# Patient Record
Sex: Male | Born: 1935 | Race: Black or African American | Hispanic: No | Smoking: Never smoker
Health system: Southern US, Community
[De-identification: ages and names within clinical notes are randomized; demographics above are authoritative.]

## PROBLEM LIST (undated history)

## (undated) DIAGNOSIS — N189 Chronic kidney disease, unspecified: Secondary | ICD-10-CM

## (undated) DIAGNOSIS — C7951 Secondary malignant neoplasm of bone: Secondary | ICD-10-CM

## (undated) DIAGNOSIS — E7800 Pure hypercholesterolemia, unspecified: Secondary | ICD-10-CM

## (undated) DIAGNOSIS — E079 Disorder of thyroid, unspecified: Secondary | ICD-10-CM

## (undated) DIAGNOSIS — I639 Cerebral infarction, unspecified: Secondary | ICD-10-CM

## (undated) DIAGNOSIS — G4730 Sleep apnea, unspecified: Secondary | ICD-10-CM

## (undated) DIAGNOSIS — K219 Gastro-esophageal reflux disease without esophagitis: Secondary | ICD-10-CM

## (undated) DIAGNOSIS — M1990 Unspecified osteoarthritis, unspecified site: Secondary | ICD-10-CM

## (undated) DIAGNOSIS — I10 Essential (primary) hypertension: Secondary | ICD-10-CM

## (undated) DIAGNOSIS — I509 Heart failure, unspecified: Secondary | ICD-10-CM

## (undated) DIAGNOSIS — G459 Transient cerebral ischemic attack, unspecified: Secondary | ICD-10-CM

## (undated) DIAGNOSIS — F0390 Unspecified dementia without behavioral disturbance: Secondary | ICD-10-CM

## (undated) DIAGNOSIS — E039 Hypothyroidism, unspecified: Secondary | ICD-10-CM

## (undated) DIAGNOSIS — Z8719 Personal history of other diseases of the digestive system: Secondary | ICD-10-CM

## (undated) DIAGNOSIS — C61 Malignant neoplasm of prostate: Secondary | ICD-10-CM

## (undated) DIAGNOSIS — R569 Unspecified convulsions: Secondary | ICD-10-CM

## (undated) HISTORY — PX: INGUINAL HERNIA REPAIR: SUR1180

## (undated) HISTORY — PX: TRANSURETHRAL RESECTION OF PROSTATE: SHX73

## (undated) HISTORY — DX: Disorder of thyroid, unspecified: E07.9

## (undated) HISTORY — DX: Essential (primary) hypertension: I10

## (undated) HISTORY — PX: COLONOSCOPY: SHX174

## (undated) HISTORY — PX: CERVICAL DISC SURGERY: SHX588

## (undated) HISTORY — PX: BACK SURGERY: SHX140

## (undated) HISTORY — PX: CATARACT EXTRACTION W/ INTRAOCULAR LENS  IMPLANT, BILATERAL: SHX1307

---

## 1999-01-16 ENCOUNTER — Encounter: Admission: RE | Admit: 1999-01-16 | Discharge: 1999-04-16 | Payer: Self-pay | Admitting: Radiation Oncology

## 2005-03-26 ENCOUNTER — Emergency Department (HOSPITAL_COMMUNITY): Admission: EM | Admit: 2005-03-26 | Discharge: 2005-03-27 | Payer: Self-pay | Admitting: Emergency Medicine

## 2006-11-07 ENCOUNTER — Ambulatory Visit: Payer: Self-pay | Admitting: Gastroenterology

## 2006-11-08 ENCOUNTER — Ambulatory Visit (HOSPITAL_COMMUNITY): Admission: RE | Admit: 2006-11-08 | Discharge: 2006-11-08 | Payer: Self-pay | Admitting: Gastroenterology

## 2007-03-06 ENCOUNTER — Encounter (INDEPENDENT_AMBULATORY_CARE_PROVIDER_SITE_OTHER): Payer: Self-pay | Admitting: Internal Medicine

## 2007-05-16 ENCOUNTER — Encounter (INDEPENDENT_AMBULATORY_CARE_PROVIDER_SITE_OTHER): Payer: Self-pay | Admitting: Internal Medicine

## 2007-06-25 ENCOUNTER — Telehealth (INDEPENDENT_AMBULATORY_CARE_PROVIDER_SITE_OTHER): Payer: Self-pay | Admitting: *Deleted

## 2007-06-25 ENCOUNTER — Ambulatory Visit: Payer: Self-pay | Admitting: Internal Medicine

## 2007-06-25 DIAGNOSIS — Z8546 Personal history of malignant neoplasm of prostate: Secondary | ICD-10-CM | POA: Insufficient documentation

## 2007-06-25 DIAGNOSIS — H269 Unspecified cataract: Secondary | ICD-10-CM

## 2007-06-25 DIAGNOSIS — J309 Allergic rhinitis, unspecified: Secondary | ICD-10-CM | POA: Insufficient documentation

## 2007-06-25 DIAGNOSIS — M129 Arthropathy, unspecified: Secondary | ICD-10-CM | POA: Insufficient documentation

## 2007-06-25 DIAGNOSIS — R569 Unspecified convulsions: Secondary | ICD-10-CM

## 2007-06-25 DIAGNOSIS — I10 Essential (primary) hypertension: Secondary | ICD-10-CM

## 2007-06-25 DIAGNOSIS — E039 Hypothyroidism, unspecified: Secondary | ICD-10-CM

## 2007-06-27 ENCOUNTER — Encounter (INDEPENDENT_AMBULATORY_CARE_PROVIDER_SITE_OTHER): Payer: Self-pay | Admitting: Internal Medicine

## 2007-07-08 ENCOUNTER — Encounter (INDEPENDENT_AMBULATORY_CARE_PROVIDER_SITE_OTHER): Payer: Self-pay | Admitting: Internal Medicine

## 2007-07-14 ENCOUNTER — Ambulatory Visit: Payer: Self-pay | Admitting: Internal Medicine

## 2007-08-08 ENCOUNTER — Ambulatory Visit (HOSPITAL_COMMUNITY): Admission: RE | Admit: 2007-08-08 | Discharge: 2007-08-08 | Payer: Self-pay | Admitting: Internal Medicine

## 2007-08-08 ENCOUNTER — Ambulatory Visit: Payer: Self-pay | Admitting: Internal Medicine

## 2007-08-08 DIAGNOSIS — R05 Cough: Secondary | ICD-10-CM

## 2007-08-12 ENCOUNTER — Encounter (INDEPENDENT_AMBULATORY_CARE_PROVIDER_SITE_OTHER): Payer: Self-pay | Admitting: Internal Medicine

## 2007-09-05 ENCOUNTER — Ambulatory Visit: Payer: Self-pay | Admitting: Internal Medicine

## 2007-10-17 ENCOUNTER — Ambulatory Visit: Payer: Self-pay | Admitting: Internal Medicine

## 2007-10-17 LAB — CONVERTED CEMR LAB
Bilirubin Urine: NEGATIVE
Blood in Urine, dipstick: NEGATIVE
Glucose, Urine, Semiquant: NEGATIVE
Protein, U semiquant: NEGATIVE
WBC Urine, dipstick: NEGATIVE
pH: 7

## 2007-10-19 LAB — CONVERTED CEMR LAB
CO2: 23 meq/L (ref 19–32)
Chloride: 106 meq/L (ref 96–112)
HDL: 43 mg/dL (ref >39)
LDL Cholesterol: 81 mg/dL (ref 0–99)
Potassium: 3.6 meq/L (ref 3.5–5.3)
Sodium: 143 meq/L (ref 135–145)
TSH: 0.103 microintl units/mL — ABNORMAL LOW (ref 0.350–5.50)
Total CHOL/HDL Ratio: 3.8
VLDL: 40 mg/dL (ref 0–40)

## 2007-12-10 ENCOUNTER — Encounter (INDEPENDENT_AMBULATORY_CARE_PROVIDER_SITE_OTHER): Payer: Self-pay | Admitting: Internal Medicine

## 2007-12-11 ENCOUNTER — Encounter (INDEPENDENT_AMBULATORY_CARE_PROVIDER_SITE_OTHER): Payer: Self-pay | Admitting: Internal Medicine

## 2007-12-19 ENCOUNTER — Encounter (INDEPENDENT_AMBULATORY_CARE_PROVIDER_SITE_OTHER): Payer: Self-pay | Admitting: Internal Medicine

## 2008-01-06 ENCOUNTER — Ambulatory Visit: Payer: Self-pay | Admitting: Internal Medicine

## 2008-01-06 DIAGNOSIS — L259 Unspecified contact dermatitis, unspecified cause: Secondary | ICD-10-CM

## 2008-02-25 ENCOUNTER — Ambulatory Visit: Payer: Self-pay | Admitting: Internal Medicine

## 2008-04-02 ENCOUNTER — Encounter (INDEPENDENT_AMBULATORY_CARE_PROVIDER_SITE_OTHER): Payer: Self-pay | Admitting: Internal Medicine

## 2008-05-25 ENCOUNTER — Ambulatory Visit: Payer: Self-pay | Admitting: Internal Medicine

## 2008-07-06 ENCOUNTER — Encounter (INDEPENDENT_AMBULATORY_CARE_PROVIDER_SITE_OTHER): Payer: Self-pay | Admitting: Internal Medicine

## 2008-08-24 ENCOUNTER — Ambulatory Visit: Payer: Self-pay | Admitting: Internal Medicine

## 2008-08-26 LAB — CONVERTED CEMR LAB
AST: 18 units/L (ref 0–37)
Albumin: 4.5 g/dL (ref 3.5–5.2)
Alkaline Phosphatase: 101 units/L (ref 39–117)
BUN: 14 mg/dL (ref 6–23)
Basophils Absolute: 0 10*3/uL (ref 0.0–0.1)
Basophils Relative: 1 % (ref 0–1)
HDL: 45 mg/dL (ref >39)
LDL Cholesterol: 86 mg/dL (ref 0–99)
Lymphocytes Relative: 45 % (ref 12–46)
Neutro Abs: 2.2 10*3/uL (ref 1.7–7.7)
Platelets: 183 10*3/uL (ref 150–400)
Potassium: 3.7 meq/L (ref 3.5–5.3)
RDW: 14 % (ref 11.5–15.5)
Sodium: 141 meq/L (ref 135–145)
TSH: 0.184 microintl units/mL — ABNORMAL LOW (ref 0.350–4.500)
Total Bilirubin: 0.8 mg/dL (ref 0.3–1.2)
VLDL: 34 mg/dL (ref 0–40)

## 2009-02-11 ENCOUNTER — Encounter (INDEPENDENT_AMBULATORY_CARE_PROVIDER_SITE_OTHER): Payer: Self-pay | Admitting: Internal Medicine

## 2009-09-04 ENCOUNTER — Emergency Department (HOSPITAL_COMMUNITY): Admission: EM | Admit: 2009-09-04 | Discharge: 2009-09-04 | Payer: Self-pay | Admitting: Emergency Medicine

## 2010-10-17 NOTE — Assessment & Plan Note (Signed)
NAME:  [name], [name]              CHART#:  [id number]   DATE:  11/07/2006                       DOB:  09-Nov-1920   REASON FOR VISIT:  Reported history of pancreatitis.   HISTORY OF PRESENT ILLNESS:  [name] is an 77 year old male who  reports having a history of pancreatitis in March of 2008 and was seen  and evaluated in the Emergency Department at [facility]  He is a self-referral and the records are unavailable to me. He had two  episodes of vomiting and went to the emergency department. It was  associated with abdominal pain. He has not had any additional problems.  He does not drink any alcohol. The lab values are not available to me.   He denies any problems vomiting, swallowing, heartburn, indigestion or  stomach pain on today. He occasionally has diarrhea. He denies any  constipation. He does have a gallbladder. His appetite is good and his  energy level is okay. His wife reports that he has problems with  dementia. He initially stated that he had x-rays done but then his wife  said he did not, but they agreed that he was diagnosed with  pancreatitis.   PAST MEDICAL HISTORY:  1. Hypertension.  2. Polyps.  3. Allergies.   PAST SURGICAL HISTORY:  1. Two hernia surgeries.  2. Back surgery.   ALLERGIES:  HE STATES THAT ASPIRIN CAUSES HIS HEART TO RACE.   MEDICATIONS:  1. Aspirin 81 mg a day.  2. Allegra as needed.  3. Norvasc 10 mg daily, but the pharmacy says he has not had this      filled since March of 2008.  4. Levoxyl 125 mcg daily, but the pharmacist says he has not had this      filled since March of 2008.   FAMILY HISTORY:  He denied any family history of colon cancer or colon  polyps, but during our visit came to understand that his brother is  [name] who was diagnosed with rectal cancer in 2008.   SOCIAL HISTORY:  He is retired and married. He does not smoke or drink  alcohol.   REVIEW OF SYSTEMS:  He reports having had  a colonoscopy by Dr.  Gabriel Cirri  approximately two years ago. His review of systems is per HPI and  otherwise all systems are negative.   PHYSICAL EXAMINATION:  Weight 199 pounds, height 5 feet, 11 inches,  temperature 97.5, blood pressure is 160/104, pulse 60.  In general, he is no apparent distress. He is alert and oriented x4.  HEENT: Atraumatic, normocephalic. Pupils equal and reactive to light.  Mouth with no oral lesions. Posterior pharynx without erythema or  exudates. NECK: Has full range of motion. No lymphadenopathy. LUNGS:  Clear to auscultation bilaterally. CARDIOVASCULAR: Regular rhythm. No  murmur. Normal S1 and S2. ABDOMEN: Bowel sounds are present and soft and  nontender, nondistended. No rebound and no guarding. He has no abdominal  bruits or pulsatile masses. EXTREMITIES: Are without cyanosis, clubbing  or edema.  NEURO: He has no focal neurologic deficits.   ASSESSMENT:  Mr. Parmelee is an 77 year old male with a reported history  of one episode of acute pancreatitis. He does have a gallbladder and  most likely etiology for his episode of pancreatitis is gallstone  pancreatitis.   PLAN:  1.  Will obtain CMP and a lipase when he comes to [facility] for an      abdominal ultrasound to evaluate for gallstones, or distal common      bile duct stone and any pancreatic mass.  2. He has a follow up appointment to see me in six weeks.       Kassie Mends, M.D.  Electronically Signed     SM/MEDQ  D:  11/07/2006  T:  11/07/2006  Job:  [id number]   cc:   [name], M.D.

## 2011-09-25 ENCOUNTER — Ambulatory Visit: Payer: PRIVATE HEALTH INSURANCE

## 2011-09-25 ENCOUNTER — Ambulatory Visit (INDEPENDENT_AMBULATORY_CARE_PROVIDER_SITE_OTHER): Payer: PRIVATE HEALTH INSURANCE

## 2011-09-25 ENCOUNTER — Encounter: Payer: Self-pay | Admitting: Orthopedic Surgery

## 2011-09-25 ENCOUNTER — Ambulatory Visit (INDEPENDENT_AMBULATORY_CARE_PROVIDER_SITE_OTHER): Payer: PRIVATE HEALTH INSURANCE | Admitting: Orthopedic Surgery

## 2011-09-25 VITALS — BP 112/62 | Ht 70.5 in | Wt 200.0 lb

## 2011-09-25 DIAGNOSIS — IMO0002 Reserved for concepts with insufficient information to code with codable children: Secondary | ICD-10-CM

## 2011-09-25 DIAGNOSIS — M79609 Pain in unspecified limb: Secondary | ICD-10-CM

## 2011-09-25 DIAGNOSIS — I739 Peripheral vascular disease, unspecified: Secondary | ICD-10-CM

## 2011-09-25 DIAGNOSIS — M25559 Pain in unspecified hip: Secondary | ICD-10-CM

## 2011-09-25 DIAGNOSIS — M5417 Radiculopathy, lumbosacral region: Secondary | ICD-10-CM

## 2011-09-25 DIAGNOSIS — M25552 Pain in left hip: Secondary | ICD-10-CM

## 2011-09-25 DIAGNOSIS — M169 Osteoarthritis of hip, unspecified: Secondary | ICD-10-CM

## 2011-09-25 DIAGNOSIS — M79606 Pain in leg, unspecified: Secondary | ICD-10-CM

## 2011-09-25 MED ORDER — PREDNISONE 10 MG PO KIT: 10.0000 mg | PACK | ORAL | Status: DC

## 2011-09-25 NOTE — Patient Instructions (Addendum)
Start medication as directed   Go to hospital for tests when scheduled   Diagnosis is unclear, possibilities are  1. Hip arthritis 2. Pinched nerve  3. Poor circulation

## 2011-09-26 ENCOUNTER — Encounter: Payer: Self-pay | Admitting: Orthopedic Surgery

## 2011-09-26 DIAGNOSIS — M79606 Pain in leg, unspecified: Secondary | ICD-10-CM | POA: Insufficient documentation

## 2011-09-26 DIAGNOSIS — M25559 Pain in unspecified hip: Secondary | ICD-10-CM | POA: Insufficient documentation

## 2011-09-26 DIAGNOSIS — M5417 Radiculopathy, lumbosacral region: Secondary | ICD-10-CM | POA: Insufficient documentation

## 2011-09-26 DIAGNOSIS — I739 Peripheral vascular disease, unspecified: Secondary | ICD-10-CM | POA: Insufficient documentation

## 2011-09-26 DIAGNOSIS — M169 Osteoarthritis of hip, unspecified: Secondary | ICD-10-CM | POA: Insufficient documentation

## 2011-09-26 DIAGNOSIS — M25552 Pain in left hip: Secondary | ICD-10-CM | POA: Insufficient documentation

## 2011-09-26 NOTE — Progress Notes (Signed)
  Subjective:    [name] is a 78 y.o. male who presents with ankle pain radiating up to his left groin. The pain has been present for over one year. The pain is moderate to severe was associated with ambulation. He also has some bilateral numbness and tingling in his lower extremities which is unrelated.  The symptoms started about a year ago and came on gradually. There was no associated injury. He basically is experiencing throbbing 10 out of 10 intermittent pain which is slightly improved with Aleve. He is noticed some swelling in his ankles as well. No previous treatment No previous symptoms prior to one year ago The following portions of the patient's history were reviewed and updated as appropriate: allergies, current medications, past family history, past medical history, past social history, past surgical history and problem list.   Review of Systems Pertinent items are noted in HPI.  other review of systems include snoring and cough, frequency, excessive urination and seasonal allergies  Objective:    BP 112/62  Ht 5' 10.5" (1.791 m)  Wt 200 lb (90.719 kg)  BMI 28.29 kg/m2 The patient's overall appearance is normal. He has normal body habitus and no deformity.  He is oriented x3. He is an excellent mood and affect. He is ambulatory without assistive devices and has mild detectable limp. Evaluation of his lower extremities show minor skin changes consistent with venous stasis disease. He has adequate dorsalis pedis and posterior tibial pulses to palpation. There is mild peripheral edema bilaterally. Lymph nodes are normal in both groins. He has normal sensation in both legs. He has no pathologic reflexes in either limb and his overall coordination is normal in both lower extremities at the heel shin maneuver.  He has normal range of motion both hips without pain or tenderness. His hips are stable strength is normal and there is no instability.  He has no back pain or back  tenderness.    Imaging: X-ray the left hip: Minor joint space narrowing is seen    Assessment:    Frontal diagnosis includes peripheral vascular disease, lumbar disc disease with neurogenic pain, arthritis.    Plan:    We will study his arterial brachial indices, we will start him a Dosepak.

## 2011-09-27 ENCOUNTER — Telehealth: Payer: Self-pay | Admitting: Radiology

## 2011-09-27 NOTE — Telephone Encounter (Signed)
I called and gave the patient his appointment for his ultrasound for his legs at [facility] on 10-01-11 at 12:45. Patient has an appointment to go over results back at our office.

## 2011-10-01 ENCOUNTER — Ambulatory Visit (HOSPITAL_COMMUNITY)
Admission: RE | Admit: 2011-10-01 | Discharge: 2011-10-01 | Disposition: A | Discharge status: Home / Self Care | Payer: Medicare Other | Source: Ambulatory Visit | Attending: Orthopedic Surgery | Admitting: Orthopedic Surgery

## 2011-10-01 DIAGNOSIS — M79609 Pain in unspecified limb: Secondary | ICD-10-CM | POA: Insufficient documentation

## 2011-10-01 DIAGNOSIS — I10 Essential (primary) hypertension: Secondary | ICD-10-CM | POA: Insufficient documentation

## 2011-10-01 DIAGNOSIS — M79606 Pain in leg, unspecified: Secondary | ICD-10-CM

## 2011-10-16 ENCOUNTER — Encounter: Payer: Self-pay | Admitting: Orthopedic Surgery

## 2011-10-16 ENCOUNTER — Ambulatory Visit (INDEPENDENT_AMBULATORY_CARE_PROVIDER_SITE_OTHER): Payer: Medicare Other | Admitting: Orthopedic Surgery

## 2011-10-16 VITALS — BP 110/60 | Ht 70.5 in | Wt 200.0 lb

## 2011-10-16 DIAGNOSIS — M51379 Other intervertebral disc degeneration, lumbosacral region without mention of lumbar back pain or lower extremity pain: Secondary | ICD-10-CM | POA: Insufficient documentation

## 2011-10-16 DIAGNOSIS — M5137 Other intervertebral disc degeneration, lumbosacral region: Secondary | ICD-10-CM | POA: Insufficient documentation

## 2011-10-16 MED ORDER — GABAPENTIN 100 MG PO CAPS: 100.0000 mg | ORAL_CAPSULE | Freq: Every day | ORAL | Status: DC

## 2011-10-16 NOTE — Patient Instructions (Signed)
Take gabapentin 100 mg at [facility] when needed

## 2011-10-16 NOTE — Progress Notes (Signed)
Patient ID: [name], male   DOB: 15-Feb-1921, 78 y.o.   MRN: [id number] Chief Complaint  Patient presents with  . Follow-up    3 week recheck on leg pain.   S/P abi test which was normal   His symptoms are intermittent. At this time, if he's not really any better after the prednisone. No worse. He has days where the pain comes on days, when it's not present at all.  Recommend gabapentin 100 mg h.s. As needed.  Follow up as needed.  Most likely diagnosis lumbar degenerative disease with occasional radicular pain.

## 2012-03-02 ENCOUNTER — Encounter (HOSPITAL_COMMUNITY): Payer: Self-pay | Admitting: Emergency Medicine

## 2012-03-02 ENCOUNTER — Emergency Department (HOSPITAL_COMMUNITY): Payer: Medicare Other

## 2012-03-02 ENCOUNTER — Observation Stay (HOSPITAL_COMMUNITY)
Admission: EM | Admit: 2012-03-02 | Discharge: 2012-03-05 | DRG: 069 | Disposition: A | Discharge status: Home / Self Care | Payer: Medicare Other | Attending: Family Medicine | Admitting: Family Medicine

## 2012-03-02 DIAGNOSIS — M5137 Other intervertebral disc degeneration, lumbosacral region: Secondary | ICD-10-CM

## 2012-03-02 DIAGNOSIS — R4182 Altered mental status, unspecified: Principal | ICD-10-CM | POA: Insufficient documentation

## 2012-03-02 DIAGNOSIS — Z23 Encounter for immunization: Secondary | ICD-10-CM

## 2012-03-02 DIAGNOSIS — Z79899 Other long term (current) drug therapy: Secondary | ICD-10-CM

## 2012-03-02 DIAGNOSIS — G459 Transient cerebral ischemic attack, unspecified: Secondary | ICD-10-CM

## 2012-03-02 DIAGNOSIS — I6529 Occlusion and stenosis of unspecified carotid artery: Secondary | ICD-10-CM | POA: Diagnosis present

## 2012-03-02 DIAGNOSIS — Z8546 Personal history of malignant neoplasm of prostate: Secondary | ICD-10-CM

## 2012-03-02 DIAGNOSIS — Z7982 Long term (current) use of aspirin: Secondary | ICD-10-CM

## 2012-03-02 DIAGNOSIS — F0150 Vascular dementia without behavioral disturbance: Secondary | ICD-10-CM | POA: Diagnosis present

## 2012-03-02 DIAGNOSIS — D649 Anemia, unspecified: Secondary | ICD-10-CM | POA: Diagnosis present

## 2012-03-02 DIAGNOSIS — I129 Hypertensive chronic kidney disease with stage 1 through stage 4 chronic kidney disease, or unspecified chronic kidney disease: Secondary | ICD-10-CM | POA: Diagnosis present

## 2012-03-02 DIAGNOSIS — I658 Occlusion and stenosis of other precerebral arteries: Secondary | ICD-10-CM | POA: Diagnosis present

## 2012-03-02 DIAGNOSIS — Z8673 Personal history of transient ischemic attack (TIA), and cerebral infarction without residual deficits: Secondary | ICD-10-CM

## 2012-03-02 DIAGNOSIS — I672 Cerebral atherosclerosis: Secondary | ICD-10-CM | POA: Diagnosis present

## 2012-03-02 DIAGNOSIS — E039 Hypothyroidism, unspecified: Secondary | ICD-10-CM | POA: Diagnosis present

## 2012-03-02 DIAGNOSIS — N189 Chronic kidney disease, unspecified: Secondary | ICD-10-CM | POA: Diagnosis present

## 2012-03-02 DIAGNOSIS — R945 Abnormal results of liver function studies: Secondary | ICD-10-CM | POA: Diagnosis present

## 2012-03-02 LAB — CBC WITH DIFFERENTIAL/PLATELET
Basophils Absolute: 0.1 10*3/uL (ref 0.0–0.1)
Basophils Relative: 1 % (ref 0–1)
Eosinophils Absolute: 0.3 10*3/uL (ref 0.0–0.7)
Eosinophils Relative: 4 % (ref 0–5)
HCT: 34.8 % — ABNORMAL LOW (ref 39.0–52.0)
Lymphocytes Relative: 32 % (ref 12–46)
MCH: 28.3 pg (ref 26.0–34.0)
MCHC: 33 g/dL (ref 30.0–36.0)
MCV: 85.7 fL (ref 78.0–100.0)
Monocytes Absolute: 0.6 10*3/uL (ref 0.1–1.0)
Platelets: 156 10*3/uL (ref 150–400)
RDW: 14 % (ref 11.5–15.5)
WBC: 7.8 10*3/uL (ref 4.0–10.5)

## 2012-03-02 LAB — RAPID URINE DRUG SCREEN, HOSP PERFORMED
Barbiturates: NOT DETECTED
Benzodiazepines: NOT DETECTED
Cocaine: NOT DETECTED
Opiates: NOT DETECTED
Tetrahydrocannabinol: NOT DETECTED

## 2012-03-02 LAB — COMPREHENSIVE METABOLIC PANEL
ALT: 60 U/L — ABNORMAL HIGH (ref 0–53)
AST: 167 U/L — ABNORMAL HIGH (ref 0–37)
Albumin: 4.4 g/dL (ref 3.5–5.2)
Calcium: 9.9 mg/dL (ref 8.4–10.5)
Creatinine, Ser: 1.99 mg/dL — ABNORMAL HIGH (ref 0.50–1.35)
GFR calc non Af Amer: 28 mL/min — ABNORMAL LOW (ref >90)
Sodium: 137 mEq/L (ref 135–145)
Total Protein: 7.7 g/dL (ref 6.0–8.3)

## 2012-03-02 LAB — URINALYSIS, ROUTINE W REFLEX MICROSCOPIC
Bilirubin Urine: NEGATIVE
Ketones, ur: NEGATIVE mg/dL
Nitrite: NEGATIVE
Specific Gravity, Urine: 1.02 (ref 1.005–1.030)
Urobilinogen, UA: 0.2 mg/dL (ref 0.0–1.0)

## 2012-03-02 LAB — LIPID PANEL
Cholesterol: 132 mg/dL (ref 0–200)
LDL Cholesterol: 72 mg/dL (ref 0–99)
Total CHOL/HDL Ratio: 3.1 RATIO
VLDL: 18 mg/dL (ref 0–40)

## 2012-03-02 LAB — URINE MICROSCOPIC-ADD ON

## 2012-03-02 MED ORDER — SODIUM CHLORIDE 0.9 % IV SOLN: 250.0000 mL | INTRAVENOUS | Status: DC | PRN

## 2012-03-02 MED ORDER — ONDANSETRON HCL 4 MG PO TABS: 4.0000 mg | ORAL_TABLET | Freq: Four times a day (QID) | ORAL | Status: DC | PRN

## 2012-03-02 MED ORDER — ASPIRIN 81 MG PO CHEW: 81.0000 mg | CHEWABLE_TABLET | Freq: Every day | ORAL | Status: DC

## 2012-03-02 MED ORDER — MEMANTINE HCL 10 MG PO TABS
10.0000 mg | ORAL_TABLET | Freq: Two times a day (BID) | ORAL | Status: DC
  Administered 2012-03-03 – 2012-03-05 (×6): 10 mg via ORAL
  Filled 2012-03-02 (×10): qty 1

## 2012-03-02 MED ORDER — ONDANSETRON HCL 4 MG/2ML IJ SOLN: 4.0000 mg | Freq: Four times a day (QID) | INTRAMUSCULAR | Status: DC | PRN

## 2012-03-02 MED ORDER — LEVOTHYROXINE SODIUM 112 MCG PO TABS
112.0000 ug | ORAL_TABLET | Freq: Every day | ORAL | Status: DC
  Administered 2012-03-04 – 2012-03-05 (×2): 112 ug via ORAL
  Filled 2012-03-02 (×5): qty 1

## 2012-03-02 MED ORDER — ACETAMINOPHEN 325 MG PO TABS: 650.0000 mg | ORAL_TABLET | Freq: Four times a day (QID) | ORAL | Status: DC | PRN

## 2012-03-02 MED ORDER — LORAZEPAM 1 MG PO TABS
2.0000 mg | ORAL_TABLET | Freq: Two times a day (BID) | ORAL | Status: DC
  Administered 2012-03-03 – 2012-03-05 (×6): 2 mg via ORAL
  Filled 2012-03-02 (×6): qty 2

## 2012-03-02 MED ORDER — LORATADINE 10 MG PO TABS
10.0000 mg | ORAL_TABLET | Freq: Every day | ORAL | Status: DC
  Administered 2012-03-03 – 2012-03-05 (×3): 10 mg via ORAL
  Filled 2012-03-02 (×3): qty 1

## 2012-03-02 MED ORDER — SODIUM CHLORIDE 0.9 % IJ SOLN: 3.0000 mL | INTRAMUSCULAR | Status: DC | PRN

## 2012-03-02 MED ORDER — AMLODIPINE BESYLATE 5 MG PO TABS
10.0000 mg | ORAL_TABLET | Freq: Every day | ORAL | Status: DC
  Administered 2012-03-03 – 2012-03-05 (×4): 10 mg via ORAL
  Filled 2012-03-02 (×4): qty 2

## 2012-03-02 MED ORDER — CLOPIDOGREL BISULFATE 75 MG PO TABS
75.0000 mg | ORAL_TABLET | Freq: Every day | ORAL | Status: DC
  Administered 2012-03-03 – 2012-03-05 (×3): 75 mg via ORAL
  Filled 2012-03-02 (×3): qty 1

## 2012-03-02 MED ORDER — SODIUM CHLORIDE 0.9 % IV BOLUS (SEPSIS)
500.0000 mL | Freq: Once | INTRAVENOUS | Status: AC
  Administered 2012-03-02: 500 mL via INTRAVENOUS

## 2012-03-02 MED ORDER — ENOXAPARIN SODIUM 30 MG/0.3ML ~~LOC~~ SOLN
30.0000 mg | SUBCUTANEOUS | Status: DC
  Administered 2012-03-03 – 2012-03-04 (×3): 30 mg via SUBCUTANEOUS
  Filled 2012-03-02 (×3): qty 0.3

## 2012-03-02 MED ORDER — SODIUM CHLORIDE 0.9 % IJ SOLN
3.0000 mL | Freq: Two times a day (BID) | INTRAMUSCULAR | Status: DC
  Administered 2012-03-03 – 2012-03-04 (×5): 3 mL via INTRAVENOUS
  Filled 2012-03-02 (×5): qty 3

## 2012-03-02 MED ORDER — ALUM & MAG HYDROXIDE-SIMETH 200-200-20 MG/5ML PO SUSP
30.0000 mL | Freq: Four times a day (QID) | ORAL | Status: DC | PRN
Start: 2012-03-02 — End: 2012-03-05

## 2012-03-02 MED ORDER — GABAPENTIN 100 MG PO CAPS
100.0000 mg | ORAL_CAPSULE | Freq: Every day | ORAL | Status: DC
  Administered 2012-03-03 – 2012-03-04 (×3): 100 mg via ORAL
  Filled 2012-03-02 (×5): qty 1

## 2012-03-02 MED ORDER — ACETAMINOPHEN 650 MG RE SUPP: 650.0000 mg | Freq: Four times a day (QID) | RECTAL | Status: DC | PRN

## 2012-03-02 NOTE — ED Notes (Signed)
Pt wife reports pt has been increasingly confused and mumbling since yesterday. Pt a and o x 3. Face symmetrical/equal grips/speech clear. Pt wife states pt is "acting like he does when he has seizures".

## 2012-03-02 NOTE — ED Provider Notes (Signed)
History     CSN: [id number]  Arrival date & time 03/02/12  4098   First MD Initiated Contact with Patient 03/02/12 1849      Chief Complaint  Patient presents with  . Altered Mental Status    (Consider location/radiation/quality/duration/timing/severity/associated sxs/prior treatment) HPI Patient with history a "mini stroke" in past but normally functional with driving.  Stung by bees on Friday and worse with increasing confusion since yesterday.  Patient ambulatory without problem, no focal deficits, mumbling and incoherent but answers wife's questions.  Patient in early stages of Alzheimer's per pmd Dr. Sudie Bailey. Past Medical History  Diagnosis Date  . HTN (hypertension)   . Thyroid disease   . Cancer     Past Surgical History  Procedure Date  . Hernia repair     x 2  . Prostate surgery   . Back surgery     cervical spine    Family History  Problem Relation Age of Onset  . Arthritis    . Cancer    . Asthma    . Diabetes      History  Substance Use Topics  . Smoking status: Never Smoker   . Smokeless tobacco: Not on file  . Alcohol Use: No      Review of Systems  Constitutional: Positive for chills and activity change. Negative for fever.  HENT: Negative for neck stiffness.   Eyes: Negative for visual disturbance.  Respiratory: Negative for shortness of breath.   Cardiovascular: Negative for chest pain.  Gastrointestinal: Negative for vomiting, diarrhea and blood in stool.  Genitourinary: Negative for dysuria, frequency and decreased urine volume.  Musculoskeletal: Negative for myalgias and joint swelling.  Skin: Negative for rash.  Neurological: Negative for weakness.  Hematological: Negative for adenopathy.  Psychiatric/Behavioral: Negative for agitation.    Allergies  Aspirin  Home Medications   Current Outpatient Rx  Name Route Sig Dispense Refill  . AMLODIPINE BESYLATE 10 MG PO TABS Oral Take 10 mg by mouth daily.    . ASPIRIN 81 MG PO  CHEW Oral Chew 81 mg by mouth daily.    Marland Kitchen GABAPENTIN 100 MG PO CAPS Oral Take 1 capsule (100 mg total) by mouth at bedtime. 90 capsule 2  . LEVOTHYROXINE SODIUM 112 MCG PO TABS Oral Take 112 mcg by mouth daily.    Marland Kitchen LORATADINE 10 MG PO TABS Oral Take 10 mg by mouth daily.    Marland Kitchen LORAZEPAM 2 MG PO TABS Oral Take 2 mg by mouth 2 (two) times daily.     Marland Kitchen MEMANTINE HCL 10 MG PO TABS Oral Take 10 mg by mouth 2 (two) times daily.    Marland Kitchen PREDNISONE 10 MG PO KIT Oral Take 1 kit (10 mg total) by mouth as directed. 48 each 1    There were no vitals taken for this visit.  Physical Exam  Nursing note and vitals reviewed. Constitutional: He appears well-developed and well-nourished.  HENT:  Head: Normocephalic and atraumatic.  Eyes: Conjunctivae normal and EOM are normal. Pupils are equal, round, and reactive to light.  Neck: Normal range of motion. Neck supple.  Cardiovascular: Normal rate and regular rhythm.   Pulmonary/Chest: Effort normal and breath sounds normal.  Abdominal: Soft. Bowel sounds are normal.  Musculoskeletal: Normal range of motion.  Neurological: He is alert.       He should not oriented to place or age. He does not have any palmar drift. Strength is 5 out of 5 in bilateral lower extremities. Gait is  shuffling in nature.  Skin: Skin is warm.    ED Course  Procedures (including critical care time)  Labs Reviewed  GLUCOSE, CAPILLARY - Abnormal; Notable for the following:    Glucose-Capillary 140 (*)     All other components within normal limits   No results found.   No diagnosis found.    MDM  Patient with acute change in mental status. No obvious infarct seen on head CT. Urinalysis is still pending. I discussed patient's care with Dr. Ouida Sills.     Hilario Quarry, MD 03/02/12 2137

## 2012-03-02 NOTE — ED Notes (Signed)
Patient lying on stretcher.  Admitting MD at bedside.  Family members at bedside.

## 2012-03-02 NOTE — ED Notes (Signed)
Report given to Misty Stanley, RN on Unit 300.  Patient transported to room 324 on telemetry.

## 2012-03-03 ENCOUNTER — Inpatient Hospital Stay (HOSPITAL_COMMUNITY): Payer: Medicare Other

## 2012-03-03 MED ORDER — INFLUENZA VIRUS VACC SPLIT PF IM SUSP
0.5000 mL | INTRAMUSCULAR | Status: AC
  Administered 2012-03-04: 0.5 mL via INTRAMUSCULAR
  Filled 2012-03-03: qty 0.5

## 2012-03-03 NOTE — Care Management Note (Signed)
    Page 1 of 1   03/05/2012     10:38:58 AM   CARE MANAGEMENT NOTE 03/05/2012  Patient:  [name], [name]   Account Number:  [id number]  Date Initiated:  03/03/2012  Documentation initiated by:  Sharrie Rothman  Subjective/Objective Assessment:   Pt admitted from home with slurred speech and possible CVA. Pt lives with wife and will return home with wife at discharge. Pt is independent with ADL's.     Action/Plan:   No CM or HH needs noted. Will continue to follow.   Anticipated DC Date:  03/05/2012   Anticipated DC Plan:  HOME/SELF CARE      DC Planning Services  CM consult      Choice offered to / List presented to:             Status of service:  Completed, signed off Medicare Important Message given?  YES (If response is "NO", the following Medicare IM given date fields will be blank) Date Medicare IM given:  03/05/2012 Date Additional Medicare IM given:    Discharge Disposition:  HOME/SELF CARE  Per UR Regulation:    If discussed at Long Length of Stay Meetings, dates discussed:    Comments:  03/05/12 1038 Arlyss Queen, RN BSN CM Pt discharged home today. Pt has no CM or Hh needs.  03/03/12 1532 Arlyss Queen, RN BSN CM

## 2012-03-03 NOTE — H&P (Signed)
[name], [name]             ACCOUNT NO.:  [id number]  MEDICAL RECORD NO.:  [id number]  LOCATION:  A324                          FACILITY:  [facility]  PHYSICIAN:  Kingsley Callander. Ouida Sills, MD       DATE OF BIRTH:  [birth year]  DATE OF ADMISSION:  03/02/2012 DATE OF DISCHARGE:  LH                             HISTORY & PHYSICAL   CHIEF COMPLAINT:  Difficulty walking and difficulty speaking.  HISTORY OF PRESENT ILLNESS:  This patient is an 78 year old [name] with a history of mini strokes and dementia, who presented to the emergency room 1 day after developing altered speech with lot of mumbling.  He also is not walking nearly as well.  There has been no known fall or head trauma.  The patient was initially evaluated in the emergency room where CT scan of the brain revealed no evidence of acute abnormality.  He has no history of atrial fibrillation.  He has had hypertension.  He has not required treatment for diabetes or hyperlipidemia.  He does not smoke.  PAST MEDICAL HISTORY:  Hypertension, hypothyroidism, prostate cancer, dementia, "mini strokes."  MEDICATIONS: 1. Amlodipine 10 mg daily. 2. Aspirin 81 mg daily. 3. Gabapentin 100 mg at bedtime. 4. Levothyroxine 112 mcg daily. 5. Claritin 10 mg daily. 6. Ativan 2 mg b.i.d. 7. Namenda 10 mg b.i.d.  ALLERGIES:  None.  Chart lists ASPIRIN but he has been taking this long- term and is not allergic to it.  SOCIAL HISTORY:  He does not smoke or drink or use drugs.  FAMILY HISTORY:  Noncontributory.  REVIEW OF SYSTEMS:  Reveals no fever, chills, loss of consciousness, vomiting, diarrhea, chest pain, abdominal pain, or change in voiding pattern.  PHYSICAL EXAMINATION:  VITAL SIGNS:  Temperature 98.1, pulse 72, respirations 18, blood pressure 122/70. GENERAL:  Alert, no distress with garbled speech. HEENT:  Pupils are equal and reactive.  He has post cataract surgery changes.  Extraocular movements are intact.  Nose and  oropharynx are unremarkable.  There is no facial droop. NECK:  Supple with no carotid bruits.  No thyromegaly. LUNGS:  Clear. HEART:  Regular with no murmurs. ABDOMEN:  Soft, nondistended, and nontender with no hepatosplenomegaly. EXTREMITIES:  Reveal no clubbing or edema. NEURO:  No focal weakness in the upper or lower extremities.  His speech is significantly altered.  There is no facial droop.  His tongue is midline. LYMPH NODES:  No cervical or supraclavicular enlargement.  LABORATORY DATA:  Sodium 137, potassium 3.3, bicarb 23, BUN 31, creatinine 1.99.  AST 167, ALT 60.  Troponin I less than 0.30.  White count 7.8, hemoglobin 11.5, platelets 156,000.  Urinalysis reveals 3-6 white cells, 3-6 red cells.  CT scan of the brain reveals no acute abnormality.  He has atrophy and chronic microvascular ischemic changes. His EKG reveals normal sinus rhythm with first degree AV block and a right bundle branch block at 65 beats per minute.  IMPRESSION/PLAN: 1. Stroke.  Even though his CT scan shows no acute infarction, he     clearly has speech changes and reportedly has gait changes.  He     will be evaluated further with an MRI of  the brain.  He will be     observed in the telemetry setting.  Plavix will be added to     aspirin.  A lipid profile will be obtained.  Continue     antihypertensive therapy. 2. Prostate cancer. 3. Chronic kidney disease. 4. Abnormal liver function studies. 5. Normocytic anemia. 6. Hypothyroidism.  Continue levothyroxine.     Kingsley Callander. Ouida Sills, MD     ROF/MEDQ  D:  03/03/2012  T:  03/03/2012  Job:  [id number]

## 2012-03-04 ENCOUNTER — Inpatient Hospital Stay (HOSPITAL_COMMUNITY): Payer: Medicare Other

## 2012-03-04 NOTE — Progress Notes (Signed)
UR Chart Review Completed  

## 2012-03-04 NOTE — Progress Notes (Signed)
NAMEMONT, JAGODA             ACCOUNT NO.:  [id number]  MEDICAL RECORD NO.:  [id number]  LOCATION:  A324                          FACILITY:  [facility]  PHYSICIAN:  Mila Homer. Sudie Bailey, M.D.DATE OF BIRTH:  [birth year]  DATE OF PROCEDURE: DATE OF DISCHARGE:                                PROGRESS NOTE   SUBJECTIVE:  This 78 year old was at church yesterday singing when all of the sudden family noticed he was unable to sing any words although he was moving his mouth.  He was able to walk out of the church and family brought him to the emergency room, where he was unable to talk.  He tells me now that yesterday was a fog.  He knew he was there but could not really communicate but that has changed since then.  Admission history and physical, done by Dr. Ouida Sills, note that he was having a problem, his walking was different than normal.  OBJECTIVE:  VITAL SIGNS:  Temperature is 98.3, pulse 75 respiratory 20 blood pressure 120/70. GENERAL:  Sitting up in a chair.  Speech was normal.  Sensorium appears to be intact and his memory for recent events is good. HEART:  Regular rhythm and rate of about 70. LUNGS:  Appeared clear throughout moving air well.  He is moving his extremities well.  ASSESSMENT:  Transient ischemic attack.  PLAN:  He is due for an MRI of the brain today.  Discussed this with the patient and his wife and other family members.     Mila Homer. Sudie Bailey, M.D.     SDK/MEDQ  D:  03/03/2012  T:  03/04/2012  Job:  [id number]

## 2012-03-05 LAB — VITAMIN B12: Vitamin B-12: 505 pg/mL (ref 211–911)

## 2012-03-05 LAB — TSH: TSH: 3.339 u[IU]/mL (ref 0.350–4.500)

## 2012-03-05 MED ORDER — CLOPIDOGREL BISULFATE 75 MG PO TABS: 75.0000 mg | ORAL_TABLET | Freq: Every day | ORAL | Status: DC

## 2012-03-05 NOTE — Consult Note (Signed)
[facility] [name] A. Todd Pilgrim, MD     [email or web address]        Reason for Consult: Referring Physician:   [name] is an 78 y.o. male.  HPI:  Past Medical History  Diagnosis Date  . HTN (hypertension)   . Thyroid disease   . Cancer     Past Surgical History  Procedure Date  . Hernia repair     x 2  . Prostate surgery   . Back surgery     cervical spine    Family History  Problem Relation Age of Onset  . Arthritis    . Cancer    . Asthma    . Diabetes      Social History:  reports that he has never smoked. He does not have any smokeless tobacco history on file. He reports that he does not drink alcohol or use illicit drugs.  Allergies:  Allergies  Allergen Reactions  . Aspirin     REACTION: "elevated pulse"    Medications:  Prior to Admission medications   Medication Sig Start Date End Date Taking? Authorizing Provider  amLODipine (NORVASC) 10 MG tablet Take 10 mg by mouth daily.   Yes Historical Provider, MD  gabapentin (NEURONTIN) 100 MG capsule Take 1 capsule (100 mg total) by mouth at bedtime. 10/16/11 10/15/12 Yes Todd Hearing, MD  levothyroxine (SYNTHROID, LEVOTHROID) 112 MCG tablet Take 112 mcg by mouth daily.   Yes Historical Provider, MD  loratadine (CLARITIN) 10 MG tablet Take 10 mg by mouth daily.   Yes Historical Provider, MD  LORazepam (ATIVAN) 2 MG tablet Take 2 mg by mouth 2 (two) times daily.    Yes Historical Provider, MD  memantine (NAMENDA) 10 MG tablet Take 10 mg by mouth 2 (two) times daily.   Yes Historical Provider, MD  aspirin 81 MG chewable tablet Chew 81 mg by mouth daily.    Historical Provider, MD    Scheduled Meds:   . amLODipine  10 mg Oral Daily  . clopidogrel  75 mg Oral Q breakfast  . enoxaparin (LOVENOX) injection  30 mg Subcutaneous Q24H  . gabapentin  100 mg Oral QHS  . influenza  inactive virus vaccine  0.5 mL Intramuscular Tomorrow-1000  . levothyroxine  112 mcg Oral Daily  . loratadine  10 mg  Oral Daily  . LORazepam  2 mg Oral BID  . memantine  10 mg Oral BID  . sodium chloride  3 mL Intravenous Q12H   Continuous Infusions:  PRN Meds:.sodium chloride, acetaminophen, acetaminophen, alum & mag hydroxide-simeth, ondansetron (ZOFRAN) IV, ondansetron, sodium chloride   No results found for this or any previous visit (from the past 48 hour(s)).  Mr Brain Wo Contrast  03/03/2012  *RADIOLOGY REPORT*  Clinical Data: 78 year old male confusion, weakness, seizure yesterday.  MRI HEAD WITHOUT CONTRAST  Technique:  Multiplanar, multiecho pulse sequences of the brain and surrounding structures were obtained according to standard protocol without intravenous contrast.  Comparison: Head CT 03/02/2012.  [facility] brain MRI 07/25/2006.  Findings: No restricted diffusion to suggest acute infarction.  Chronic-appearing micro hemorrhage in the right brainstem, seems to be new since 2008.  Scattered chronic micro hemorrhages elsewhere. Other underlying chronic small vessel disease with unchanged chronic left thalamic lacunar infarct.  Chronic right thalamic lacuna is new since 2008.  Patchy cerebral white matter and brain stem T2 and FLAIR hyperintensity.  No areas of cortical encephalomalacia identified.  No ventriculomegaly. No midline shift, mass effect, or evidence  of mass lesion.  Stable pituitary and cervicomedullary junction. Chronic degenerative changes partially visible in the upper cervical spine. No marrow edema or evidence of acute osseous abnormality.  Postoperative changes to the globes.  Similar mild paranasal sinus mucosal thickening.  Mastoids are clear.  Negative scalp soft tissues.  IMPRESSION: 1. No acute intracranial abnormality. 2.  Progression of chronic small vessel disease since 2008.   Original Report Authenticated By: [name], M.D.    US Carotid Duplex Bilateral  03/04/2012  *RADIOLOGY REPORT*  Clinical Data: TIA.  Hypertension, tobacco use.  BILATERAL CAROTID  DUPLEX ULTRASOUND  Technique: Wallace Gregory scale imaging, color Doppler and duplex ultrasound was performed of bilateral carotid and vertebral arteries in the neck.  Comparison: None available  Criteria:  Quantification of carotid stenosis is based on velocity parameters that correlate the residual internal carotid diameter with NASCET-based stenosis levels, using the diameter of the distal internal carotid lumen as the denominator for stenosis measurement.  The following velocity measurements were obtained:                   PEAK SYSTOLIC/END DIASTOLIC RIGHT ICA:                        38/5cm/sec CCA:                        67/5cm/sec SYSTOLIC ICA/CCA RATIO:     0.57 DIASTOLIC ICA/CCA RATIO:    1.0 ECA:                        58cm/sec  LEFT ICA:                        46/10cm/sec CCA:                        70/11cm/sec SYSTOLIC ICA/CCA RATIO:     0.66 DIASTOLIC ICA/CCA RATIO:    0.90 ECA:                        32cm/sec  Findings:  RIGHT CAROTID ARTERY: Mild diffuse intimal thickening.  There is some mild plaque in the carotid bulb and proximal ICA without significant stenosis.  Normal wave forms and color Doppler signal. There is a high bifurcation.  RIGHT VERTEBRAL ARTERY:  Normal flow direction and waveform.  LEFT CAROTID ARTERY: There is more extensive intimal thickening through the common carotid artery with patchy areas of calcification but no significant stenosis.  There is eccentric plaque in the carotid bulb and proximal ICA without significant stenosis.  There is a high bifurcation.  Normal wave forms and color Doppler signal.  LEFT VERTEBRAL ARTERY:  Normal flow direction and waveform.  IMPRESSION:  1.  Mild bilateral carotid bifurcation and proximal ICA plaque, left greater than right, resulting in less than 50% diameter stenosis. The exam does not exclude plaque ulceration or embolization.  Continued surveillance recommended.   Original Report Authenticated By: Osa Craver, M.D.     Review of  Systems  Constitutional: Negative.   Eyes: Negative.   Respiratory: Negative.   Cardiovascular: Negative.   Gastrointestinal: Negative.   Musculoskeletal: Negative.   Skin: Negative.   Neurological: Positive for speech change and headaches. Negative for focal weakness.   Blood pressure 119/71, pulse 65, temperature 98.3 F (36.8 C), temperature source Oral,  resp. rate 18, height 5\' 11"  (1.803 m), weight 89.4 kg (197 lb 1.5 oz), SpO2 98.00%. Physical Exam  Assessment/Plan: Episodic dysarthria and confusion. Suspect seizures.  Fu 1 week in office. EEG. Plavix or aspirin fine.   ASSESSMENT/PLAN:  1. Episodes of speech arrest/dysarthria and altered mental status. The etiology is unclear but could represent nonconvulsive seizures. He apparently has had spells of shaking in the past. Other possibility includes TIA. He has been switched to Plavix which I think is appropriate. The patient will be arranged for a EEG as soon as possible. This can be done in outpatient setting however. He will follow-up in the office next week. 2. Likely mild vascular dementia.  Dementia labs will be obtained however. 3. Multiple bilateral lacunar infarcts that are old. SUBJECTIVE: The patient is an 78 year old white male who was sitting in a church when he probably was noted not to have any verbal output. The patient himself was somewhat not aware of this. He thought he was singing. Appears that he was motionless has been nothing was coming out. It is unclear how long this event lasted. He went back to church afterwards and started singing again. The symptoms happened. This time the event was witnessed by his wife. She reports that he had what appears to be dysarthria and also speech arrest. This lasted for several hours. He was taken to the emergency room for further evaluation. He had labs and CT scan which showed nothing acute. He has been maintained on aspirin but was socially switched to Plavix. Wife reports that  he has had some mild memory impairment and apparently has been diagnosed with possible Alzheimer's dementia. He probably saw Dr. Ninetta Lights the previous [facility] neurologist for some of his symptoms. He was told that he had mini strokes and possible seizures. The patient was having spells where he bent backwards and started shaking. He had multiple of these events. She reports that the neurologist placed him on a medication which apparently halted these events. It is unclear what medication this is but he is on Neurontin and Namenda. She intimated that these medications are being used for these events.  OBJECTIVE: GENERAL: The patient is in no acute distress.  HEENT: Retro-palatal space fine.    ABDOMEN: soft  EXTREMITIES: No edema   BACK: Unremarkable.  SKIN: Normal by inspection.    MENTAL STATUS: Alert and oriented. Speech, language and cognition are generally intact. Judgment and insight are normal.   CRANIAL NERVES: Pupils are equal, round and reactive to light and accomodation; extra ocular movements are full, there is no significant nystagmus; visual fields are full; upper and lower facial muscles are normal in strength and symmetric, there is no flattening of the nasolabial folds; tongue is midline; uvula is midline; shoulder elevation is normal.  MOTOR: There is mild atrophy of the first dorsal interosseous muscle. There is also contraction of the right hand. He reports that these deformities and findings occurred after cervical spine surgery and disc disease. Other muscle groups of the right upper extremity are normal. The other extremities show normal tone, bulk and strength.  COORDINATION: Left finger to nose is normal, right finger to nose is normal, No rest tremor; no intention tremor; no postural tremor; no bradykinesia.  REFLEXES: Deep tendon reflexes are symmetrical and normal. Babinski reflexes are flexor bilaterally.   SENSATION: Normal to light touch, temperature, and  pinprick.  The brain MRI is reviewed in person and shows nothing acute on FLAIR imaging. There  are several  lacunar infarcts seen in the medial aspect of the thalami bilaterally, right basilar ganglia and bilateral pontine areas. Nothing acute is seen. There are no cortical infarcts. There is moderate confluent deep white matter leukoencephalopathy. Review of the chordal cox does not show significant mesial temporal atrophy typical of Alzheimer's dementia.   Todd Beams, MD Diplomate, American Board of Psychiatry and Neurology (Neurology).   [name] 03/05/2012, 8:21 AM

## 2012-03-05 NOTE — Progress Notes (Signed)
[name], [name]             ACCOUNT NO.:  [id number]  MEDICAL RECORD NO.:  [id number]  LOCATION:  A324                          FACILITY:  [facility]  PHYSICIAN:  Mila Homer. Sudie Bailey, M.D.DATE OF BIRTH:  1920/12/30  DATE OF PROCEDURE: DATE OF DISCHARGE:                                PROGRESS NOTE   SUBJECTIVE:  He feels better today.  OBJECTIVE:  Temperature is 97.8, pulse 80, respiratory rate 20, blood pressure 151/76.  He is walking around the room.  He is in no acute distress.  MRI of the brain done yesterday showed chronic-appearing micro hemorrhages in the right brain stem, new since 2008 and chronic right thalamic lacunar new since 2008 along with an unchanged chronic left thalamic lacunar infarct and underlying chronic small vessel disease, scattered chronic micro hemorrhages elsewhere.  ASSESSMENT: 1. Transient ischemic attack. 2. Multiple infarcts probably related to hypertension. 3. Hypertension.  PLANS:  We will get carotid Dopplers today and have Neurology see him. He is currently off aspirin and on Plavix, and will see whether that is a recommendation from Neurology.     Mila Homer. Sudie Bailey, M.D.     SDK/MEDQ  D:  03/04/2012  T:  03/05/2012  Job:  [id number]

## 2012-03-05 NOTE — Progress Notes (Signed)
IV removed, site WNL.  Pt given d/c instructions and new prescriptions.  Discussed home care with patient and discussed home medications, patient verbalizes understanding, teachback completed. F/U appointment in place with Dr Gerilyn Pilgrim and an EEG has been arranged, pt has been given all pre EEG instructions per [place]'s office, pt states they will keep appointment. Pt is stable at this time. Pt taken to main entrance in wheelchair by staff member.

## 2012-03-06 LAB — HOMOCYSTEINE: Homocysteine: 19.4 umol/L — ABNORMAL HIGH (ref 4.0–15.4)

## 2012-03-06 NOTE — Progress Notes (Signed)
[name], [name]             ACCOUNT NO.:  [id number]  MEDICAL RECORD NO.:  [id number]  LOCATION:  A324                          FACILITY:  [facility]  PHYSICIAN:  Mila Homer. Sudie Bailey, M.D.DATE OF BIRTH:  Dec 06, 1920  DATE OF PROCEDURE: DATE OF DISCHARGE:  03/05/2012                                PROGRESS NOTE   SUBJECTIVE:  He is generally feeling well and back to normal.  OBJECTIVE:  GENERAL:  He is sitting up in bed.  His wife is in the room. He is in no acute distress.  He is well-developed, well-nourished.  His speech appears to be normal. HEART:  His heart has a regular rhythm, rate of 80. LUNGS:  Show decreased breath sounds throughout, but he is moving air well. VITAL SIGNS:  Temperature today is 98.3, pulse 65, respiratory rate 18, blood pressure 119/71.  He had his carotid Doppler yesterday, which showed "mild bilateral carotid bifurcation proximal ICA plaque, left greater than right, resulting in less than 50% diameter stenosis."  ASSESSMENT: 1. Transient ischemic attack. 2. Multiple infarcts, probably related to hypertension. 3. Mild carotid artery stenosis. 4. Benign essential hypertension.  PLAN:  I talked to Dr. Gerilyn Pilgrim, neurologist, yesterday on the phone. He is due to see him before the patient's discharge.     Mila Homer. Sudie Bailey, M.D.     SDK/MEDQ  D:  03/05/2012  T:  03/06/2012  Job:  [id number]

## 2012-03-07 NOTE — Discharge Summary (Signed)
[name], [name]             ACCOUNT NO.:  [id number]  MEDICAL RECORD NO.:  [id number]  LOCATION:  A324                          FACILITY:  [facility]  PHYSICIAN:  Mila Homer. Sudie Bailey, M.D.DATE OF BIRTH:  03/27/1921  DATE OF ADMISSION:  03/02/2012 DATE OF DISCHARGE:  10/02/2013LH                              DISCHARGE SUMMARY   SUBJECTIVE:  This 78 year old male was admitted to the hospital with what appeared to be a TIA.  He had a benign 4 day hospitalization extending from September 29 to March 05, 2012.  Vital signs were stable.  LABORATORY DATA:  On admission, he had a BUN of 31, creatinine 1.99, potassium 3.3, glucose 140.  His AST was slightly elevated at 167 and ALT 60.  His lipid profile was essentially normal.  Homocystine 19.4 which is elevated.  Hemoglobin is 11.5 with a normal white cell count. TSH was 3.339.  UA was negative.  DIAGNOSTICS: 1. CT scan of his head showed no acute findings, but atrophy and     chronic microvascular ischemic change. 2. MRI of the brain without contrast showed no acute intracranial     abnormality, but it did show chronic-appearing microhemorrhage in     the right brain stem which seemed to be new compared to 2008 and     scattered chronic microhemorrhages elsewhere.  There was other     underlying chronic small vessel disease unchanged and chronic left     thalamic lacunar infarct.  A chronic right thalamic lacunar infarct     is new since 2008. 3. Carotid Dopplers showed bilateral carotid bifurcation and proximal     ICA plaque, left greater than right resulting in less than 50%     diameter stenosis. 4. His 12-lead EKG shows a sinus rhythm and first-degree AV block. 5. His chest x-ray showed no acute disease.  HOSPITAL COURSE:  He is admitted to the hospital.  He does not really remember the entire first day, but the next day he woke up.  He remembered they had been in church the day before this happened.  He is greatly improved by  his second day.  Further diagnostic workup was done his second and third day, and he was also seen by the neurologist.  Dr. Gerilyn Pilgrim, neurologist, saw him in consult.  He was not convinced that he had a TIA, but thought he might as well have had a small seizure.  Arrangements were made for him to have an outpatient EEG in the office of Dr. Gerilyn Pilgrim when he has followup with him.  DISCHARGE MEDICATIONS: 1. Amlodipine 10 mg daily. 2. Gabapentin 100 mg at bedtime. 3. Levothyroxine 112 mcg daily. 4. Loratadine 10 mg daily for allergy. 5. Lorazepam 2 mg b.i.d. 6. Memantine 10 mg b.i.d. 7. Plavix 75 mg daily.  FOLLOW UP: 1. Follow up with me within the next 7 to 10 days. 2. Dr. Gerilyn Pilgrim in the near future.  FINAL DISCHARGE DIAGNOSES: 1. Presumptive transient ischemic attack. 2. Benign essential hypertension. 3. History of lacunar infarcts. 4. Hypothyroidism. 5. Question seizure disorder. 6. History of prostate cancer. 7. Peripheral vascular disease. 8. Allergic rhinitis.     Mila Homer. Sudie Bailey, M.D.  SDK/MEDQ  D:  03/06/2012  T:  03/07/2012  Job:  [id number]

## 2012-06-04 DIAGNOSIS — I639 Cerebral infarction, unspecified: Secondary | ICD-10-CM

## 2012-06-04 DIAGNOSIS — C61 Malignant neoplasm of prostate: Secondary | ICD-10-CM

## 2012-06-04 HISTORY — DX: Cerebral infarction, unspecified: I63.9

## 2012-06-04 HISTORY — DX: Malignant neoplasm of prostate: C61

## 2012-09-17 ENCOUNTER — Encounter (HOSPITAL_COMMUNITY): Payer: Self-pay

## 2012-09-17 ENCOUNTER — Emergency Department (HOSPITAL_COMMUNITY)
Admission: EM | Admit: 2012-09-17 | Discharge: 2012-09-17 | Disposition: A | Discharge status: Home / Self Care | Payer: Medicare Other | Attending: Emergency Medicine | Admitting: Emergency Medicine

## 2012-09-17 DIAGNOSIS — S39012A Strain of muscle, fascia and tendon of lower back, initial encounter: Secondary | ICD-10-CM

## 2012-09-17 DIAGNOSIS — I10 Essential (primary) hypertension: Secondary | ICD-10-CM | POA: Insufficient documentation

## 2012-09-17 DIAGNOSIS — Y9389 Activity, other specified: Secondary | ICD-10-CM | POA: Insufficient documentation

## 2012-09-17 DIAGNOSIS — Z9889 Other specified postprocedural states: Secondary | ICD-10-CM | POA: Insufficient documentation

## 2012-09-17 DIAGNOSIS — Z79899 Other long term (current) drug therapy: Secondary | ICD-10-CM | POA: Insufficient documentation

## 2012-09-17 DIAGNOSIS — Y92009 Unspecified place in unspecified non-institutional (private) residence as the place of occurrence of the external cause: Secondary | ICD-10-CM | POA: Insufficient documentation

## 2012-09-17 DIAGNOSIS — Z859 Personal history of malignant neoplasm, unspecified: Secondary | ICD-10-CM | POA: Insufficient documentation

## 2012-09-17 DIAGNOSIS — Z8673 Personal history of transient ischemic attack (TIA), and cerebral infarction without residual deficits: Secondary | ICD-10-CM | POA: Insufficient documentation

## 2012-09-17 DIAGNOSIS — Z7902 Long term (current) use of antithrombotics/antiplatelets: Secondary | ICD-10-CM | POA: Insufficient documentation

## 2012-09-17 DIAGNOSIS — S335XXA Sprain of ligaments of lumbar spine, initial encounter: Secondary | ICD-10-CM | POA: Insufficient documentation

## 2012-09-17 DIAGNOSIS — E079 Disorder of thyroid, unspecified: Secondary | ICD-10-CM | POA: Insufficient documentation

## 2012-09-17 DIAGNOSIS — X503XXA Overexertion from repetitive movements, initial encounter: Secondary | ICD-10-CM | POA: Insufficient documentation

## 2012-09-17 HISTORY — DX: Cerebral infarction, unspecified: I63.9

## 2012-09-17 MED ORDER — NAPROXEN 500 MG PO TABS: ORAL_TABLET | ORAL | Status: DC

## 2012-09-17 NOTE — ED Notes (Signed)
Pt reports worked out in the yard Monday and c/o lower back pain since then.

## 2012-09-17 NOTE — ED Notes (Signed)
Pt states after doing yard work and washing cars on Monday pt says his back began to hurt him.

## 2012-09-17 NOTE — ED Provider Notes (Signed)
History    This chart was scribed for [name], MD by Marlyne Beards, ED Scribe. The patient was seen in room APA19/APA19. Patient's care was started at 3:33 PM.    CSN: [id number]  Arrival date & time 09/17/12  1519   First MD Initiated Contact with Patient 09/17/12 1533      Chief Complaint  Patient presents with  . Back Pain    (Consider location/radiation/quality/duration/timing/severity/associated sxs/prior treatment) Patient is a 79 y.o. male presenting with back pain. The history is provided by the patient and a relative. No language interpreter was used.  Back Pain Location:  Lumbar spine Pain severity:  Moderate Pain is:  Same all the time Duration:  2 days Timing:  Constant Progression:  Unchanged Worsened by:  Movement Associated symptoms: no abdominal pain, no chest pain and no headaches   Risk factors: hx of cancer    [name] is a 79 y.o. male who presents to the Emergency Department complaining of moderate constant lower back pain onset Monday (April 14). Pt states he was working out in the yard Monday and reports he over did it. Pt states that Aleve really helps with his pain but was taken off and told to take Tylenol due to complications with current medication he is taking. Pt states he was going to come in yesterday but his wife had surgery. Relative states pt has been suffering from intermittent mini strokes recently which have made him act strange. Relative states that last year he was stung by bees which triggered the onset of his intermittent mini strokes. Pt denies bladder/bowel incontinence, fever, chills, cough, nausea, vomiting, diarrhea, SOB, weakness, and any other associated symptoms. Pt is currently taking BP medication (Norvasc) and blood thinner medication (Plavix). Pt's current PCP is Dr. Sudie Bailey.    Past Medical History  Diagnosis Date  . HTN (hypertension)   . Thyroid disease   . Cancer   . Stroke     TIA    Past Surgical  History  Procedure Laterality Date  . Hernia repair      x 2  . Prostate surgery    . Back surgery      cervical spine    Family History  Problem Relation Age of Onset  . Arthritis    . Cancer    . Asthma    . Diabetes      History  Substance Use Topics  . Smoking status: Never Smoker   . Smokeless tobacco: Not on file  . Alcohol Use: No      Review of Systems  Constitutional: Negative for appetite change and fatigue.  HENT: Negative for congestion, sinus pressure and ear discharge.   Eyes: Negative for discharge.  Respiratory: Negative for cough.   Cardiovascular: Negative for chest pain.  Gastrointestinal: Negative for abdominal pain and diarrhea.  Genitourinary: Negative for frequency and hematuria.  Musculoskeletal: Positive for back pain.  Skin: Negative for rash.  Neurological: Negative for seizures and headaches.  Psychiatric/Behavioral: Negative for hallucinations.    Allergies  Aspirin  Home Medications   Current Outpatient Rx  Name  Route  Sig  Dispense  Refill  . amLODipine (NORVASC) 10 MG tablet   Oral   Take 10 mg by mouth daily.         . clopidogrel (PLAVIX) 75 MG tablet   Oral   Take 1 tablet (75 mg total) by mouth daily with breakfast.   30 tablet   11   .  gabapentin (NEURONTIN) 100 MG capsule   Oral   Take 1 capsule (100 mg total) by mouth at bedtime.   90 capsule   2   . levothyroxine (SYNTHROID, LEVOTHROID) 112 MCG tablet   Oral   Take 112 mcg by mouth daily.         Marland Kitchen loratadine (CLARITIN) 10 MG tablet   Oral   Take 10 mg by mouth daily.         Marland Kitchen LORazepam (ATIVAN) 2 MG tablet   Oral   Take 2 mg by mouth 2 (two) times daily.          . memantine (NAMENDA) 10 MG tablet   Oral   Take 10 mg by mouth 2 (two) times daily.           BP 141/69  Pulse 91  Temp(Src) 97.1 F (36.2 C) (Oral)  Resp 20  Ht 5\' 10"  (1.778 m)  Wt 195 lb (88.451 kg)  BMI 27.98 kg/m2  SpO2 98%  Physical Exam  Nursing note and  vitals reviewed. Constitutional: He is oriented to person, place, and time. He appears well-developed.  HENT:  Head: Normocephalic.  Eyes: Conjunctivae and EOM are normal. No scleral icterus.  Neck: Neck supple. No thyromegaly present.  Cardiovascular: Normal rate and regular rhythm.  Exam reveals no gallop and no friction rub.   No murmur heard. Pulmonary/Chest: No stridor. He has no wheezes. He has no rales. He exhibits no tenderness.  Abdominal: He exhibits no distension. There is no tenderness. There is no rebound.  Musculoskeletal: Normal range of motion. He exhibits tenderness. He exhibits no edema.  Lower left lumbar pain upon palpation.  Lymphadenopathy:    He has no cervical adenopathy.  Neurological: He is oriented to person, place, and time. Coordination normal.  Skin: No rash noted. No erythema.  Psychiatric: He has a normal mood and affect. His behavior is normal.    ED Course  Procedures (including critical care time) DIAGNOSTIC STUDIES: Oxygen Saturation is 98% on room air, normal by my interpretation.    COORDINATION OF CARE: 3:34 PM Discussed ED treatment with pt and pt agrees.     Labs Reviewed - No data to display No results found.   No diagnosis found.    MDM   The chart was scribed for me under my direct supervision.  I personally performed the history, physical, and medical decision making and all procedures in the evaluation of this patient.[name], MD 09/17/12 [phone]

## 2012-09-29 ENCOUNTER — Inpatient Hospital Stay (HOSPITAL_COMMUNITY)
Admission: EM | Admit: 2012-09-29 | Discharge: 2012-10-01 | DRG: 690 | Disposition: A | Discharge status: Home / Self Care | Payer: Medicare Other | Attending: Family Medicine | Admitting: Family Medicine

## 2012-09-29 ENCOUNTER — Encounter (HOSPITAL_COMMUNITY): Payer: Self-pay | Admitting: *Deleted

## 2012-09-29 ENCOUNTER — Emergency Department (HOSPITAL_COMMUNITY): Payer: Medicare Other

## 2012-09-29 DIAGNOSIS — E876 Hypokalemia: Secondary | ICD-10-CM | POA: Diagnosis present

## 2012-09-29 DIAGNOSIS — F0390 Unspecified dementia without behavioral disturbance: Secondary | ICD-10-CM

## 2012-09-29 DIAGNOSIS — Z809 Family history of malignant neoplasm, unspecified: Secondary | ICD-10-CM

## 2012-09-29 DIAGNOSIS — I129 Hypertensive chronic kidney disease with stage 1 through stage 4 chronic kidney disease, or unspecified chronic kidney disease: Secondary | ICD-10-CM | POA: Diagnosis present

## 2012-09-29 DIAGNOSIS — E860 Dehydration: Secondary | ICD-10-CM | POA: Diagnosis present

## 2012-09-29 DIAGNOSIS — E039 Hypothyroidism, unspecified: Secondary | ICD-10-CM | POA: Diagnosis present

## 2012-09-29 DIAGNOSIS — Z825 Family history of asthma and other chronic lower respiratory diseases: Secondary | ICD-10-CM

## 2012-09-29 DIAGNOSIS — Z7902 Long term (current) use of antithrombotics/antiplatelets: Secondary | ICD-10-CM

## 2012-09-29 DIAGNOSIS — Z8261 Family history of arthritis: Secondary | ICD-10-CM

## 2012-09-29 DIAGNOSIS — R4182 Altered mental status, unspecified: Secondary | ICD-10-CM

## 2012-09-29 DIAGNOSIS — Z79899 Other long term (current) drug therapy: Secondary | ICD-10-CM

## 2012-09-29 DIAGNOSIS — Z8546 Personal history of malignant neoplasm of prostate: Secondary | ICD-10-CM

## 2012-09-29 DIAGNOSIS — I739 Peripheral vascular disease, unspecified: Secondary | ICD-10-CM | POA: Diagnosis present

## 2012-09-29 DIAGNOSIS — Z8673 Personal history of transient ischemic attack (TIA), and cerebral infarction without residual deficits: Secondary | ICD-10-CM

## 2012-09-29 DIAGNOSIS — Z833 Family history of diabetes mellitus: Secondary | ICD-10-CM

## 2012-09-29 DIAGNOSIS — N390 Urinary tract infection, site not specified: Secondary | ICD-10-CM

## 2012-09-29 DIAGNOSIS — N189 Chronic kidney disease, unspecified: Secondary | ICD-10-CM | POA: Diagnosis present

## 2012-09-29 LAB — CBC WITH DIFFERENTIAL/PLATELET
Basophils Absolute: 0 10*3/uL (ref 0.0–0.1)
Eosinophils Relative: 1 % (ref 0–5)
HCT: 31.8 % — ABNORMAL LOW (ref 39.0–52.0)
Lymphocytes Relative: 24 % (ref 12–46)
MCV: 87.6 fL (ref 78.0–100.0)
Monocytes Absolute: 0.8 10*3/uL (ref 0.1–1.0)
Monocytes Relative: 7 % (ref 3–12)
RDW: 15.2 % (ref 11.5–15.5)
WBC: 11.6 10*3/uL — ABNORMAL HIGH (ref 4.0–10.5)

## 2012-09-29 LAB — BASIC METABOLIC PANEL
BUN: 26 mg/dL — ABNORMAL HIGH (ref 6–23)
CO2: 31 mEq/L (ref 19–32)
Calcium: 9.5 mg/dL (ref 8.4–10.5)
Creatinine, Ser: 1.96 mg/dL — ABNORMAL HIGH (ref 0.50–1.35)
Glucose, Bld: 108 mg/dL — ABNORMAL HIGH (ref 70–99)

## 2012-09-29 LAB — URINALYSIS, ROUTINE W REFLEX MICROSCOPIC
Glucose, UA: NEGATIVE mg/dL
Ketones, ur: NEGATIVE mg/dL
Protein, ur: 30 mg/dL — AB

## 2012-09-29 LAB — URINE MICROSCOPIC-ADD ON

## 2012-09-29 MED ORDER — POTASSIUM CHLORIDE CRYS ER 20 MEQ PO TBCR
40.0000 meq | EXTENDED_RELEASE_TABLET | Freq: Once | ORAL | Status: AC
  Administered 2012-09-29: 40 meq via ORAL
  Filled 2012-09-29: qty 2

## 2012-09-29 MED ORDER — ENOXAPARIN SODIUM 40 MG/0.4ML ~~LOC~~ SOLN
40.0000 mg | SUBCUTANEOUS | Status: DC
  Administered 2012-09-29 – 2012-09-30 (×2): 40 mg via SUBCUTANEOUS
  Filled 2012-09-29 (×2): qty 0.4

## 2012-09-29 MED ORDER — LORAZEPAM 2 MG/ML IJ SOLN
0.5000 mg | INTRAMUSCULAR | Status: DC | PRN
  Administered 2012-09-29: 0.5 mg via INTRAVENOUS
  Filled 2012-09-29: qty 1

## 2012-09-29 MED ORDER — DEXTROSE 5 % IV SOLN
1.0000 g | Freq: Once | INTRAVENOUS | Status: AC
  Administered 2012-09-29: 1 g via INTRAVENOUS
  Filled 2012-09-29: qty 10

## 2012-09-29 MED ORDER — POTASSIUM CHLORIDE 10 MEQ/100ML IV SOLN
10.0000 meq | Freq: Once | INTRAVENOUS | Status: AC
  Administered 2012-09-29: 10 meq via INTRAVENOUS
  Filled 2012-09-29: qty 100

## 2012-09-29 MED ORDER — LORAZEPAM 2 MG/ML IJ SOLN
0.5000 mg | Freq: Once | INTRAMUSCULAR | Status: AC
  Administered 2012-09-29: 0.5 mg via INTRAVENOUS
  Filled 2012-09-29: qty 1

## 2012-09-29 MED ORDER — CLOPIDOGREL BISULFATE 75 MG PO TABS
75.0000 mg | ORAL_TABLET | Freq: Every day | ORAL | Status: DC
  Administered 2012-09-30 – 2012-10-01 (×2): 75 mg via ORAL
  Filled 2012-09-29 (×2): qty 1

## 2012-09-29 MED ORDER — MEMANTINE HCL 10 MG PO TABS
10.0000 mg | ORAL_TABLET | Freq: Two times a day (BID) | ORAL | Status: DC
  Administered 2012-09-29 – 2012-10-01 (×4): 10 mg via ORAL
  Filled 2012-09-29 (×4): qty 1

## 2012-09-29 MED ORDER — GABAPENTIN 100 MG PO CAPS
100.0000 mg | ORAL_CAPSULE | Freq: Every day | ORAL | Status: DC
  Administered 2012-09-29 – 2012-09-30 (×2): 100 mg via ORAL
  Filled 2012-09-29 (×2): qty 1

## 2012-09-29 MED ORDER — LEVOTHYROXINE SODIUM 112 MCG PO TABS
112.0000 ug | ORAL_TABLET | Freq: Every day | ORAL | Status: DC
  Administered 2012-09-30 – 2012-10-01 (×2): 112 ug via ORAL
  Filled 2012-09-29 (×4): qty 1

## 2012-09-29 MED ORDER — SODIUM CHLORIDE 0.9 % IV BOLUS (SEPSIS)
1000.0000 mL | Freq: Once | INTRAVENOUS | Status: AC
  Administered 2012-09-29: 1000 mL via INTRAVENOUS

## 2012-09-29 MED ORDER — POTASSIUM CHLORIDE 10 MEQ/100ML IV SOLN
10.0000 meq | INTRAVENOUS | Status: AC
  Administered 2012-09-29 (×4): 10 meq via INTRAVENOUS
  Filled 2012-09-29: qty 400

## 2012-09-29 MED ORDER — DEXTROSE 5 % IV SOLN
1.0000 g | INTRAVENOUS | Status: DC
  Administered 2012-09-30: 1 g via INTRAVENOUS
  Filled 2012-09-29 (×2): qty 10

## 2012-09-29 MED ORDER — ASPIRIN EC 81 MG PO TBEC
81.0000 mg | DELAYED_RELEASE_TABLET | Freq: Every day | ORAL | Status: DC
  Administered 2012-09-29 – 2012-10-01 (×3): 81 mg via ORAL
  Filled 2012-09-29 (×3): qty 1

## 2012-09-29 MED ORDER — AMLODIPINE BESYLATE 5 MG PO TABS
10.0000 mg | ORAL_TABLET | Freq: Every day | ORAL | Status: DC
  Administered 2012-09-29 – 2012-10-01 (×3): 10 mg via ORAL
  Filled 2012-09-29 (×3): qty 2

## 2012-09-29 NOTE — ED Notes (Signed)
Patient given water at this time per RN approval. 

## 2012-09-29 NOTE — ED Provider Notes (Signed)
History     CSN: [id number]  Arrival date & time 09/29/12  1314   First MD Initiated Contact with Patient 09/29/12 1527      Chief Complaint  Patient presents with  . Altered Mental Status    (Consider location/radiation/quality/duration/timing/severity/associated sxs/prior treatment) HPI Sent to ED by Neuro NP who saw Pt for first time today. Patient has dementia and several months as of gradually worsening memory, patient is a baseline mental status today but apparently hasn't slept in the last several days and has been more confused at times over the last several days from baseline, patient is post using CPAP and has a CPAP machine at home but has not used it in several months, patient's family was advised to use the patient CPAP machine at night from now on and was also told to come to the emergency department today should the patient did have a urine infection. There's been no fever no vomiting no abdominal pain no diarrhea no bloody stools no change in his recent mental status no rash no trauma no new focal neurologic symptoms he has had slight cough for a few days if at all. He is sent to the emergency Department to make sure he does not have a urinary tract infection. Past Medical History  Diagnosis Date  . HTN (hypertension)   . Thyroid disease   . Stroke     TIA  . Cancer     Past Surgical History  Procedure Laterality Date  . Hernia repair      x 2  . Prostate surgery    . Back surgery      cervical spine    Family History  Problem Relation Age of Onset  . Arthritis    . Cancer    . Asthma    . Diabetes      History  Substance Use Topics  . Smoking status: Never Smoker   . Smokeless tobacco: Not on file  . Alcohol Use: No      Review of Systems  Unable to perform ROS: Dementia    Allergies  Aspirin  Home Medications   No current outpatient prescriptions on file.  BP 175/82  Pulse 79  Temp(Src) 98 F (36.7 C) (Oral)  Resp 20  Ht 5\' 8"   (1.727 m)  Wt 191 lb 6.4 oz (86.818 kg)  BMI 29.11 kg/m2  SpO2 98%  Physical Exam  Nursing note and vitals reviewed. Constitutional:  Awake, alert, nontoxic appearance.  HENT:  Head: Atraumatic.  Eyes: Right eye exhibits no discharge. Left eye exhibits no discharge.  Neck: Neck supple.  Cardiovascular: Normal rate and regular rhythm.   No murmur heard. Pulmonary/Chest: Effort normal and breath sounds normal. No respiratory distress. He has no wheezes. He has no rales. He exhibits no tenderness.  Abdominal: Soft. Bowel sounds are normal. He exhibits no distension. There is no tenderness. There is no rebound.  Musculoskeletal: He exhibits no tenderness.  Baseline ROM, no obvious new focal weakness.  Neurological: He is alert.  Mental status and motor strength appears baseline for patient and situation. Pleasantly confused, follow simple commands.  Skin: No rash noted.  Psychiatric: He has a normal mood and affect.    ED Course  Procedures (including critical care time) Patient gets anxious and agitated at home on occasion receives Ativan, so Ativan ordered in the ED per family request, due to the combination of worse confusion than usual at times in the last several days insomnia with urinary tract  infection and hypokalemia patient will receive potassium repletion, antibiotics, and IV fluids and will check lactate level in the ED and call PCP for observation. 1730  ECG: Sinus rhythm, ventricular rate 81, sinus arrhythmia, first degree AV block, PR interval 214 ms, right bundle branch block, no definite acute ischemic changes noted, no significant change noted compared with September 2013  Pt stable in ED with no significant deterioration in condition.Patient / Family / Caregiver informed of clinical course, understand medical decision-making process, and agree with plan.d/w Felecia Shelling for Obs on-call for [facility]. Labs Reviewed  URINALYSIS, ROUTINE W REFLEX MICROSCOPIC - Abnormal; Notable  for the following:    Hgb urine dipstick SMALL (*)    Protein, ur 30 (*)    Leukocytes, UA MODERATE (*)    All other components within normal limits  CBC WITH DIFFERENTIAL - Abnormal; Notable for the following:    WBC 11.6 (*)    RBC 3.63 (*)    Hemoglobin 10.6 (*)    HCT 31.8 (*)    Neutro Abs 7.9 (*)    All other components within normal limits  BASIC METABOLIC PANEL - Abnormal; Notable for the following:    Potassium 2.4 (*)    Chloride 90 (*)    Glucose, Bld 108 (*)    BUN 26 (*)    Creatinine, Ser 1.96 (*)    GFR calc non Af Amer 29 (*)    GFR calc Af Amer 33 (*)    All other components within normal limits  URINE MICROSCOPIC-ADD ON - Abnormal; Notable for the following:    Bacteria, UA MANY (*)    All other components within normal limits  LACTIC ACID, PLASMA - Abnormal; Notable for the following:    Lactic Acid, Venous 2.4 (*)    All other components within normal limits  BASIC METABOLIC PANEL - Abnormal; Notable for the following:    Potassium 3.2 (*)    Glucose, Bld 105 (*)    BUN 24 (*)    Creatinine, Ser 1.80 (*)    GFR calc non Af Amer 32 (*)    GFR calc Af Amer 37 (*)    All other components within normal limits  CBC - Abnormal; Notable for the following:    RBC 3.85 (*)    Hemoglobin 11.3 (*)    HCT 34.2 (*)    All other components within normal limits  BASIC METABOLIC PANEL - Abnormal; Notable for the following:    Sodium 134 (*)    Potassium 3.4 (*)    Glucose, Bld 101 (*)    Creatinine, Ser 1.55 (*)    GFR calc non Af Amer 38 (*)    GFR calc Af Amer 44 (*)    All other components within normal limits  URINE CULTURE      1. Hypokalemia   2. Urinary tract infection   3. Altered mental status   4. Dementia       MDM  The patient appears reasonably stabilized for admission considering the current resources, flow, and capabilities available in the ED at this time, and I doubt any other [facility] requiring further screening and/or treatment in the ED  prior to admission.        Hurman Horn, MD 10/02/12 [phone]

## 2012-09-29 NOTE — Progress Notes (Signed)
ANTICOAGULATION CONSULT NOTE - Initial Consult  Pharmacy Consult for Lovenox Indication: VTE prophylaxis  Allergies  Allergen Reactions  . Aspirin     REACTION: "elevated pulse". Cannot take any strength higher than 81mg     Patient Measurements: Weight: 191 lb 6.4 oz (86.818 kg)  Vital Signs: Temp: 98.2 F (36.8 C) (04/28 2007) Temp src: Oral (04/28 2007) BP: 150/69 mmHg (04/28 2007) Pulse Rate: 97 (04/28 2007)  Labs:  Recent Labs  09/29/12 1539  HGB 10.6*  HCT 31.8*  PLT 221  CREATININE 1.96*    The CrCl is unknown because both a height and weight (above a minimum accepted value) are required for this calculation.   Medical History: Past Medical History  Diagnosis Date  . HTN (hypertension)   . Thyroid disease   . Stroke     TIA  . Cancer     Medications:  Scheduled:  . amLODipine  10 mg Oral Daily  . aspirin EC  81 mg Oral Daily  . [COMPLETED] cefTRIAXone (ROCEPHIN) IVPB 1 gram/50 mL D5W  1 g Intravenous Once  . [START ON 09/30/2012] cefTRIAXone (ROCEPHIN) IVPB 1 gram/50 mL D5W  1 g Intravenous Q24H  . [START ON 09/30/2012] clopidogrel  75 mg Oral Q breakfast  . enoxaparin (LOVENOX) injection  40 mg Subcutaneous Q24H  . gabapentin  100 mg Oral QHS  . [START ON 09/30/2012] levothyroxine  112 mcg Oral QAC breakfast  . [COMPLETED] LORazepam  0.5 mg Intravenous Once  . memantine  10 mg Oral BID  . [COMPLETED] potassium chloride  10 mEq Intravenous Once  . potassium chloride  10 mEq Intravenous Q1 Hr x 4  . [COMPLETED] potassium chloride SA  40 mEq Oral Once  . [COMPLETED] sodium chloride  1,000 mL Intravenous Once    Assessment: 79 yo M admitted for UTI to start on Lovenox for VTE px while admitted.  No hx of bleeding noted.  His estimated CrCl is 30-35 ml/min which appears to be this patient's baseline.    Goal of Therapy:  Monitor platelets by anticoagulation protocol: Yes   Plan:  Lovenox 40mg  sq daily Monitor renal function & adjust as  needed Monitor for s/sx of bleeding  Harmon Bommarito, [facility] 09/29/2012,10:20 PM

## 2012-09-29 NOTE — ED Notes (Signed)
Sent here from  Dr Ronal Fear office for eval of possible UTI and "not enough 02 going to his brain"     Daughter says pt was talking at doctor's office, but now only humming and at times saying a few words

## 2012-09-30 LAB — BASIC METABOLIC PANEL
BUN: 24 mg/dL — ABNORMAL HIGH (ref 6–23)
CO2: 29 mEq/L (ref 19–32)
Calcium: 9.2 mg/dL (ref 8.4–10.5)
Chloride: 96 mEq/L (ref 96–112)
Creatinine, Ser: 1.8 mg/dL — ABNORMAL HIGH (ref 0.50–1.35)
GFR calc Af Amer: 37 mL/min — ABNORMAL LOW (ref >90)

## 2012-09-30 MED ORDER — POTASSIUM CHLORIDE CRYS ER 20 MEQ PO TBCR
20.0000 meq | EXTENDED_RELEASE_TABLET | Freq: Two times a day (BID) | ORAL | Status: DC
  Administered 2012-09-30 – 2012-10-01 (×3): 20 meq via ORAL
  Filled 2012-09-30 (×3): qty 1

## 2012-09-30 NOTE — H&P (Signed)
[name] MRN: [id number] DOB/AGE: 1920/12/05 79 y.o. Primary Care Physician:KNOWLTON,STEPHEN D, MD Admit date: 09/29/2012 Chief Complaint:  Change in mental status and confusion HPI:  This is an 79 years old male with history of multiple medical illnesses was to Er due to the above complaint. Patient was seen in neurology clinic due to worsening dementia but he was found to be confused. He was evaluated in Er and was found to have UTI. He was started on IV antibiotics and was admitted for further treatment. Patient is unable to give further history including review of system.  Past Medical History  Diagnosis Date  . HTN (hypertension)   . Thyroid disease   . Stroke     TIA  . Cancer    Past Surgical History  Procedure Laterality Date  . Hernia repair      x 2  . Prostate surgery    . Back surgery      cervical spine        Family History  Problem Relation Age of Onset  . Arthritis    . Cancer    . Asthma    . Diabetes      Social History:  reports that he has never smoked. He does not have any smokeless tobacco history on file. He reports that he does not drink alcohol or use illicit drugs.   Allergies:  Allergies  Allergen Reactions  . Aspirin     REACTION: "elevated pulse". Cannot take any strength higher than 81mg     Medications Prior to Admission  Medication Sig Dispense Refill  . acetaminophen (TYLENOL) 500 MG tablet Take 1,000 mg by mouth daily as needed for pain.      Marland Kitchen amLODipine (NORVASC) 10 MG tablet Take 10 mg by mouth daily.      Marland Kitchen aspirin EC 81 MG tablet Take 81 mg by mouth daily.      . clopidogrel (PLAVIX) 75 MG tablet Take 1 tablet (75 mg total) by mouth daily with breakfast.  30 tablet  11  . gabapentin (NEURONTIN) 100 MG capsule Take 1 capsule (100 mg total) by mouth at bedtime.  90 capsule  2  . levothyroxine (SYNTHROID, LEVOTHROID) 112 MCG tablet Take 112 mcg by mouth daily.      . memantine (NAMENDA) 10 MG tablet Take 10 mg by mouth 2  (two) times daily.      . [DISCONTINUED] loratadine (CLARITIN) 10 MG tablet Take 10 mg by mouth at bedtime.       . [DISCONTINUED] LORazepam (ATIVAN) 2 MG tablet Take 2 mg by mouth 2 (two) times daily.       . [DISCONTINUED] naproxen (NAPROSYN) 500 MG tablet Take one pill twice a day for back pain  20 tablet  0       NWG:NFAOZ from the symptoms mentioned above,there are no other symptoms referable to all systems reviewed.  Physical Exam: Blood pressure 167/81, pulse 62, temperature 97.3 F (36.3 C), temperature source Oral, resp. rate 20, weight 86.818 kg (191 lb 6.4 oz), SpO2 100.00%. General Condition- alert, awake but confused and disoriented HE ENT- pupils are equal and reactive, neck supple Respiratory - good air entery, clear lung field CVS- S1 and S2 heard, no murmur or gallop ABD- soft and lax, bowel sound++ EXT- no leg edema    Recent Labs  09/29/12 1539  WBC 11.6*  NEUTROABS 7.9*  HGB 10.6*  HCT 31.8*  MCV 87.6  PLT 221    Recent Labs  09/29/12 1539  NA 135  K 2.4*  CL 90*  CO2 31  GLUCOSE 108*  BUN 26*  CREATININE 1.96*  CALCIUM 9.5  lablast2(ast:2,ALT:2,alkphos:2,bilitot:2,prot:2,albumin:2)@    No results found for this or any previous visit (from the past 240 hour(s)).   Dg Chest 2 View  09/29/2012  *RADIOLOGY REPORT*  Clinical Data: Cough and altered mental status.  CHEST - 2 VIEW  Comparison:  03/02/2012  Findings:  The heart size and mediastinal contours are within normal limits.  Both lungs are clear.  The visualized skeletal structures are unremarkable.  IMPRESSION: No active cardiopulmonary disease.   Original Report Authenticated By: Richarda Overlie, M.D.    Impression:  1. UTI 2.Hypokalemia 3. Change in mental condition secondary to multifactorial      Causes 4. Hypertension 5. Hypothyroidism Active Problems:   * No active hospital problems. *     Plan:  Continue IV antibiotics IV K+ supplement Will monitor BMP  Continue regular  treatment.      Mical Brun Pager [phone]  09/30/2012, 6:06 AM

## 2012-09-30 NOTE — Progress Notes (Signed)
UR Chart Review Completed  

## 2012-10-01 ENCOUNTER — Inpatient Hospital Stay (HOSPITAL_COMMUNITY)
Admission: EM | Admit: 2012-10-01 | Discharge: 2012-10-03 | DRG: 101 | Disposition: A | Discharge status: Home Health | Payer: Medicare Other | Attending: Family Medicine | Admitting: Family Medicine

## 2012-10-01 ENCOUNTER — Encounter (HOSPITAL_COMMUNITY): Payer: Self-pay | Admitting: *Deleted

## 2012-10-01 ENCOUNTER — Emergency Department (HOSPITAL_COMMUNITY): Payer: Medicare Other

## 2012-10-01 DIAGNOSIS — Z7902 Long term (current) use of antithrombotics/antiplatelets: Secondary | ICD-10-CM

## 2012-10-01 DIAGNOSIS — N183 Chronic kidney disease, stage 3 unspecified: Secondary | ICD-10-CM | POA: Diagnosis present

## 2012-10-01 DIAGNOSIS — G40909 Epilepsy, unspecified, not intractable, without status epilepticus: Principal | ICD-10-CM | POA: Diagnosis present

## 2012-10-01 DIAGNOSIS — R569 Unspecified convulsions: Secondary | ICD-10-CM | POA: Diagnosis present

## 2012-10-01 DIAGNOSIS — Z8546 Personal history of malignant neoplasm of prostate: Secondary | ICD-10-CM

## 2012-10-01 DIAGNOSIS — E039 Hypothyroidism, unspecified: Secondary | ICD-10-CM | POA: Diagnosis present

## 2012-10-01 DIAGNOSIS — F0390 Unspecified dementia without behavioral disturbance: Secondary | ICD-10-CM | POA: Diagnosis present

## 2012-10-01 DIAGNOSIS — Z79899 Other long term (current) drug therapy: Secondary | ICD-10-CM

## 2012-10-01 DIAGNOSIS — I10 Essential (primary) hypertension: Secondary | ICD-10-CM

## 2012-10-01 DIAGNOSIS — Z923 Personal history of irradiation: Secondary | ICD-10-CM

## 2012-10-01 DIAGNOSIS — N390 Urinary tract infection, site not specified: Secondary | ICD-10-CM | POA: Diagnosis present

## 2012-10-01 DIAGNOSIS — Z9181 History of falling: Secondary | ICD-10-CM

## 2012-10-01 DIAGNOSIS — E876 Hypokalemia: Secondary | ICD-10-CM | POA: Diagnosis present

## 2012-10-01 DIAGNOSIS — I129 Hypertensive chronic kidney disease with stage 1 through stage 4 chronic kidney disease, or unspecified chronic kidney disease: Secondary | ICD-10-CM | POA: Diagnosis present

## 2012-10-01 DIAGNOSIS — Z8673 Personal history of transient ischemic attack (TIA), and cerebral infarction without residual deficits: Secondary | ICD-10-CM

## 2012-10-01 LAB — PROTIME-INR
INR: 1.05 (ref 0.00–1.49)
Prothrombin Time: 13.6 seconds (ref 11.6–15.2)

## 2012-10-01 LAB — CBC
HCT: 34.2 % — ABNORMAL LOW (ref 39.0–52.0)
Hemoglobin: 10.9 g/dL — ABNORMAL LOW (ref 13.0–17.0)
MCHC: 33 g/dL (ref 30.0–36.0)
MCHC: 33.4 g/dL (ref 30.0–36.0)
MCV: 88.8 fL (ref 78.0–100.0)
Platelets: 202 10*3/uL (ref 150–400)
Platelets: 189 10*3/uL (ref 150–400)
RDW: 15.5 % (ref 11.5–15.5)
WBC: 8.9 10*3/uL (ref 4.0–10.5)

## 2012-10-01 LAB — DIFFERENTIAL
Basophils Absolute: 0.1 10*3/uL (ref 0.0–0.1)
Basophils Relative: 1 % (ref 0–1)
Monocytes Relative: 8 % (ref 3–12)
Neutro Abs: 7.8 10*3/uL — ABNORMAL HIGH (ref 1.7–7.7)
Neutrophils Relative %: 69 % (ref 43–77)

## 2012-10-01 LAB — URINE CULTURE: Colony Count: >=100000

## 2012-10-01 LAB — COMPREHENSIVE METABOLIC PANEL
ALT: 74 U/L — ABNORMAL HIGH (ref 0–53)
AST: 57 U/L — ABNORMAL HIGH (ref 0–37)
Albumin: 3.4 g/dL — ABNORMAL LOW (ref 3.5–5.2)
Alkaline Phosphatase: 68 U/L (ref 39–117)
Chloride: 96 mEq/L (ref 96–112)
Potassium: 3.8 mEq/L (ref 3.5–5.1)
Sodium: 133 mEq/L — ABNORMAL LOW (ref 135–145)
Total Bilirubin: 0.5 mg/dL (ref 0.3–1.2)
Total Protein: 7 g/dL (ref 6.0–8.3)

## 2012-10-01 LAB — POCT I-STAT, CHEM 8
BUN: 18 mg/dL (ref 6–23)
Calcium, Ion: 1.14 mmol/L (ref 1.13–1.30)
Creatinine, Ser: 1.5 mg/dL — ABNORMAL HIGH (ref 0.50–1.35)
Hemoglobin: 12.2 g/dL — ABNORMAL LOW (ref 13.0–17.0)
Sodium: 136 mEq/L (ref 135–145)
TCO2: 21 mmol/L (ref 0–100)

## 2012-10-01 LAB — APTT: aPTT: 28 seconds (ref 24–37)

## 2012-10-01 LAB — BASIC METABOLIC PANEL
BUN: 19 mg/dL (ref 6–23)
Creatinine, Ser: 1.55 mg/dL — ABNORMAL HIGH (ref 0.50–1.35)
GFR calc Af Amer: 44 mL/min — ABNORMAL LOW (ref >90)
GFR calc non Af Amer: 38 mL/min — ABNORMAL LOW (ref >90)

## 2012-10-01 LAB — POCT I-STAT TROPONIN I: Troponin i, poc: 0 ng/mL (ref 0.00–0.08)

## 2012-10-01 MED ORDER — CEFUROXIME AXETIL 250 MG PO TABS: 500.0000 mg | ORAL_TABLET | Freq: Two times a day (BID) | ORAL | Status: DC

## 2012-10-01 MED ORDER — POTASSIUM CHLORIDE ER 10 MEQ PO TBCR: 10.0000 meq | EXTENDED_RELEASE_TABLET | Freq: Two times a day (BID) | ORAL | Status: DC

## 2012-10-01 MED ORDER — LORAZEPAM 2 MG/ML IJ SOLN
1.0000 mg | Freq: Once | INTRAMUSCULAR | Status: AC
  Administered 2012-10-02: 1 mg via INTRAVENOUS
  Filled 2012-10-01: qty 1

## 2012-10-01 MED ORDER — ONDANSETRON HCL 4 MG/2ML IJ SOLN: 4.0000 mg | Freq: Three times a day (TID) | INTRAMUSCULAR | Status: AC | PRN

## 2012-10-01 MED ORDER — SODIUM CHLORIDE 0.9 % IV SOLN
INTRAVENOUS | Status: DC
  Administered 2012-10-02: 03:00:00 via INTRAVENOUS

## 2012-10-01 MED ORDER — LEVETIRACETAM 500 MG/5ML IV SOLN
500.0000 mg | Freq: Two times a day (BID) | INTRAVENOUS | Status: DC
  Administered 2012-10-01 – 2012-10-03 (×3): 500 mg via INTRAVENOUS
  Filled 2012-10-01 (×4): qty 5

## 2012-10-01 NOTE — Progress Notes (Signed)
NAMEESLI, JERNIGAN             ACCOUNT NO.:  [id number]  MEDICAL RECORD NO.:  [id number]  LOCATION:  A217                          FACILITY:  [facility]  PHYSICIAN:  Mila Homer. Sudie Bailey, M.D.DATE OF BIRTH:  1921-02-16  DATE OF PROCEDURE: DATE OF DISCHARGE:  10/01/2012                                PROGRESS NOTE   SUBJECTIVE:  Patient was admitted last night with intense confusion, much worse than usual.  He feels a good deal better today.  OBJECTIVE:  GENERAL:  He is semi-recumbent in bed eating breakfast.  A daughter is with him and she concurs he is much better. VITAL SIGNS:  His temperature is 97.3, pulse 62, respiratory rate 20, blood pressure 167/81. ABDOMEN:  He has no CVA or flank pain.  No abdominal pain.  His abdomen is soft. CHEST:  He is breathing easily. MENTAL STATUS:  His speech is somewhat slowed at times and he appears somewhat confused at times, but recognizes me and really seems very alert.  Please note, his potassium today is 3.2, up from 2.4.  BUN and creatinine 26/1.96 yesterday, now 24/1.80.  His admission white cell count was 11,600 with 68 neutrophils and his hemoglobin was 10.6.  UA showed too numerous to count WBCs and 3-6 RBCs per HPF with many bacteria, and the urine culture is pending.  ASSESSMENT: 1. Increased confusion probably secondary to urinary tract infection. 2. Chronic dementia. 3. Hypokalemia. 4. Chronic renal insufficiency. 5. Benign essential hypertension. 6. History of prostate cancer. 7. Hypothyroidism. 8. Peripheral vascular disease.  PLAN:  I am adding potassium chloride 20 mEq b.i.d.  He will stay on antibiotics and IV fluids today.  Currently, he is on ceftriaxone a gram IV q.24 hours, and he is continued on all his other regular meds including levothyroxine, lorazepam, Namenda, gabapentin, Plavix, aspirin, and lutein.     Mila Homer. Sudie Bailey, M.D.     SDK/MEDQ  D:  09/30/2012  T:  10/01/2012  Job:  [id number]

## 2012-10-01 NOTE — ED Provider Notes (Signed)
History     CSN: [id number]  Arrival date & time 10/01/12  2133   First MD Initiated Contact with Patient 10/01/12 2137      Chief Complaint  Patient presents with  . Code Stroke    (Consider location/radiation/quality/duration/timing/severity/associated sxs/prior treatment) HPI Comments: Patient presents from home with a fall that happened around 7:30 PM. He was discharged from [facility] today after her urinary tract infection. He is found to have slurred speech and stroke was called. No facial droop. Reported to have left-sided weakness initially now resolved. Patient with slurred speech oriented x2. Denies headache, neck pain, chest pain, back pain. Complains of left hip and flank pain. His fall was unwitnessed. Family reports they saw some seizure-like activity that resolved spontaneously. Patient does have a history of seizures and had been on Ativan which was recently discontinued during his hospital stay.  The history is provided by the patient and the EMS personnel. The history is limited by the condition of the patient.    Past Medical History  Diagnosis Date  . HTN (hypertension)   . Thyroid disease   . Stroke     TIA  . Cancer     Past Surgical History  Procedure Laterality Date  . Hernia repair      x 2  . Prostate surgery    . Back surgery      cervical spine    Family History  Problem Relation Age of Onset  . Arthritis    . Cancer    . Asthma    . Diabetes      History  Substance Use Topics  . Smoking status: Never Smoker   . Smokeless tobacco: Not on file  . Alcohol Use: No      Review of Systems  Constitutional: Negative for fever, activity change and appetite change.  HENT: Negative for congestion and rhinorrhea.   Respiratory: Negative for cough, chest tightness and shortness of breath.   Cardiovascular: Negative for chest pain.  Gastrointestinal: Negative for nausea, vomiting and abdominal pain.  Genitourinary: Negative for  dysuria and hematuria.  Musculoskeletal: Negative for back pain.  Skin: Negative for wound.  Neurological: Positive for dizziness, seizures, speech difficulty and numbness. Negative for weakness.  A complete 10 system review of systems was obtained and all systems are negative except as noted in the HPI and PMH.    Allergies  Aspirin  Home Medications   No current outpatient prescriptions on file.  BP 128/64  Pulse 70  Temp(Src) 98.1 F (36.7 C) (Oral)  Resp 16  Ht 5\' 11"  (1.803 m)  Wt 198 lb 10.2 oz (90.1 kg)  BMI 27.72 kg/m2  SpO2 100%  Physical Exam  Constitutional: He appears well-developed and well-nourished. No distress.  HENT:  Head: Normocephalic and atraumatic.  Mouth/Throat: Oropharynx is clear and moist. No oropharyngeal exudate.  Eyes: Conjunctivae and EOM are normal. Pupils are equal, round, and reactive to light.  Neck: Normal range of motion. Neck supple.  Cardiovascular: Normal rate, regular rhythm and normal heart sounds.   No murmur heard. Pulmonary/Chest: Effort normal and breath sounds normal. No respiratory distress.  Abdominal: Soft. There is no tenderness. There is no rebound and no guarding.  Musculoskeletal: Normal range of motion. He exhibits tenderness. He exhibits no edema.  Mild tenderness over left iliac crest. Full range of motion of bilateral hips without pain.  Neurological: He is alert. No cranial nerve deficit. He exhibits normal muscle tone. Coordination normal.  Slurred  speech, smile symmetrical. Cranial nerves intact. Equal grip strength bilaterally, 5 out of 5 strength throughout.  Skin: Skin is warm.    ED Course  Procedures (including critical care time)  Labs Reviewed  CBC - Abnormal; Notable for the following:    WBC 11.3 (*)    RBC 3.79 (*)    Hemoglobin 10.9 (*)    HCT 32.6 (*)    All other components within normal limits  DIFFERENTIAL - Abnormal; Notable for the following:    Neutro Abs 7.8 (*)    All other components  within normal limits  COMPREHENSIVE METABOLIC PANEL - Abnormal; Notable for the following:    Sodium 133 (*)    Glucose, Bld 108 (*)    Creatinine, Ser 1.49 (*)    Albumin 3.4 (*)    AST 57 (*)    ALT 74 (*)    GFR calc non Af Amer 40 (*)    GFR calc Af Amer 46 (*)    All other components within normal limits  URINALYSIS, ROUTINE W REFLEX MICROSCOPIC - Abnormal; Notable for the following:    Leukocytes, UA SMALL (*)    All other components within normal limits  CBC - Abnormal; Notable for the following:    RBC 3.37 (*)    Hemoglobin 9.7 (*)    HCT 28.9 (*)    All other components within normal limits  BASIC METABOLIC PANEL - Abnormal; Notable for the following:    Sodium 134 (*)    Potassium 3.2 (*)    Glucose, Bld 119 (*)    Creatinine, Ser 1.51 (*)    GFR calc non Af Amer 39 (*)    GFR calc Af Amer 45 (*)    All other components within normal limits  GLUCOSE, CAPILLARY - Abnormal; Notable for the following:    Glucose-Capillary 103 (*)    All other components within normal limits  POCT I-STAT, CHEM 8 - Abnormal; Notable for the following:    Creatinine, Ser 1.50 (*)    Glucose, Bld 107 (*)    Hemoglobin 12.2 (*)    HCT 36.0 (*)    All other components within normal limits  URINE CULTURE  ETHANOL  PROTIME-INR  APTT  TROPONIN I  URINE RAPID DRUG SCREEN (HOSP PERFORMED)  URINE MICROSCOPIC-ADD ON  POCT I-STAT TROPONIN I   Ct Head Wo Contrast  10/01/2012  *RADIOLOGY REPORT*  Clinical Data:  Altered mental status.  Fall.  Left-sided weakness all day.  Slurred speech.  History of TIAs.  CT HEAD WITHOUT CONTRAST CT CERVICAL SPINE WITHOUT CONTRAST  Technique:  Multidetector CT imaging of the head and cervical spine was performed following the standard protocol without intravenous contrast.  Multiplanar CT image reconstructions of the cervical spine were also generated.  Comparison:  CT head 03/02/2012.  MRI brain 07/25/2006.  CT HEAD  Findings: Diffuse cerebral atrophy.  Mild  ventricular dilatation suggesting central atrophy.  Low attenuation changes in the deep white matter consistent with small vessel ischemic change.  Old lacunar infarcts in the thalami.  No mass effect or midline shift. No abnormal extra-axial fluid collections.  Gray-white matter junctions are distinct.  Basal cisterns are not effaced.  No evidence of acute intracranial hemorrhage.  No significant change in the appearance intracranial contents since the previous study. No depressed skull fractures.  Visualized paranasal sinuses and mastoid air cells are not opacified.  Vascular calcifications.  IMPRESSION: No acute intracranial abnormalities.  Chronic atrophy and small vessel ischemic  changes.  Old lacunar infarcts.  CT CERVICAL SPINE  Findings: Reversal of the usual cervical lordosis which is likely due to patient positioning but ligamentous injury or muscle spasm can also have this appearance.  No anterior subluxation.  Normal alignment of the facet joints.  Lateral masses of C1 appear symmetrical.  The odontoid process appears intact.  Degenerative changes throughout the cervical spine with narrowed cervical interspaces and endplate hypertrophic changes.  Degenerative disc disease at C4-5.  Degenerative changes in the cervical facet joints and C1-2 level.  No vertebral compression deformities.  No prevertebral soft tissue swelling.  No focal bone lesion or bone destruction.  IMPRESSION: Diffuse degenerative change throughout the cervical spine.  No displaced fractures identified.  Code stroke results were telephoned to Dr. Manus Gunning in the emergency apartment at 2204 hours on 10/01/2012.   Original Report Authenticated By: Burman Nieves, M.D.    Ct Cervical Spine Wo Contrast  10/01/2012  *RADIOLOGY REPORT*  Clinical Data:  Altered mental status.  Fall.  Left-sided weakness all day.  Slurred speech.  History of TIAs.  CT HEAD WITHOUT CONTRAST CT CERVICAL SPINE WITHOUT CONTRAST  Technique:  Multidetector CT  imaging of the head and cervical spine was performed following the standard protocol without intravenous contrast.  Multiplanar CT image reconstructions of the cervical spine were also generated.  Comparison:  CT head 03/02/2012.  MRI brain 07/25/2006.  CT HEAD  Findings: Diffuse cerebral atrophy.  Mild ventricular dilatation suggesting central atrophy.  Low attenuation changes in the deep white matter consistent with small vessel ischemic change.  Old lacunar infarcts in the thalami.  No mass effect or midline shift. No abnormal extra-axial fluid collections.  Gray-white matter junctions are distinct.  Basal cisterns are not effaced.  No evidence of acute intracranial hemorrhage.  No significant change in the appearance intracranial contents since the previous study. No depressed skull fractures.  Visualized paranasal sinuses and mastoid air cells are not opacified.  Vascular calcifications.  IMPRESSION: No acute intracranial abnormalities.  Chronic atrophy and small vessel ischemic changes.  Old lacunar infarcts.  CT CERVICAL SPINE  Findings: Reversal of the usual cervical lordosis which is likely due to patient positioning but ligamentous injury or muscle spasm can also have this appearance.  No anterior subluxation.  Normal alignment of the facet joints.  Lateral masses of C1 appear symmetrical.  The odontoid process appears intact.  Degenerative changes throughout the cervical spine with narrowed cervical interspaces and endplate hypertrophic changes.  Degenerative disc disease at C4-5.  Degenerative changes in the cervical facet joints and C1-2 level.  No vertebral compression deformities.  No prevertebral soft tissue swelling.  No focal bone lesion or bone destruction.  IMPRESSION: Diffuse degenerative change throughout the cervical spine.  No displaced fractures identified.  Code stroke results were telephoned to Dr. Manus Gunning in the emergency apartment at 2204 hours on 10/01/2012.   Original Report  Authenticated By: Burman Nieves, M.D.    Mr Brain Wo Contrast  10/02/2012  *RADIOLOGY REPORT*  Clinical Data: Fall last night.  History of seizures. Hypertension.  Prostate cancer.  MRI HEAD WITHOUT CONTRAST  Technique:  Multiplanar, multiecho pulse sequences of the brain and surrounding structures were obtained according to standard protocol without intravenous contrast.  Comparison: 10/01/2012 CT.  03/03/2012 MR.  Findings: No acute infarct.  Small area of blood breakdown products right pons may be related to result of prior hemorrhagic ischemia, remote trauma or tiny cavernoma.  No other intracranial hemorrhage noted.  Global atrophy  without hydrocephalus.  Remote thalamic infarcts. Moderate small vessel disease type changes.  No intracranial mass lesion (other than possible tiny cavernoma right pons) detected on this unenhanced exam.  Major intracranial vascular structures are patent.  Cervical spondylotic changes with spinal stenosis and mild cord flattening C3-4.  Transverse ligament hypertrophy with mild flattening the ventral aspect of the cervical medullary junction. Cerebellar tonsils minimally low-lying at the upper range of normal limits.  IMPRESSION: No acute infarct.  Small area of blood breakdown products right pons may be related to result of prior hemorrhagic ischemia, remote trauma or tiny cavernoma.  Global atrophy without hydrocephalus.  Remote thalamic infarcts.  Moderate small vessel disease type changes.  Cervical spondylotic changes with spinal stenosis and mild cord flattening C3-4.  Transverse ligament hypertrophy with mild flattening the ventral aspect of the cervical medullary junction.   Original Report Authenticated By: Lacy Duverney, M.D.      1. Seizure   2. Unspecified essential hypertension       MDM  Seen on arrival with neurology team. CT head negative. Slurred speech with recent admission for UTI.  Suspect patient had seizure more likely than stroke. Probable  benzodiazepine withdrawal.  CT head negative. D/w Dr. Amada Jupiter of neurology who agrees with restarting ativan and will also give keppra.  Family states patient is still somnolent and groggy but getting closer to baseline. Will admit for observation and tapering of benzos.   Date: 10/01/2012  Rate: 84  Rhythm: normal sinus rhythm  QRS Axis: normal  Intervals: PR prolonged  ST/T Wave abnormalities: nonspecific ST/T changes  Conduction Disutrbances:right bundle branch block  Narrative Interpretation:   Old EKG Reviewed: unchanged    Glynn Octave, MD 10/02/12 1137

## 2012-10-01 NOTE — ED Notes (Addendum)
Dr. Amada Jupiter at bedside to speak with pt family.  Family states that after pt fell, they saw him having "seizures".  States he was "jerking" and worried because he hit his head.

## 2012-10-01 NOTE — ED Notes (Signed)
Per EMS: pt from home, fell approx 2 hours ago.  After falling, pt has had slurred speech.  Son called 911.  No facial droop.  Initially had weakness on the left side, now strength is equal bilaterally.

## 2012-10-01 NOTE — Progress Notes (Signed)
IV removed, site WNL.  Pt, wife and step daughter given d/c instructions and new prescriptions.  Discussed home care with patient and family, discussed home medications, patient and family verbalizes understanding, teachback completed. F/U appointment in place, pt states they will keep appointment. Pt is stable at this time. Pt taken to main entrance in wheelchair by staff member.

## 2012-10-02 ENCOUNTER — Observation Stay (HOSPITAL_COMMUNITY): Payer: Medicare Other

## 2012-10-02 ENCOUNTER — Inpatient Hospital Stay (HOSPITAL_COMMUNITY): Admit: 2012-10-02 | Discharge: 2012-10-02 | Disposition: A | Discharge status: Home / Self Care | Payer: Medicare Other

## 2012-10-02 DIAGNOSIS — G40909 Epilepsy, unspecified, not intractable, without status epilepticus: Secondary | ICD-10-CM | POA: Diagnosis present

## 2012-10-02 DIAGNOSIS — I10 Essential (primary) hypertension: Secondary | ICD-10-CM

## 2012-10-02 LAB — URINALYSIS, ROUTINE W REFLEX MICROSCOPIC
Ketones, ur: NEGATIVE mg/dL
Protein, ur: NEGATIVE mg/dL
Urobilinogen, UA: 1 mg/dL (ref 0.0–1.0)

## 2012-10-02 LAB — RAPID URINE DRUG SCREEN, HOSP PERFORMED
Amphetamines: NOT DETECTED
Benzodiazepines: NOT DETECTED
Tetrahydrocannabinol: NOT DETECTED

## 2012-10-02 LAB — CBC
HCT: 28.9 % — ABNORMAL LOW (ref 39.0–52.0)
MCHC: 33.6 g/dL (ref 30.0–36.0)
MCV: 85.8 fL (ref 78.0–100.0)
Platelets: 183 10*3/uL (ref 150–400)
RDW: 15.4 % (ref 11.5–15.5)

## 2012-10-02 LAB — BASIC METABOLIC PANEL
BUN: 15 mg/dL (ref 6–23)
Creatinine, Ser: 1.51 mg/dL — ABNORMAL HIGH (ref 0.50–1.35)
GFR calc Af Amer: 45 mL/min — ABNORMAL LOW (ref >90)
GFR calc non Af Amer: 39 mL/min — ABNORMAL LOW (ref >90)

## 2012-10-02 LAB — URINE MICROSCOPIC-ADD ON

## 2012-10-02 MED ORDER — CLOPIDOGREL BISULFATE 75 MG PO TABS
75.0000 mg | ORAL_TABLET | Freq: Every day | ORAL | Status: DC
  Administered 2012-10-02: 75 mg via ORAL
  Filled 2012-10-02: qty 1

## 2012-10-02 MED ORDER — ASPIRIN EC 81 MG PO TBEC
81.0000 mg | DELAYED_RELEASE_TABLET | Freq: Every day | ORAL | Status: DC
  Administered 2012-10-02 – 2012-10-03 (×2): 81 mg via ORAL
  Filled 2012-10-02 (×2): qty 1

## 2012-10-02 MED ORDER — CEFUROXIME AXETIL 500 MG PO TABS
500.0000 mg | ORAL_TABLET | Freq: Two times a day (BID) | ORAL | Status: DC
  Administered 2012-10-02 – 2012-10-03 (×3): 500 mg via ORAL
  Filled 2012-10-02 (×5): qty 1

## 2012-10-02 MED ORDER — LORAZEPAM 1 MG PO TABS
1.0000 mg | ORAL_TABLET | Freq: Every day | ORAL | Status: DC
  Administered 2012-10-02: 1 mg via ORAL
  Filled 2012-10-02: qty 1

## 2012-10-02 MED ORDER — MEMANTINE HCL 10 MG PO TABS
10.0000 mg | ORAL_TABLET | Freq: Two times a day (BID) | ORAL | Status: DC
  Administered 2012-10-02 – 2012-10-03 (×3): 10 mg via ORAL
  Filled 2012-10-02 (×4): qty 1

## 2012-10-02 MED ORDER — POTASSIUM CHLORIDE ER 10 MEQ PO TBCR
10.0000 meq | EXTENDED_RELEASE_TABLET | Freq: Two times a day (BID) | ORAL | Status: DC
  Administered 2012-10-02 – 2012-10-03 (×3): 10 meq via ORAL
  Filled 2012-10-02 (×5): qty 1

## 2012-10-02 MED ORDER — LEVOTHYROXINE SODIUM 112 MCG PO TABS
112.0000 ug | ORAL_TABLET | Freq: Every day | ORAL | Status: DC
  Administered 2012-10-02 – 2012-10-03 (×2): 112 ug via ORAL
  Filled 2012-10-02 (×3): qty 1

## 2012-10-02 MED ORDER — SODIUM CHLORIDE 0.9 % IJ SOLN
3.0000 mL | Freq: Two times a day (BID) | INTRAMUSCULAR | Status: DC
  Administered 2012-10-02 (×2): 3 mL via INTRAVENOUS

## 2012-10-02 MED ORDER — AMLODIPINE BESYLATE 10 MG PO TABS
10.0000 mg | ORAL_TABLET | Freq: Every day | ORAL | Status: DC
  Administered 2012-10-02 – 2012-10-03 (×2): 10 mg via ORAL
  Filled 2012-10-02 (×2): qty 1

## 2012-10-02 MED ORDER — LEVOTHYROXINE SODIUM 112 MCG PO TABS: 112.0000 ug | ORAL_TABLET | Freq: Two times a day (BID) | ORAL | Status: DC

## 2012-10-02 MED ORDER — HEPARIN SODIUM (PORCINE) 5000 UNIT/ML IJ SOLN
5000.0000 [IU] | Freq: Three times a day (TID) | INTRAMUSCULAR | Status: DC
  Filled 2012-10-02: qty 1

## 2012-10-02 MED ORDER — HEPARIN SODIUM (PORCINE) 5000 UNIT/ML IJ SOLN
5000.0000 [IU] | Freq: Three times a day (TID) | INTRAMUSCULAR | Status: DC
  Administered 2012-10-02 – 2012-10-03 (×4): 5000 [IU] via SUBCUTANEOUS
  Filled 2012-10-02 (×7): qty 1

## 2012-10-02 MED ORDER — GABAPENTIN 100 MG PO CAPS
100.0000 mg | ORAL_CAPSULE | Freq: Every day | ORAL | Status: DC
  Administered 2012-10-02: 100 mg via ORAL
  Filled 2012-10-02 (×3): qty 1

## 2012-10-02 NOTE — Progress Notes (Signed)
PT Cancellation Note  Patient Details Name: CEDRIK HEINDL MRN: [id number] DOB: September 27, 1920   Cancelled Treatment:    Reason Eval/Treat Not Completed: Medical issues which prohibited therapy; pt with altered mental status per daughter.  RN requested hold off for today.  Will see tomorrow.   Lane Eland,CYNDI 10/02/2012, 3:04 PM

## 2012-10-02 NOTE — Discharge Summary (Signed)
[name], [name]             ACCOUNT NO.:  [id number]  MEDICAL RECORD NO.:  [id number]  LOCATION:  A217                          FACILITY:  [facility]  PHYSICIAN:  Mila Homer. Sudie Bailey, M.D.DATE OF BIRTH:  03/14/1921  DATE OF ADMISSION:  09/29/2012 DATE OF DISCHARGE:  04/30/2014LH                              DISCHARGE SUMMARY   This 79 year old was admitted to the hospital with worsening dementia. He had a benign 3-day hospitalization extending from April 28 to October 01, 2012.  His admission white cell count was 11,600, of which 68% were neutrophils, and he had a hemoglobin of 10.6.  His potassium was 2.4, chloride 90, BUN 26, creatinine 1.96, lactic acid 2.4.  His UA showed moderate leukocytes and too numerous to count WBCs with 3- 6 RBCs per HPF and many bacteria.  His urine culture grew out greater than 100,000 colonies, but there were multiple bacterial morphotypes.  His chest x-ray showed no active cardiopulmonary disease.  His EKG showed a sinus rhythm, rate of 81, with a right bundle branch block and no change since an EKG of March 02, 2012.  He was admitted to the hospital, put on IV fluids and also ceftriaxone 1 g IV q.24 hours.  Potassium was replaced.  He was continued on his outpatient medicines including Plavix, aspirin, amlodipine, gabapentin, levothyroxine, lorazepam, Namenda.  He was feeling a good deal better his 2nd day and by his 3rd day he was much better.  At that time, his potassium was up to 3.4.  He was sitting up, eating, and much more alert and ready for discharge home.  FINAL DISCHARGE DIAGNOSES: 1. Presumptive urinary tract infection. 2. Dehydration. 3. Hypokalemia. 4. Senile dementia. 5. Benign essential hypertension. 6. Hypothyroidism. 7. Status post prostate carcinoma.  He is discharged home on:  1. Cefuroxime 500 mg b.i.d. for a 10-day course. 2. Potassium chloride 10 mEq b.i.d. for a 10-day course.  He is to continue:  1.  Amlodipine 10 mg daily. 2. Aspirin 81 mg daily. 3. Clopidogrel 75 mg daily. 4. Gabapentin 100 mg q.a.m. 5. Levothyroxine 112 mcg daily. 6. Namenda 10 mg b.i.d. 7. Loratadine 10 mg daily for allergy p.r.n. 8. Naproxen 500 mg b.i.d. p.r.n. back pain.  Followup will be in the office within 1-2 weeks.     Mila Homer. Sudie Bailey, M.D.     SDK/MEDQ  D:  10/01/2012  T:  10/02/2012  Job:  [id number]

## 2012-10-02 NOTE — Progress Notes (Signed)
PROGRESS NOTE  JACQUES FIFE NWG:[id number] DOB: 1921/05/15 DOA: 10/01/2012 PCP: Milana Obey, MD  Brief narrative: 79 yr old male admitted 4.30.14 after recent [facility] stay for apparent UTI.  Was d/c home off of usual ativan and then developed a fall with Sz like activity.  He was admitted for further work-up inclusive of an MRI brain and an EEG. Patient's wife reports a subsequent to TIAs the patient sustained in 2012 and 13, he became more confused and was diagnosed eventually with what sounds like dementia. He was placed on Aricept but this seems to make him have more seizures and this was promptly discontinued.  Past medical history-As per Problem list Chart reviewed as below- Admission 4.28 for UTI-cultures didn;t show any growth Admission 03/02/12 for ?TIA H/o Prostate cacncer-TURP in 1992 + Prostate Radiotherapy H/o Hypothyroid CKD stage 3  Consultants:  Neurology  Procedures:  CT head 4.30  Antibiotics:  None currently   Subjective  Doing fair this morning. Sitting in bed. Wife at bedside. Denies chest pain shortness of breath. Contaminated date contaminate year to come in the president. Speech is a little slow. He does have some  echolalia.    Objective    Interim History: nad  Telemetry: Normal sinus rhythm  Objective: Filed Vitals:   10/02/12 0000 10/02/12 0045 10/02/12 0133 10/02/12 0438  BP: 106/59 135/67 126/68 128/64  Pulse: 73 85 72 70  Temp:   97.6 F (36.4 C) 98.1 F (36.7 C)  TempSrc:   Oral Oral  Resp: 12 20 16    Height:   5\' 11"  (1.803 m)   Weight:   90.1 kg (198 lb 10.2 oz)   SpO2: 99% 100% 100% 100%    Intake/Output Summary (Last 24 hours) at 10/02/12 0752 Last data filed at 10/02/12 0437  Gross per 24 hour  Intake    580 ml  Output    900 ml  Net   -320 ml    Exam:  General: Alert pleasant oriented Cardiovascular: S1-S2 no murmur rub or gallop Respiratory: Clinically clear Abdomen: Soft Skin no noted rash or  swelling Neuro grossly intact  Data Reviewed: Basic Metabolic Panel:  Recent Labs Lab 09/29/12 1539 09/30/12 0444 10/01/12 0441 10/01/12 2142 10/01/12 2156 10/02/12 0500  NA 135 136 134* 133* 136 134*  K 2.4* 3.2* 3.4* 3.8 3.8 3.2*  CL 90* 96 96 96 99 99  CO2 31 29 27 19   --  27  GLUCOSE 108* 105* 101* 108* 107* 119*  BUN 26* 24* 19 17 18 15   CREATININE 1.96* 1.80* 1.55* 1.49* 1.50* 1.51*  CALCIUM 9.5 9.2 9.2 9.5  --  8.9   Liver Function Tests:  Recent Labs Lab 10/01/12 2142  AST 57*  ALT 74*  ALKPHOS 68  BILITOT 0.5  PROT 7.0  ALBUMIN 3.4*   No results found for this basename: LIPASE, AMYLASE,  in the last 168 hours No results found for this basename: AMMONIA,  in the last 168 hours CBC:  Recent Labs Lab 09/29/12 1539 10/01/12 0441 10/01/12 2142 10/01/12 2156 10/02/12 0500  WBC 11.6* 8.9 11.3*  --  8.4  NEUTROABS 7.9*  --  7.8*  --   --   HGB 10.6* 11.3* 10.9* 12.2* 9.7*  HCT 31.8* 34.2* 32.6* 36.0* 28.9*  MCV 87.6 88.8 86.0  --  85.8  PLT 221 202 189  --  183   Cardiac Enzymes:  Recent Labs Lab 10/01/12 2143  TROPONINI <0.30   BNP: No components found  with this basename: POCBNP,  CBG: No results found for this basename: GLUCAP,  in the last 168 hours  Recent Results (from the past 240 hour(s))  URINE CULTURE     Status: None   Collection Time    09/29/12  2:58 PM      Result Value Range Status   Specimen Description URINE, CLEAN CATCH   Final   Special Requests NONE   Final   Culture  Setup Time 09/30/2012 02:31   Final   Colony Count >=100,000 COLONIES/ML   Final   Culture     Final   Value: Multiple bacterial morphotypes present, none predominant. Suggest appropriate recollection if clinically indicated.   Report Status 10/01/2012 FINAL   Final     Studies:              All Imaging reviewed and is as per above notation   Scheduled Meds: . sodium chloride   Intravenous STAT  . amLODipine  10 mg Oral Daily  . aspirin EC  81 mg  Oral Daily  . cefUROXime  500 mg Oral BID WC  . clopidogrel  75 mg Oral Q breakfast  . gabapentin  100 mg Oral QHS  . heparin  5,000 Units Subcutaneous Q8H  . levETIRAcetam  500 mg Intravenous Q12H  . levothyroxine  112 mcg Oral QAC breakfast  . LORazepam  1 mg Oral QHS  . memantine  10 mg Oral BID  . potassium chloride  10 mEq Oral BID  . sodium chloride  3 mL Intravenous Q12H   Continuous Infusions:    Assessment/Plan: 1. Seizures-potentially secondary to TIAs. MRI brain is EEG pending. Appreciate neurology input. Patient to remain on Keppra 500 mg twice a day. Ativan will be weaned to 1 mg for 7 days and then discontinued. 2. Recent UTI-patient on Ceftin 500 twice a day. Continue for 2 more days. Repeat CBC a.m. follow repeat cultures. 3. Hypertension-continue amlodipine 10 mg daily 4. History of TIAs-continue aspirin 81 mg daily, Plavix 75 mg daily 5. Possible dementia-continue memantine 10 mg twice a day 6. Hypokalemia-continue K. Dur 10 mEq 2 times a day 7. Hypothyroidism continue Synthroid 112 mcg daily  Code Status: Full Family Communication: Discussed with wife at bedside Disposition Plan: Obs   Pleas Koch, MD  [facility] Pager [phone] 10/02/2012, 7:52 AM    LOS: 1 day

## 2012-10-02 NOTE — Progress Notes (Signed)
EEG  Completed; results pending. 

## 2012-10-02 NOTE — ED Notes (Signed)
Code stroke Cancelled due to not being a stroke

## 2012-10-02 NOTE — Consult Note (Signed)
Reason for Consult: Concern for stroke Referring Physician: Rancour, S  CC: Fall  History is obtained from: Patient, wife  HPI: [name] is a 79 y.o. male with a history of seizures who was admitted with urinary tract infection to [facility] and discharged earlier today. He has a history of dementia and presented with confusion and delirium. Of note, he was taking Ativan 2 mg twice a day which the family states were for his seizures. This was stopped on admission on 4/29.  Tonight, his wife heard him fall and found him to be having convulsive activity. Following this he was confused, and on EMS arrival appear to be slurring his speech and therefore a code stroke was called.  That he sometimes will have seizures consisting of unilateral facial twitching.  LKW: Unclear tpa given: no, seizure at onset, not stroke   ROS: A 14 point ROS was performed and is negative except as noted in the HPI.  Past Medical History  Diagnosis Date  . HTN (hypertension)   . Thyroid disease   . Stroke     TIA  . Cancer     Family History: DM, cancer  Social History: Tob: Denies  Exam: Current vital signs: BP 135/67  Pulse 85  Temp(Src) 98 F (36.7 C)  Resp 20  SpO2 100% Vital signs in last 24 hours: Temp:  [98 F (36.7 C)] 98 F (36.7 C) (04/30 2325) Pulse Rate:  [73-85] 85 (05/01 0045) Resp:  [12-20] 20 (05/01 0045) BP: (106-175)/(59-82) 135/67 mmHg (05/01 0045) SpO2:  [98 %-100 %] 100 % (05/01 0045)  General: In bed, no apparent distress CV: Regular rate and rhythm Mental Status: Patient is awake, alert, oriented to person, place, month, date, year. He is amnestic to the events that brought him to the hospital. He denies any event prompting this visit. No signs of aphasia or neglect. Cranial Nerves: II: Visual Fields are full. Pupils are equal, round, and reactive to light.  Discs without papilledema. III,IV, VI: EOMI without ptosis or diploplia.  V: Facial  sensation is symmetric to temperature VII: Facial movement is symmetric.  VIII: hearing is intact to voice X: Uvula elevates symmetrically XI: Shoulder shrug is symmetric. XII: tongue is midline without atrophy or fasciculations.  Motor: Tone is normal. Bulk is normal. 5/5 strength was present in all four extremities.  Sensory: Sensation is symmetric to light touch and temperature in the arms and legs. Deep Tendon Reflexes: 2+ and symmetric in the biceps and patellae.  Plantars: Toes are downgoing bilaterally.  Cerebellar: FNF intact bilaterally Gait: Not tested due to patient safety concern  I have reviewed labs in epic and the results pertinent to this consultation are: Mildly elevated creatinine  I have reviewed the images obtained: CT head-previous lacunar infarcts, atrophy  Impression: 79 year old male with seizure in the setting of recent discontinuation of Ativan. He does have a history of partial seizures which would not be consistent with benzo withdrawal alone, though withdrawal could be contributing to the event from tonight.  Recommendations: 1) would wean from Ativan, could give 1 mg each bedtime x7 days. 2) start Keppra 500 twice a day 3) EEG and MRI   Ritta Slot, MD [facility] [phone]  If 7pm- 7am, please page neurology on call at [phone].

## 2012-10-02 NOTE — H&P (Signed)
[facility] History and Physical  [name] ZOX:[id number] DOB: 1920-11-17 DOA: 10/01/2012  Referring physician: ED PCP: Milana Obey, MD  Specialists: None  Chief Complaint: Seizure  HPI: [name] is a 79 y.o. male who presents from home with a fall that happened at 7:30pm this evening.  He had just been discharged from [facility] where he head been treated for a UTI.  Per his wife and daughter he had not been on his ativan while there at the hospital and so hadnt taken any today.  He takes ativan long term for a history of seizure disorder.  Fall was un witnessed but family upon discovering the patient noted seizure like activity that resolved spontaneously.  Patient was taken to the ED here at [facility].  In the ED patient initially was noted to have slurred speech and L sided weakness which is now resolved, neurology saw the patient but feels that this is more likely to be secondary to a seizure (given the witnessed seizure activity, resolution, and known h/o seizure disorder), than a new CVA.  He was started on Keppra and a dose of ativan was given in the ED, Hospitalist has been asked to admit.  Review of Systems: 12 systems reviewed and otherwise negative.  Past Medical History  Diagnosis Date  . HTN (hypertension)   . Thyroid disease   . Stroke     TIA  . Cancer    Past Surgical History  Procedure Laterality Date  . Hernia repair      x 2  . Prostate surgery    . Back surgery      cervical spine   Social History:  reports that he has never smoked. He does not have any smokeless tobacco history on file. He reports that he does not drink alcohol or use illicit drugs.   Allergies  Allergen Reactions  . Aspirin     REACTION: "elevated pulse". Cannot take any strength higher than 81mg     Family History  Problem Relation Age of Onset  . Arthritis    . Cancer    . Asthma    . Diabetes       Prior to Admission medications   Medication Sig Start  Date End Date Taking? Authorizing Provider  amLODipine (NORVASC) 10 MG tablet Take 10 mg by mouth daily.   Yes Historical Provider, MD  aspirin EC 81 MG tablet Take 81 mg by mouth daily.   Yes Historical Provider, MD  cefUROXime (CEFTIN) 250 MG tablet Take 2 tablets (500 mg total) by mouth 2 (two) times daily. 10/01/12  Yes Milana Obey, MD  clopidogrel (PLAVIX) 75 MG tablet Take 1 tablet (75 mg total) by mouth daily with breakfast. 03/05/12  Yes Milana Obey, MD  gabapentin (NEURONTIN) 100 MG capsule Take 1 capsule (100 mg total) by mouth at bedtime. 10/16/11 10/15/12 Yes Vickki Hearing, MD  levothyroxine (SYNTHROID, LEVOTHROID) 112 MCG tablet Take 112 mcg by mouth 2 (two) times daily.    Yes Historical Provider, MD  memantine (NAMENDA) 10 MG tablet Take 10 mg by mouth 2 (two) times daily.   Yes Historical Provider, MD  potassium chloride (K-DUR) 10 MEQ tablet Take 1 tablet (10 mEq total) by mouth 2 (two) times daily. 10/01/12  Yes Milana Obey, MD   Physical Exam: Filed Vitals:   10/01/12 2325  Temp: 98 F (36.7 C)    General:  NAD, resting comfortably in bed Eyes: PEERLA EOMI ENT: mucous membranes  moist Neck: supple w/o JVD Cardiovascular: RRR w/o MRG Respiratory: CTA B Abdomen: soft, nt, nd, bs+ Skin: no rash nor lesion Musculoskeletal: MAE, full ROM all 4 extremities Psychiatric: normal tone and affect Neurologic: sedated from seizure medications but arouses to voice, grossly non-focal, smile symmetrical, 5/5 strength throughout.  Labs on Admission:  Basic Metabolic Panel:  Recent Labs Lab 09/29/12 1539 09/30/12 0444 10/01/12 0441 10/01/12 2142 10/01/12 2156  NA 135 136 134* 133* 136  K 2.4* 3.2* 3.4* 3.8 3.8  CL 90* 96 96 96 99  CO2 31 29 27 19   --   GLUCOSE 108* 105* 101* 108* 107*  BUN 26* 24* 19 17 18   CREATININE 1.96* 1.80* 1.55* 1.49* 1.50*  CALCIUM 9.5 9.2 9.2 9.5  --    Liver Function Tests:  Recent Labs Lab 10/01/12 2142  AST 57*   ALT 74*  ALKPHOS 68  BILITOT 0.5  PROT 7.0  ALBUMIN 3.4*   No results found for this basename: LIPASE, AMYLASE,  in the last 168 hours No results found for this basename: AMMONIA,  in the last 168 hours CBC:  Recent Labs Lab 09/29/12 1539 10/01/12 0441 10/01/12 2142 10/01/12 2156  WBC 11.6* 8.9 11.3*  --   NEUTROABS 7.9*  --  7.8*  --   HGB 10.6* 11.3* 10.9* 12.2*  HCT 31.8* 34.2* 32.6* 36.0*  MCV 87.6 88.8 86.0  --   PLT 221 202 189  --    Cardiac Enzymes:  Recent Labs Lab 10/01/12 2143  TROPONINI <0.30    BNP (last 3 results) No results found for this basename: PROBNP,  in the last 8760 hours CBG: No results found for this basename: GLUCAP,  in the last 168 hours  Radiological Exams on Admission: Ct Head Wo Contrast  10/01/2012  *RADIOLOGY REPORT*  Clinical Data:  Altered mental status.  Fall.  Left-sided weakness all day.  Slurred speech.  History of TIAs.  CT HEAD WITHOUT CONTRAST CT CERVICAL SPINE WITHOUT CONTRAST  Technique:  Multidetector CT imaging of the head and cervical spine was performed following the standard protocol without intravenous contrast.  Multiplanar CT image reconstructions of the cervical spine were also generated.  Comparison:  CT head 03/02/2012.  MRI brain 07/25/2006.  CT HEAD  Findings: Diffuse cerebral atrophy.  Mild ventricular dilatation suggesting central atrophy.  Low attenuation changes in the deep white matter consistent with small vessel ischemic change.  Old lacunar infarcts in the thalami.  No mass effect or midline shift. No abnormal extra-axial fluid collections.  Gray-white matter junctions are distinct.  Basal cisterns are not effaced.  No evidence of acute intracranial hemorrhage.  No significant change in the appearance intracranial contents since the previous study. No depressed skull fractures.  Visualized paranasal sinuses and mastoid air cells are not opacified.  Vascular calcifications.  IMPRESSION: No acute intracranial  abnormalities.  Chronic atrophy and small vessel ischemic changes.  Old lacunar infarcts.  CT CERVICAL SPINE  Findings: Reversal of the usual cervical lordosis which is likely due to patient positioning but ligamentous injury or muscle spasm can also have this appearance.  No anterior subluxation.  Normal alignment of the facet joints.  Lateral masses of C1 appear symmetrical.  The odontoid process appears intact.  Degenerative changes throughout the cervical spine with narrowed cervical interspaces and endplate hypertrophic changes.  Degenerative disc disease at C4-5.  Degenerative changes in the cervical facet joints and C1-2 level.  No vertebral compression deformities.  No prevertebral soft tissue swelling.  No focal bone lesion or bone destruction.  IMPRESSION: Diffuse degenerative change throughout the cervical spine.  No displaced fractures identified.  Code stroke results were telephoned to Dr. Manus Gunning in the emergency apartment at 2204 hours on 10/01/2012.   Original Report Authenticated By: Burman Nieves, M.D.    Ct Cervical Spine Wo Contrast  10/01/2012  *RADIOLOGY REPORT*  Clinical Data:  Altered mental status.  Fall.  Left-sided weakness all day.  Slurred speech.  History of TIAs.  CT HEAD WITHOUT CONTRAST CT CERVICAL SPINE WITHOUT CONTRAST  Technique:  Multidetector CT imaging of the head and cervical spine was performed following the standard protocol without intravenous contrast.  Multiplanar CT image reconstructions of the cervical spine were also generated.  Comparison:  CT head 03/02/2012.  MRI brain 07/25/2006.  CT HEAD  Findings: Diffuse cerebral atrophy.  Mild ventricular dilatation suggesting central atrophy.  Low attenuation changes in the deep white matter consistent with small vessel ischemic change.  Old lacunar infarcts in the thalami.  No mass effect or midline shift. No abnormal extra-axial fluid collections.  Gray-white matter junctions are distinct.  Basal cisterns are not  effaced.  No evidence of acute intracranial hemorrhage.  No significant change in the appearance intracranial contents since the previous study. No depressed skull fractures.  Visualized paranasal sinuses and mastoid air cells are not opacified.  Vascular calcifications.  IMPRESSION: No acute intracranial abnormalities.  Chronic atrophy and small vessel ischemic changes.  Old lacunar infarcts.  CT CERVICAL SPINE  Findings: Reversal of the usual cervical lordosis which is likely due to patient positioning but ligamentous injury or muscle spasm can also have this appearance.  No anterior subluxation.  Normal alignment of the facet joints.  Lateral masses of C1 appear symmetrical.  The odontoid process appears intact.  Degenerative changes throughout the cervical spine with narrowed cervical interspaces and endplate hypertrophic changes.  Degenerative disc disease at C4-5.  Degenerative changes in the cervical facet joints and C1-2 level.  No vertebral compression deformities.  No prevertebral soft tissue swelling.  No focal bone lesion or bone destruction.  IMPRESSION: Diffuse degenerative change throughout the cervical spine.  No displaced fractures identified.  Code stroke results were telephoned to Dr. Manus Gunning in the emergency apartment at 2204 hours on 10/01/2012.   Original Report Authenticated By: Burman Nieves, M.D.     EKG: Independently reviewed.  Assessment/Plan Principal Problem:   Seizure Active Problems:   HYPERTENSION   SEIZURE DISORDER   1. Seizure - due to being taken off of his ativan which he takes chronically for seizure disorder we think, neurology has seen patient and started him on keppra, will leave him on this for now, got one dose of ativan in ED, will defer to neurology wether this should be fully restarted (since patients first hospital visit was for falls / UTI and ativan is known to increase fall risk).  Seizure precautions, EEG ordered, MRI brain ordered, admitted to  tele. 2. HTN - continue home meds.  Seen by Neurology in consult who recommended starting the patient on keppra.  Code Status: Full Code (must indicate code status--if unknown or must be presumed, indicate so) Family Communication: Spoke with wife and daughter at bedside (indicate person spoken with, if applicable, with phone number if by telephone) Disposition Plan: Admit to obs (indicate anticipated LOS)  Time spent: 70 min  Dwain Huhn M. [facility] Pager [phone]  If 7PM-7AM, please contact night-coverage [email or web address] Password [email or web address] 10/02/2012, 12:22 AM

## 2012-10-02 NOTE — Progress Notes (Signed)
History: Discharged from [facility] 10/01/2012 with UTI. Readmitted with anticonvulsant behavior. Family says that he has been on namenda and ativan 2mg  daily for about 6 years. They think that he stopped the ativan a few days ago and that he may be in withdrawals. In addition. Prior to being on namenda, he was on aricept and developed seizures and it was felt at that time that he had a reaction to aricept in the form of seizure and was placed on namenda and ativan and has taken it since that time.   Daughters say that they saw some arm twitching earlier today. EEG has been done. He has been placed on Keppra and is being tapered off of ativan. One family member stated that she has gone through all of his medications and she found medications from 2010 and that all of his pills were intermixed. She says it is not clear what he has been taking.  Subjective: Patient lying in bed. Awake and alert.  Objective: BP 177/77  Pulse 69  Temp(Src) 98.4 F (36.9 C) (Oral)  Resp 18  Ht 5\' 11"  (1.803 m)  Wt 90.1 kg (198 lb 10.2 oz)  BMI 27.72 kg/m2  SpO2 98%  CBGs  Recent Labs  10/01/12 2207  GLUCAP 103*     Medications: Scheduled: . amLODipine  10 mg Oral Daily  . aspirin EC  81 mg Oral Daily  . cefUROXime  500 mg Oral BID WC  . clopidogrel  75 mg Oral Q breakfast  . gabapentin  100 mg Oral QHS  . heparin  5,000 Units Subcutaneous Q8H  . levETIRAcetam  500 mg Intravenous Q12H  . levothyroxine  112 mcg Oral QAC breakfast  . LORazepam  1 mg Oral QHS  . memantine  10 mg Oral BID  . potassium chloride  10 mEq Oral BID  . sodium chloride  3 mL Intravenous Q12H    Neurologic Exam: Mental Status: Alert, oriented, thought content appropriate.  Speech fluent without evidence of aphasia. Able to follow 3 step commands without difficulty. No fasciculations noted. Cranial Nerves: II- Visual fields grossly intact. III/IV/VI-Extraocular movements intact. V/VII-Smile symmetric VIII-hearing grossly  intact IX/X-normal gag XI-shoulder shrug symmetric XII-midline tongue extension Motor: 5/5 bilaterally with normal tone and bulk Sensory: Light touch intact throughout, bilaterally Plantars: Downgoing bilaterally Cerebellar: Normal finger-to-nose  Lab Results: CBC:  Recent Labs Lab 09/29/12 1539  10/01/12 2142 10/01/12 2156 10/02/12 0500  WBC 11.6*  < > 11.3*  --  8.4  NEUTROABS 7.9*  --  7.8*  --   --   HGB 10.6*  < > 10.9* 12.2* 9.7*  HCT 31.8*  < > 32.6* 36.0* 28.9*  MCV 87.6  < > 86.0  --  85.8  PLT 221  < > 189  --  183  < > = values in this interval not displayed. Basic Metabolic Panel:  Recent Labs Lab 10/01/12 2142 10/01/12 2156 10/02/12 0500  NA 133* 136 134*  K 3.8 3.8 3.2*  CL 96 99 99  CO2 19  --  27  GLUCOSE 108* 107* 119*  BUN 17 18 15   CREATININE 1.49* 1.50* 1.51*  CALCIUM 9.5  --  8.9   Liver Function Tests:  Recent Labs Lab 10/01/12 2142  AST 57*  ALT 74*  ALKPHOS 68  BILITOT 0.5  PROT 7.0  ALBUMIN 3.4*   Hemoglobin A1C: No results found for this basename: HGBA1C,  in the last 168 hours Fasting Lipid Panel: No results found for this  basename: CHOL, HDL, LDLCALC, TRIG, CHOLHDL, LDLDIRECT,  in the last 168 hours Thyroid Function Tests: No results found for this basename: TSH, T4TOTAL, FREET4, T3FREE, THYROIDAB,  in the last 168 hours Coagulation:  Recent Labs Lab 10/01/12 2142  LABPROT 13.6  INR 1.05   10/01/2012   CT HEAD No acute intracranial abnormalities.  Chronic atrophy and small vessel ischemic changes.  Old lacunar infarcts.   CT CERVICAL SPINE Diffuse degenerative change throughout the cervical spine.  No displaced fractures identified.    Mr Brain Wo Contrast 10/02/2012 No acute infarct.  Small area of blood breakdown products right pons may be related to result of prior hemorrhagic ischemia, remote trauma or tiny cavernoma.  Global atrophy without hydrocephalus.  Remote thalamic infarcts.  Moderate small vessel disease  type changes.  Cervical spondylotic changes with spinal stenosis and mild cord flattening C3-4.  Transverse ligament hypertrophy with mild flattening the ventral aspect of the cervical medullary junction.   Original Report Authenticated By: Lacy Duverney, M.D.    EEG-pending   Assessment/Plan: 79 yo male with dementia with recent hospitalization for UTI. Family concerns for patient not taking ativan at home over past 2-3 days and question if he is withdrawing. Ativan re-instituted as a taper. Keppra started. EEG pending. Plan discussed with daughters.   LOS: 1 day  Job Founds, MBA, [facility] Pager [phone] 10/02/2012  4:32 PM

## 2012-10-03 LAB — URINE CULTURE

## 2012-10-03 MED ORDER — LEVETIRACETAM 750 MG PO TABS: 750.0000 mg | ORAL_TABLET | Freq: Two times a day (BID) | ORAL | Status: DC

## 2012-10-03 MED ORDER — LORAZEPAM 1 MG PO TABS: 1.0000 mg | ORAL_TABLET | Freq: Every day | ORAL | Status: DC

## 2012-10-03 NOTE — Discharge Summary (Signed)
Physician Discharge Summary  HELDER Gregory JXB:[id number] DOB: Jul 15, 1920 DOA: 10/01/2012  PCP: Todd Obey, MD  Admit date: 10/01/2012 Discharge date: 10/03/2012  Time spent: 35 minutes  Recommendations for Outpatient Follow-up:  1. Patient is to continue Keppra 750 twice a day, 2. Taper off of Ativan next 6 days to 1 mg q. HS when necessary at night 3. Patient will need home health PT OT and rolling walker 4.  Complete course of cefepime as per Dr. Sudie Gregory  Discharge Diagnoses:  Principal Problem:   Seizure Active Problems:   HYPERTENSION   SEIZURE DISORDER   Discharge Condition: Stable  Diet recommendation: Healthy low-salt  Filed Weights   10/02/12 0133 10/03/12 0624  Weight: 90.1 kg (198 lb 10.2 oz) 90.1 kg (198 lb 10.2 oz)    History of present illness:  79 yr old male admitted 4.30.14 after recent [facility] stay for apparent UTI. Was d/c home off of usual ativan and then developed a fall with Sz like activity. He was admitted for further work-up inclusive of an MRI brain and an EEG. Patient's wife reports a subsequent to TIAs the patient sustained in 2012 and 13, he became more confused and was diagnosed eventually with what sounds like dementia. He was placed on Aricept but this seems to make him have more seizures and this was promptly discontinued.  Hospital Course:  1. Seizures-potentially secondary to TIAs. MRI brain is EEG pending. Appreciate neurology input. Patient to remain on Keppra 750 mg twice a day. Ativan will be weaned to 1 mg for the next 6 daysdays and then discontinued. It was noted that her seizure activity per nursing  he has episodes of confusion and patient's been medicated by family because of this for what they deem to be seizures  Family will need education regarding this 2. Recent UTI- with cultures shoing multiple bact pehnotypes patient on Ceftin 500 twice a day. Complete 10/05/12 days. Repeat CBC a.m. follow repeat  cultures. 3. Hypertension-continue amlodipine 10 mg daily 4. History of TIAs-continue aspirin 81 mg daily, Plavix 75 mg daily 5. Possible dementia-continue memantine 10 mg twice a day 6. Hypokalemia-continue K. Dur 10 mEq 2 times a day 7. Hypothyroidism continue Synthroid 112 mcg daily   Past medical history-As per Problem list  Chart reviewed as below-  Admission 4.28 for UTI-cultures didn;t show any growth  Admission 03/02/12 for ?TIA  H/o Prostate cacncer-TURP in 1992 + Prostate Radiotherapy  H/o Hypothyroid  CKD stage 3  Consultants:  Neurology Procedures:  CT head 4.30 Antibiotics:  None currently   Discharge Exam: Filed Vitals:   10/02/12 1300 10/02/12 1500 10/02/12 2039 10/03/12 0624  BP: 123/68 177/77 133/72 122/62  Pulse: 69  80 76  Temp: 98.4 F (36.9 C)  98.3 F (36.8 C) 98 F (36.7 C)  TempSrc: Oral  Oral Oral  Resp: 18  18   Height:      Weight:    90.1 kg (198 lb 10.2 oz)  SpO2: 98%  96% 100%   Well oriented, no apparent distress  General:  Alert, sometimes confused Cardiovascular: s1 s 2no m/r/g Respiratory:  clear  Discharge Instructions  Discharge Orders   Future Orders Complete By Expires     Diet - low sodium heart healthy  As directed     Increase activity slowly  As directed         Medication List    TAKE these medications       amLODipine 10 MG tablet  Commonly  known as:  NORVASC  Take 10 mg by mouth daily.     aspirin EC 81 MG tablet  Take 81 mg by mouth daily.     cefUROXime 250 MG tablet  Commonly known as:  CEFTIN  Take 2 tablets (500 mg total) by mouth 2 (two) times daily.     clopidogrel 75 MG tablet  Commonly known as:  PLAVIX  Take 1 tablet (75 mg total) by mouth daily with breakfast.     gabapentin 100 MG capsule  Commonly known as:  NEURONTIN  Take 1 capsule (100 mg total) by mouth at bedtime.     hydrochlorothiazide 25 MG tablet  Commonly known as:  HYDRODIURIL  Take 25 mg by mouth daily.      levETIRAcetam 750 MG tablet  Commonly known as:  KEPPRA  Take 1 tablet (750 mg total) by mouth 2 (two) times daily.     levothyroxine 112 MCG tablet  Commonly known as:  SYNTHROID, LEVOTHROID  Take 112 mcg by mouth 2 (two) times daily.     LORazepam 1 MG tablet  Commonly known as:  ATIVAN  Take 1 tablet (1 mg total) by mouth at bedtime.     memantine 10 MG tablet  Commonly known as:  NAMENDA  Take 10 mg by mouth 2 (two) times daily.     potassium chloride 10 MEQ tablet  Commonly known as:  K-DUR  Take 1 tablet (10 mEq total) by mouth 2 (two) times daily.       Allergies  Allergen Reactions  . Aspirin     REACTION: "elevated pulse". Cannot take any strength higher than 81mg        Follow-up Information   Follow up with Todd Obey, MD. Schedule an appointment as soon as possible for a visit in 2 weeks.   Contact information:   [address] [place] [address] [phone]        The results of significant diagnostics from this hospitalization (including imaging, microbiology, ancillary and laboratory) are listed below for reference.    Significant Diagnostic Studies: Dg Chest 2 View  09/29/2012  *RADIOLOGY REPORT*  Clinical Data: Cough and altered mental status.  CHEST - 2 VIEW  Comparison:  03/02/2012  Findings:  The heart size and mediastinal contours are within normal limits.  Both lungs are clear.  The visualized skeletal structures are unremarkable.  IMPRESSION: No active cardiopulmonary disease.   Original Report Authenticated By: Richarda Overlie, M.D.    Ct Head Wo Contrast  10/01/2012  *RADIOLOGY REPORT*  Clinical Data:  Altered mental status.  Fall.  Left-sided weakness all day.  Slurred speech.  History of TIAs.  CT HEAD WITHOUT CONTRAST CT CERVICAL SPINE WITHOUT CONTRAST  Technique:  Multidetector CT imaging of the head and cervical spine was performed following the standard protocol without intravenous contrast.  Multiplanar CT image  reconstructions of the cervical spine were also generated.  Comparison:  CT head 03/02/2012.  MRI brain 07/25/2006.  CT HEAD  Findings: Diffuse cerebral atrophy.  Mild ventricular dilatation suggesting central atrophy.  Low attenuation changes in the deep white matter consistent with small vessel ischemic change.  Old lacunar infarcts in the thalami.  No mass effect or midline shift. No abnormal extra-axial fluid collections.  Gray-white matter junctions are distinct.  Basal cisterns are not effaced.  No evidence of acute intracranial hemorrhage.  No significant change in the appearance intracranial contents since the previous study. No depressed skull fractures.  Visualized paranasal sinuses and mastoid air cells are not opacified.  Vascular calcifications.  IMPRESSION: No acute intracranial abnormalities.  Chronic atrophy and small vessel ischemic changes.  Old lacunar infarcts.  CT CERVICAL SPINE  Findings: Reversal of the usual cervical lordosis which is likely due to patient positioning but ligamentous injury or muscle spasm can also have this appearance.  No anterior subluxation.  Normal alignment of the facet joints.  Lateral masses of C1 appear symmetrical.  The odontoid process appears intact.  Degenerative changes throughout the cervical spine with narrowed cervical interspaces and endplate hypertrophic changes.  Degenerative disc disease at C4-5.  Degenerative changes in the cervical facet joints and C1-2 level.  No vertebral compression deformities.  No prevertebral soft tissue swelling.  No focal bone lesion or bone destruction.  IMPRESSION: Diffuse degenerative change throughout the cervical spine.  No displaced fractures identified.  Code stroke results were telephoned to Dr. Manus Gunning in the emergency apartment at 2204 hours on 10/01/2012.   Original Report Authenticated By: Burman Nieves, M.D.    Ct Cervical Spine Wo Contrast  10/01/2012  *RADIOLOGY REPORT*  Clinical Data:  Altered mental status.   Fall.  Left-sided weakness all day.  Slurred speech.  History of TIAs.  CT HEAD WITHOUT CONTRAST CT CERVICAL SPINE WITHOUT CONTRAST  Technique:  Multidetector CT imaging of the head and cervical spine was performed following the standard protocol without intravenous contrast.  Multiplanar CT image reconstructions of the cervical spine were also generated.  Comparison:  CT head 03/02/2012.  MRI brain 07/25/2006.  CT HEAD  Findings: Diffuse cerebral atrophy.  Mild ventricular dilatation suggesting central atrophy.  Low attenuation changes in the deep white matter consistent with small vessel ischemic change.  Old lacunar infarcts in the thalami.  No mass effect or midline shift. No abnormal extra-axial fluid collections.  Gray-white matter junctions are distinct.  Basal cisterns are not effaced.  No evidence of acute intracranial hemorrhage.  No significant change in the appearance intracranial contents since the previous study. No depressed skull fractures.  Visualized paranasal sinuses and mastoid air cells are not opacified.  Vascular calcifications.  IMPRESSION: No acute intracranial abnormalities.  Chronic atrophy and small vessel ischemic changes.  Old lacunar infarcts.  CT CERVICAL SPINE  Findings: Reversal of the usual cervical lordosis which is likely due to patient positioning but ligamentous injury or muscle spasm can also have this appearance.  No anterior subluxation.  Normal alignment of the facet joints.  Lateral masses of C1 appear symmetrical.  The odontoid process appears intact.  Degenerative changes throughout the cervical spine with narrowed cervical interspaces and endplate hypertrophic changes.  Degenerative disc disease at C4-5.  Degenerative changes in the cervical facet joints and C1-2 level.  No vertebral compression deformities.  No prevertebral soft tissue swelling.  No focal bone lesion or bone destruction.  IMPRESSION: Diffuse degenerative change throughout the cervical spine.  No  displaced fractures identified.  Code stroke results were telephoned to Dr. Manus Gunning in the emergency apartment at 2204 hours on 10/01/2012.   Original Report Authenticated By: Burman Nieves, M.D.    Mr Brain Wo Contrast  10/02/2012  *RADIOLOGY REPORT*  Clinical Data: Fall last night.  History of seizures. Hypertension.  Prostate cancer.  MRI HEAD WITHOUT CONTRAST  Technique:  Multiplanar, multiecho pulse sequences of the brain and surrounding structures were obtained according to standard protocol without intravenous contrast.  Comparison: 10/01/2012 CT.  03/03/2012 MR.  Findings: No acute infarct.  Small area of blood breakdown  products right pons may be related to result of prior hemorrhagic ischemia, remote trauma or tiny cavernoma.  No other intracranial hemorrhage noted.  Global atrophy without hydrocephalus.  Remote thalamic infarcts. Moderate small vessel disease type changes.  No intracranial mass lesion (other than possible tiny cavernoma right pons) detected on this unenhanced exam.  Major intracranial vascular structures are patent.  Cervical spondylotic changes with spinal stenosis and mild cord flattening C3-4.  Transverse ligament hypertrophy with mild flattening the ventral aspect of the cervical medullary junction. Cerebellar tonsils minimally low-lying at the upper range of normal limits.  IMPRESSION: No acute infarct.  Small area of blood breakdown products right pons may be related to result of prior hemorrhagic ischemia, remote trauma or tiny cavernoma.  Global atrophy without hydrocephalus.  Remote thalamic infarcts.  Moderate small vessel disease type changes.  Cervical spondylotic changes with spinal stenosis and mild cord flattening C3-4.  Transverse ligament hypertrophy with mild flattening the ventral aspect of the cervical medullary junction.   Original Report Authenticated By: Lacy Duverney, M.D.     Microbiology: Recent Results (from the past 240 hour(s))  URINE CULTURE      Status: None   Collection Time    09/29/12  2:58 PM      Result Value Range Status   Specimen Description URINE, CLEAN CATCH   Final   Special Requests NONE   Final   Culture  Setup Time 09/30/2012 02:31   Final   Colony Count >=100,000 COLONIES/ML   Final   Culture     Final   Value: Multiple bacterial morphotypes present, none predominant. Suggest appropriate recollection if clinically indicated.   Report Status 10/01/2012 FINAL   Final  URINE CULTURE     Status: None   Collection Time    10/02/12  1:25 AM      Result Value Range Status   Specimen Description URINE, RANDOM   Final   Special Requests NONE   Final   Culture  Setup Time 10/02/2012 10:26   Final   Colony Count NO GROWTH   Final   Culture NO GROWTH   Final   Report Status 10/03/2012 FINAL   Final     Labs: Basic Metabolic Panel:  Recent Labs Lab 09/29/12 1539 09/30/12 0444 10/01/12 0441 10/01/12 2142 10/01/12 2156 10/02/12 0500  NA 135 136 134* 133* 136 134*  K 2.4* 3.2* 3.4* 3.8 3.8 3.2*  CL 90* 96 96 96 99 99  CO2 31 29 27 19   --  27  GLUCOSE 108* 105* 101* 108* 107* 119*  BUN 26* 24* 19 17 18 15   CREATININE 1.96* 1.80* 1.55* 1.49* 1.50* 1.51*  CALCIUM 9.5 9.2 9.2 9.5  --  8.9   Liver Function Tests:  Recent Labs Lab 10/01/12 2142  AST 57*  ALT 74*  ALKPHOS 68  BILITOT 0.5  PROT 7.0  ALBUMIN 3.4*   No results found for this basename: LIPASE, AMYLASE,  in the last 168 hours No results found for this basename: AMMONIA,  in the last 168 hours CBC:  Recent Labs Lab 09/29/12 1539 10/01/12 0441 10/01/12 2142 10/01/12 2156 10/02/12 0500  WBC 11.6* 8.9 11.3*  --  8.4  NEUTROABS 7.9*  --  7.8*  --   --   HGB 10.6* 11.3* 10.9* 12.2* 9.7*  HCT 31.8* 34.2* 32.6* 36.0* 28.9*  MCV 87.6 88.8 86.0  --  85.8  PLT 221 202 189  --  183   Cardiac Enzymes:  Recent  Labs Lab 10/01/12 2143  TROPONINI <0.30   BNP: BNP (last 3 results) No results found for this basename: PROBNP,  in the last  8760 hours CBG:  Recent Labs Lab 10/01/12 2207  GLUCAP 103*       Signed:  Rhetta Mura  [facility] 10/03/2012, 10:39 AM

## 2012-10-03 NOTE — Care Management Note (Signed)
    Page 1 of 1   10/03/2012     2:32:50 PM   CARE MANAGEMENT NOTE 10/03/2012  Patient:  [name], [name]   Account Number:  [id number]  Date Initiated:  10/03/2012  Documentation initiated by:  GRAVES-BIGELOW,Nyrah Demos  Subjective/Objective Assessment:   Pt admitted with Seizure. Pt is from home with wife.     Action/Plan:   Pt was d/c today. CM did call wife at home and set up [facility] services. CM did make referral with [facility] for services. DME RW to be delivered to home. No further needs at this time.   Anticipated DC Date:  10/03/2012   Anticipated DC Plan:  HOME W HOME HEALTH SERVICES      DC Planning Services  CM consult      [facility] Choice  HOME HEALTH  DURABLE MEDICAL EQUIPMENT   Choice offered to / List presented to:  C-3 Spouse        HH arranged  HH-1 RN  HH-10 DISEASE MANAGEMENT  HH-2 PT      HH agency  [facility]   Status of service:  Completed, signed off Medicare Important Message given?   (If response is "NO", the following Medicare IM given date fields will be blank) Date Medicare IM given:   Date Additional Medicare IM given:    Discharge Disposition:  HOME W HOME HEALTH SERVICES  Per UR Regulation:  Reviewed for med. necessity/level of care/duration of stay  If discussed at Long Length of Stay Meetings, dates discussed:    Comments:

## 2012-10-03 NOTE — Evaluation (Signed)
Physical Therapy Evaluation Patient Details Name: [name] MRN: [id number] DOB: Dec 11, 1920 Today's Date: 10/03/2012 Time: 6295-2841 PT Time Calculation (min): 28 min  PT Assessment / Plan / Recommendation Clinical Impression  pt presents with r/o seizure activity.  pt generally unsteady and per family and pt this may be baseline.  Noted pt may be having difficulty managing meds at home despite family stating they are with him 24/7.  Would benefit from [facility] for A with med management and family ed, along with HHPT, RW, and family interested in finding out more about a tub transfer bench if it is covered by insurance.      PT Assessment  All further PT needs can be met in the next venue of care    Follow Up Recommendations  Home health PT;Supervision/Assistance - 24 hour    Does the patient have the potential to tolerate intense rehabilitation      Barriers to Discharge        Equipment Recommendations  Rolling walker with 5" wheels (Tub transfer bench)    Recommendations for Other Services     Frequency      Precautions / Restrictions Precautions Precautions: Fall Restrictions Weight Bearing Restrictions: No   Pertinent Vitals/Pain Denies pain.        Mobility  Bed Mobility Bed Mobility: Supine to Sit;Sitting - Scoot to Edge of Bed Supine to Sit: 4: Min assist;With rails Sitting - Scoot to [place] of Bed: 5: Supervision Details for Bed Mobility Assistance: pt needs increased time and use of rail to complete with Minimal A.   Transfers Transfers: Sit to Stand;Stand to Sit Sit to Stand: 4: Min guard;With upper extremity assist;From bed Stand to Sit: 4: Min guard;With upper extremity assist;To chair/3-in-1;With armrests Details for Transfer Assistance: cues for getting closer to chair prior to sitting.  pt mildly unsteady, but no LOB.   Ambulation/Gait Ambulation/Gait Assistance: 4: Min assist Ambulation Distance (Feet): 200 Feet Assistive device:  None Ambulation/Gait Assistance Details: pt unsteady with decreased balance as attention not focused on task.  pt with multiple LOB some pt able to correct, others required MinA from PT to correct.   Gait Pattern: Step-through pattern;Decreased stride length;Shuffle Stairs: Yes Stairs Assistance: 4: Min assist Stairs Assistance Details (indicate cue type and reason): cues to slow down and attend to safety.   Stair Management Technique: One rail Right;Forwards Number of Stairs: 10 Wheelchair Mobility Wheelchair Mobility: No    Exercises     PT Diagnosis:    PT Problem List: Decreased activity tolerance;Decreased balance;Decreased mobility;Decreased knowledge of use of DME;Decreased safety awareness;Decreased cognition PT Treatment Interventions:     PT Goals    Visit Information  Last PT Received On: 10/03/12 Assistance Needed: +1    Subjective Data  Subjective: There's always someone home with him.   Patient Stated Goal: Home   Prior Functioning  Home Living Lives With: Spouse;Family Available Help at Discharge: Family;Available 24 hours/day Type of Home: House Home Access: Stairs to enter [employer] of Steps: 2 Entrance Stairs-Rails: None Home Layout: One level Bathroom Shower/Tub: Engineer, manufacturing systems: Standard Home Adaptive Equipment: Straight cane;Shower chair with back Prior Function Level of Independence: Needs assistance Needs Assistance: Meal Prep;Light Housekeeping Meal Prep: Moderate Light Housekeeping: Maximal Able to Take Stairs?: Yes Driving: No Vocation: Retired Musician: No difficulties    Copywriter, advertising Arousal/Alertness: Awake/alert Behavior During Therapy: WFL for tasks assessed/performed Overall Cognitive Status: History of cognitive impairments - at baseline    Extremity/Trunk  Assessment Right Lower Extremity Assessment RLE ROM/Strength/Tone: WFL for tasks assessed RLE Sensation: WFL - Light  Touch Left Lower Extremity Assessment LLE ROM/Strength/Tone: WFL for tasks assessed LLE Sensation: WFL - Light Touch Trunk Assessment Trunk Assessment: Kyphotic   Balance Balance Balance Assessed: No  End of Session PT - End of Session Equipment Utilized During Treatment: Gait belt Activity Tolerance: Patient tolerated treatment well Patient left: in chair;with call bell/phone within reach;with family/visitor present Nurse Communication: Mobility status  GP     Sunny Schlein, [place] [phone] 10/03/2012, 10:51 AM

## 2012-10-03 NOTE — Procedures (Signed)
REFERRING PHYSICIAN:  Dr. Julian Reil.  INDICATION FOR STUDY:  A 79 year old man with a history of seizure disorder presenting with recurrent, partial, and generalized seizure activity.  DESCRIPTION:  This is a routine EEG recording performed during drowsiness and during sleep.  Predominant background activity during drowsiness consisted of mixed low-to-moderate amplitude, irregular delta and theta activity diffusely and symmetrically.  Occasional brief periods of relative alertness were characterized by 9 Hz alpha rhythm. Photic stimulation was not performed.  Hyperventilation was not performed.  During stage II sleep, symmetrical sleep spindles and K complexes were recorded.  No epileptiform discharges were recorded. There were no areas of disproportionate focal slowing.  INTERPRETATION:  This is a normal EEG recording during drowsiness and during sleep.     Noel Christmas, MD    ZO:XWRU D:  10/02/2012 14:14:04  T:  10/03/2012 02:49:33  Job #:  [id number]

## 2012-10-26 ENCOUNTER — Emergency Department (HOSPITAL_COMMUNITY): Payer: Medicare Other

## 2012-10-26 ENCOUNTER — Encounter (HOSPITAL_COMMUNITY): Payer: Self-pay | Admitting: Emergency Medicine

## 2012-10-26 ENCOUNTER — Emergency Department (HOSPITAL_COMMUNITY)
Admission: EM | Admit: 2012-10-26 | Discharge: 2012-10-27 | Disposition: A | Discharge status: Home / Self Care | Payer: Medicare Other | Attending: Emergency Medicine | Admitting: Emergency Medicine

## 2012-10-26 DIAGNOSIS — Z9889 Other specified postprocedural states: Secondary | ICD-10-CM | POA: Insufficient documentation

## 2012-10-26 DIAGNOSIS — I10 Essential (primary) hypertension: Secondary | ICD-10-CM | POA: Insufficient documentation

## 2012-10-26 DIAGNOSIS — E079 Disorder of thyroid, unspecified: Secondary | ICD-10-CM | POA: Insufficient documentation

## 2012-10-26 DIAGNOSIS — Z79899 Other long term (current) drug therapy: Secondary | ICD-10-CM | POA: Insufficient documentation

## 2012-10-26 DIAGNOSIS — K5900 Constipation, unspecified: Secondary | ICD-10-CM | POA: Insufficient documentation

## 2012-10-26 DIAGNOSIS — Z792 Long term (current) use of antibiotics: Secondary | ICD-10-CM | POA: Insufficient documentation

## 2012-10-26 DIAGNOSIS — R569 Unspecified convulsions: Secondary | ICD-10-CM | POA: Insufficient documentation

## 2012-10-26 DIAGNOSIS — Z7982 Long term (current) use of aspirin: Secondary | ICD-10-CM | POA: Insufficient documentation

## 2012-10-26 DIAGNOSIS — Z859 Personal history of malignant neoplasm, unspecified: Secondary | ICD-10-CM | POA: Insufficient documentation

## 2012-10-26 HISTORY — DX: Unspecified convulsions: R56.9

## 2012-10-26 NOTE — ED Provider Notes (Signed)
History  This chart was scribed for Joya Gaskins, MD by Bennett Scrape, ED Scribe. This patient was seen in room APA09/APA09 and the patient's care was started at 11:08 PM.  CSN: [id number]  Arrival date & time 10/26/12  2115   First MD Initiated Contact with Patient 10/26/12 2306      Chief Complaint  Patient presents with  . Abdominal Pain  . Constipation    Patient is a 79 y.o. male presenting with constipation. The history is provided by the patient. No language interpreter was used.  Constipation Severity:  No pain Time since last bowel movement:  2 days Timing:  Constant Progression:  Unchanged Chronicity:  New Context: narcotics   Stool description:  None produced Relieved by:  Nothing Ineffective treatments:  Enemas   HPI Comments: [name] is a 79 y.o. male who presents to the Emergency Department complaining of constant, unchanged constipation which has been occurring for the past two days. He reports that his last bm was two days ago.  Pt reports that he attempted a 10 glycerin suppository and fleet enema, but to no alleviation. Normally uses prune juice to avoid constipation, but he ran out of prune juice. He denies abdominal pain, emesis, fever, back pain, chest pain, SOB.  No nausea/vomiting reported  Past Medical History  Diagnosis Date  . HTN (hypertension)   . Thyroid disease   . Stroke     TIA  . Cancer   . Seizures     Past Surgical History  Procedure Laterality Date  . Hernia repair      x 2  . Prostate surgery    . Back surgery      cervical spine    Family History  Problem Relation Age of Onset  . Arthritis    . Cancer    . Asthma    . Diabetes      History  Substance Use Topics  . Smoking status: Never Smoker   . Smokeless tobacco: Not on file  . Alcohol Use: No      Review of Systems  A complete 10 system review of systems was obtained and all systems are negative except as noted in the HPI and PMH.    Allergies  Aspirin  Home Medications   Current Outpatient Rx  Name  Route  Sig  Dispense  Refill  . amLODipine (NORVASC) 10 MG tablet   Oral   Take 10 mg by mouth daily.         Marland Kitchen aspirin EC 81 MG tablet   Oral   Take 81 mg by mouth daily.         . clopidogrel (PLAVIX) 75 MG tablet   Oral   Take 1 tablet (75 mg total) by mouth daily with breakfast.   30 tablet   11   . gabapentin (NEURONTIN) 100 MG capsule   Oral   Take 100 mg by mouth at bedtime.         . hydrochlorothiazide (HYDRODIURIL) 25 MG tablet   Oral   Take 25 mg by mouth daily.         Marland Kitchen levETIRAcetam (KEPPRA) 750 MG tablet   Oral   Take 1 tablet (750 mg total) by mouth 2 (two) times daily.   60 tablet   12   . levothyroxine (SYNTHROID, LEVOTHROID) 112 MCG tablet   Oral   Take 112 mcg by mouth 2 (two) times daily.          Marland Kitchen  LORazepam (ATIVAN) 1 MG tablet   Oral   Take 1 tablet (1 mg total) by mouth at bedtime.   5 tablet   0   . memantine (NAMENDA) 10 MG tablet   Oral   Take 10 mg by mouth 2 (two) times daily.         . potassium chloride (K-DUR) 10 MEQ tablet   Oral   Take 1 tablet (10 mEq total) by mouth 2 (two) times daily.   20 tablet   0     Triage Vitals: BP 125/73  Pulse 77  Temp(Src) 98.3 F (36.8 C) (Oral)  Resp 18  SpO2 100%  Physical Exam  Nursing note and vitals reviewed.  CONSTITUTIONAL: Well developed/well nourished HEAD: Normocephalic/atraumatic EYES: EOMI/PERRL ENMT: Mucous membranes moist NECK: supple no meningeal signs SPINE:entire spine nontender CV: S1/S2 noted, no murmurs/rubs/gallops noted LUNGS: Lungs are clear to auscultation bilaterally, no apparent distress ABDOMEN: soft, nontender, no rebound or guarding, +BS.  No distention noted GU:no cva tenderness RECTAL: stool brown and stool impaction noted. Chaperone present.  NEURO: Pt is awake/alert, moves all extremitiesx4 EXTREMITIES: pulses normal, full ROM SKIN: warm, color  normal PSYCH: no abnormalities of mood noted  ED Course  Fecal disimpaction Date/Time: 10/26/2012 3:22 AM Performed by: Joya Gaskins Authorized by: Joya Gaskins Consent: Verbal consent obtained. Consent given by: patient Patient tolerance: Patient tolerated the procedure well with no immediate complications. Comments: Partial fecal disimpaction peformed Pt tolerated procedure well Chaperone present for entire procedure      DIAGNOSTIC STUDIES: Oxygen Saturation is 100% on room air, normal by my interpretation.    COORDINATION OF CARE:  11:11 PM- Informed pt that he will be given a rectal exam. X-ray of abdomen. Pt agreed to the plan.    Pt tolerated procedure well.  Xray negative for acute process and his abdomen is soft without focal tenderness I doubt bowel obstruction I did write Rx for miralax and discussed appropriate use and that if symptoms improve to stop taking miralax Pt stable for d/c   MDM  Nursing notes including past medical history and social history reviewed and considered in documentation xrays reviewed and considered       I personally performed the services described in this documentation, which was scribed in my presence. The recorded information has been reviewed and is accurate.     Joya Gaskins, MD 10/27/12 [phone]

## 2012-10-26 NOTE — ED Notes (Signed)
Patient complaining of lower abdominal pain and states "I can't use the bathroom. I've used 10 glycerin suppositories in about 2 hours and used a fleet enema with no relief."

## 2012-10-27 MED ORDER — POLYETHYLENE GLYCOL 3350 17 G PO PACK: 17.0000 g | PACK | Freq: Every day | ORAL | Status: DC

## 2012-10-27 NOTE — ED Notes (Signed)
Pt given ice water.

## 2012-11-19 ENCOUNTER — Ambulatory Visit (HOSPITAL_COMMUNITY)
Admission: RE | Admit: 2012-11-19 | Discharge: 2012-11-19 | Disposition: A | Discharge status: Home / Self Care | Payer: Medicare Other | Source: Ambulatory Visit | Attending: Family Medicine | Admitting: Family Medicine

## 2012-11-19 ENCOUNTER — Other Ambulatory Visit (HOSPITAL_COMMUNITY): Payer: Self-pay | Admitting: Family Medicine

## 2012-11-19 DIAGNOSIS — M546 Pain in thoracic spine: Secondary | ICD-10-CM | POA: Insufficient documentation

## 2012-11-19 DIAGNOSIS — M545 Low back pain, unspecified: Secondary | ICD-10-CM | POA: Insufficient documentation

## 2012-12-22 ENCOUNTER — Emergency Department (HOSPITAL_COMMUNITY): Payer: Medicare Other

## 2012-12-22 ENCOUNTER — Encounter (HOSPITAL_COMMUNITY): Payer: Self-pay | Admitting: *Deleted

## 2012-12-22 ENCOUNTER — Inpatient Hospital Stay (HOSPITAL_COMMUNITY)
Admission: EM | Admit: 2012-12-22 | Discharge: 2012-12-30 | DRG: 682 | Disposition: A | Discharge status: Home Health | Payer: Medicare Other | Attending: Family Medicine | Admitting: Family Medicine

## 2012-12-22 ENCOUNTER — Other Ambulatory Visit: Payer: Self-pay | Admitting: *Deleted

## 2012-12-22 DIAGNOSIS — I129 Hypertensive chronic kidney disease with stage 1 through stage 4 chronic kidney disease, or unspecified chronic kidney disease: Secondary | ICD-10-CM | POA: Diagnosis present

## 2012-12-22 DIAGNOSIS — E039 Hypothyroidism, unspecified: Secondary | ICD-10-CM | POA: Diagnosis present

## 2012-12-22 DIAGNOSIS — R7402 Elevation of levels of lactic acid dehydrogenase (LDH): Secondary | ICD-10-CM

## 2012-12-22 DIAGNOSIS — E876 Hypokalemia: Secondary | ICD-10-CM | POA: Diagnosis present

## 2012-12-22 DIAGNOSIS — N401 Enlarged prostate with lower urinary tract symptoms: Secondary | ICD-10-CM | POA: Diagnosis present

## 2012-12-22 DIAGNOSIS — Z87440 Personal history of urinary (tract) infections: Secondary | ICD-10-CM

## 2012-12-22 DIAGNOSIS — Z79899 Other long term (current) drug therapy: Secondary | ICD-10-CM

## 2012-12-22 DIAGNOSIS — B3781 Candidal esophagitis: Secondary | ICD-10-CM | POA: Diagnosis present

## 2012-12-22 DIAGNOSIS — F0390 Unspecified dementia without behavioral disturbance: Secondary | ICD-10-CM | POA: Diagnosis present

## 2012-12-22 DIAGNOSIS — K5900 Constipation, unspecified: Secondary | ICD-10-CM | POA: Diagnosis present

## 2012-12-22 DIAGNOSIS — M5137 Other intervertebral disc degeneration, lumbosacral region: Secondary | ICD-10-CM

## 2012-12-22 DIAGNOSIS — R1319 Other dysphagia: Secondary | ICD-10-CM

## 2012-12-22 DIAGNOSIS — R1310 Dysphagia, unspecified: Secondary | ICD-10-CM | POA: Diagnosis present

## 2012-12-22 DIAGNOSIS — IMO0002 Reserved for concepts with insufficient information to code with codable children: Secondary | ICD-10-CM

## 2012-12-22 DIAGNOSIS — G40909 Epilepsy, unspecified, not intractable, without status epilepticus: Secondary | ICD-10-CM | POA: Diagnosis present

## 2012-12-22 DIAGNOSIS — N1330 Unspecified hydronephrosis: Secondary | ICD-10-CM | POA: Diagnosis present

## 2012-12-22 DIAGNOSIS — Z8546 Personal history of malignant neoplasm of prostate: Secondary | ICD-10-CM

## 2012-12-22 DIAGNOSIS — R339 Retention of urine, unspecified: Secondary | ICD-10-CM | POA: Diagnosis present

## 2012-12-22 DIAGNOSIS — R569 Unspecified convulsions: Secondary | ICD-10-CM | POA: Diagnosis present

## 2012-12-22 DIAGNOSIS — K222 Esophageal obstruction: Secondary | ICD-10-CM | POA: Diagnosis present

## 2012-12-22 DIAGNOSIS — N320 Bladder-neck obstruction: Secondary | ICD-10-CM | POA: Diagnosis present

## 2012-12-22 DIAGNOSIS — K449 Diaphragmatic hernia without obstruction or gangrene: Secondary | ICD-10-CM | POA: Diagnosis present

## 2012-12-22 DIAGNOSIS — E860 Dehydration: Secondary | ICD-10-CM | POA: Diagnosis present

## 2012-12-22 DIAGNOSIS — R634 Abnormal weight loss: Secondary | ICD-10-CM

## 2012-12-22 DIAGNOSIS — M7989 Other specified soft tissue disorders: Secondary | ICD-10-CM

## 2012-12-22 DIAGNOSIS — R609 Edema, unspecified: Secondary | ICD-10-CM | POA: Diagnosis present

## 2012-12-22 DIAGNOSIS — D649 Anemia, unspecified: Secondary | ICD-10-CM | POA: Diagnosis present

## 2012-12-22 DIAGNOSIS — K219 Gastro-esophageal reflux disease without esophagitis: Secondary | ICD-10-CM | POA: Diagnosis present

## 2012-12-22 DIAGNOSIS — N189 Chronic kidney disease, unspecified: Secondary | ICD-10-CM | POA: Diagnosis present

## 2012-12-22 DIAGNOSIS — E41 Nutritional marasmus: Secondary | ICD-10-CM | POA: Diagnosis present

## 2012-12-22 DIAGNOSIS — K2980 Duodenitis without bleeding: Secondary | ICD-10-CM | POA: Diagnosis present

## 2012-12-22 DIAGNOSIS — K2960 Other gastritis without bleeding: Secondary | ICD-10-CM | POA: Diagnosis present

## 2012-12-22 DIAGNOSIS — N138 Other obstructive and reflux uropathy: Secondary | ICD-10-CM | POA: Diagnosis present

## 2012-12-22 DIAGNOSIS — R531 Weakness: Secondary | ICD-10-CM | POA: Diagnosis present

## 2012-12-22 DIAGNOSIS — I10 Essential (primary) hypertension: Secondary | ICD-10-CM | POA: Diagnosis present

## 2012-12-22 DIAGNOSIS — R7401 Elevation of levels of liver transaminase levels: Secondary | ICD-10-CM | POA: Diagnosis present

## 2012-12-22 DIAGNOSIS — N289 Disorder of kidney and ureter, unspecified: Secondary | ICD-10-CM

## 2012-12-22 DIAGNOSIS — N179 Acute kidney failure, unspecified: Principal | ICD-10-CM

## 2012-12-22 DIAGNOSIS — Z8673 Personal history of transient ischemic attack (TIA), and cerebral infarction without residual deficits: Secondary | ICD-10-CM

## 2012-12-22 HISTORY — DX: Unspecified dementia, unspecified severity, without behavioral disturbance, psychotic disturbance, mood disturbance, and anxiety: F03.90

## 2012-12-22 LAB — URINALYSIS, ROUTINE W REFLEX MICROSCOPIC
Bilirubin Urine: NEGATIVE
Ketones, ur: NEGATIVE mg/dL
Protein, ur: NEGATIVE mg/dL
Urobilinogen, UA: 0.2 mg/dL (ref 0.0–1.0)

## 2012-12-22 LAB — CBC WITH DIFFERENTIAL/PLATELET
Eosinophils Absolute: 0 10*3/uL (ref 0.0–0.7)
Eosinophils Relative: 0 % (ref 0–5)
Lymphs Abs: 1.7 10*3/uL (ref 0.7–4.0)
MCH: 28.9 pg (ref 26.0–34.0)
MCHC: 33.7 g/dL (ref 30.0–36.0)
MCV: 86 fL (ref 78.0–100.0)
Platelets: 166 10*3/uL (ref 150–400)
RBC: 3.63 MIL/uL — ABNORMAL LOW (ref 4.22–5.81)
RDW: 13.7 % (ref 11.5–15.5)

## 2012-12-22 LAB — COMPREHENSIVE METABOLIC PANEL
ALT: 69 U/L — ABNORMAL HIGH (ref 0–53)
Calcium: 10.5 mg/dL (ref 8.4–10.5)
GFR calc Af Amer: 30 mL/min — ABNORMAL LOW (ref >90)
Glucose, Bld: 112 mg/dL — ABNORMAL HIGH (ref 70–99)
Sodium: 138 mEq/L (ref 135–145)
Total Protein: 7.6 g/dL (ref 6.0–8.3)

## 2012-12-22 LAB — URINE MICROSCOPIC-ADD ON

## 2012-12-22 LAB — TROPONIN I: Troponin I: <0.3 ng/mL (ref <0.30)

## 2012-12-22 LAB — D-DIMER, QUANTITATIVE: D-Dimer, Quant: 3.47 ug/mL-FEU — ABNORMAL HIGH (ref 0.00–0.48)

## 2012-12-22 MED ORDER — SODIUM CHLORIDE 0.9 % IV BOLUS (SEPSIS)
500.0000 mL | Freq: Once | INTRAVENOUS | Status: AC
  Administered 2012-12-22: 500 mL via INTRAVENOUS

## 2012-12-22 MED ORDER — GABAPENTIN 100 MG PO CAPS: 100.0000 mg | ORAL_CAPSULE | Freq: Every day | ORAL | Status: DC

## 2012-12-22 MED ORDER — POTASSIUM CHLORIDE 20 MEQ/15ML (10%) PO LIQD
40.0000 meq | Freq: Once | ORAL | Status: AC
  Administered 2012-12-22: 40 meq via ORAL
  Filled 2012-12-22: qty 30

## 2012-12-22 MED ORDER — POTASSIUM CHLORIDE CRYS ER 20 MEQ PO TBCR: 40.0000 meq | EXTENDED_RELEASE_TABLET | Freq: Once | ORAL | Status: DC

## 2012-12-22 MED ORDER — ENOXAPARIN SODIUM 100 MG/ML ~~LOC~~ SOLN
1.0000 mg/kg | Freq: Once | SUBCUTANEOUS | Status: AC
  Administered 2012-12-22: 75 mg via SUBCUTANEOUS
  Filled 2012-12-22: qty 1

## 2012-12-22 NOTE — ED Notes (Signed)
First attempt at urethral cath for spec:. Returned bloody, tech unable to thread it in entirely, stopped and notified me and physician.

## 2012-12-22 NOTE — ED Notes (Signed)
Cath inserted by Dr. Patria Mane, returned bloody.

## 2012-12-22 NOTE — ED Notes (Addendum)
Pt seen at dr Osborne Casco office & advised to come the er. Family states weak & unable to walk now, bad cough, felt hot today, not eating since Friday. Thinks he may have a UTI also. Lower ext swelling & family states toes are dark.

## 2012-12-22 NOTE — ED Notes (Signed)
Pt sitting upright. Able to take potassium liquid/orange juice by straw w/o difficulty. Pt is not very conversant but does smile appropriately at family joking.

## 2012-12-22 NOTE — ED Provider Notes (Signed)
History  This chart was scribed for Lyanne Co, MD by Ardeen Jourdain, ED Scribe. This patient was seen in room APA02/APA02 and the patient's care was started at 1932.  CSN: [id number] Arrival date & time 12/22/12  1906   First MD Initiated Contact with Patient 12/22/12 1932     Chief Complaint  Patient presents with  . Weakness  . Cough  . Urinary Tract Infection  . Leg Swelling    Patient is a 79 y.o. male presenting with weakness and cough. The history is provided by the patient, the spouse and a relative. No language interpreter was used.  Weakness This is a new problem. The current episode started more than 1 week ago. The problem occurs constantly. The problem has been gradually worsening. Nothing aggravates the symptoms. Nothing relieves the symptoms. He has tried nothing for the symptoms. The treatment provided no relief.  Cough Severity:  Mild Onset quality:  Gradual Timing:  Constant Progression:  Worsening Chronicity:  New Smoker: no   Relieved by:  None tried Worsened by:  Nothing tried Ineffective treatments:  None tried   HPI Comments: [name] is a 79 y.o. male who presents to the Emergency Department complaining of gradual onset, gradually worsening, constant weakness with associated loss of appetite and lower extremity swelling. Pts wife states he has been unable to stand recently and has developed a bad cough. Pts wife states he went to see his PCP today who told him to come to the ED. Pts wife states he was diagnosed with a UTI 2 weeks ago, but was not taking the antibiotics properly. He finished his antibiotics today. She states she thinks he may have another UTI. She states he has not been eating much recently. She states he has trouble swallowing. Pt is currently not complaining of any pain.   Past Medical History  Diagnosis Date  . HTN (hypertension)   . Thyroid disease   . Stroke     TIA  . Cancer   . Seizures    Past Surgical History   Procedure Laterality Date  . Hernia repair      x 2  . Prostate surgery    . Back surgery      cervical spine   Family History  Problem Relation Age of Onset  . Arthritis    . Cancer    . Asthma    . Diabetes     History  Substance Use Topics  . Smoking status: Never Smoker   . Smokeless tobacco: Not on file  . Alcohol Use: No    Review of Systems  Respiratory: Positive for cough.   Neurological: Positive for weakness.  All other systems reviewed and are negative.    Allergies  Aspirin  Home Medications   Current Outpatient Rx  Name  Route  Sig  Dispense  Refill  . amLODipine (NORVASC) 10 MG tablet   Oral   Take 10 mg by mouth daily.         Marland Kitchen aspirin EC 81 MG tablet   Oral   Take 81 mg by mouth daily.         . clopidogrel (PLAVIX) 75 MG tablet   Oral   Take 1 tablet (75 mg total) by mouth daily with breakfast.   30 tablet   11   . gabapentin (NEURONTIN) 100 MG capsule   Oral   Take 1 capsule (100 mg total) by mouth at bedtime.   90 capsule  5   . hydrochlorothiazide (HYDRODIURIL) 25 MG tablet   Oral   Take 25 mg by mouth daily.         Marland Kitchen levETIRAcetam (KEPPRA) 750 MG tablet   Oral   Take 1 tablet (750 mg total) by mouth 2 (two) times daily.   60 tablet   12   . levothyroxine (SYNTHROID, LEVOTHROID) 112 MCG tablet   Oral   Take 112 mcg by mouth 2 (two) times daily.          Marland Kitchen LORazepam (ATIVAN) 1 MG tablet   Oral   Take 1 tablet (1 mg total) by mouth at bedtime.   5 tablet   0   . memantine (NAMENDA) 10 MG tablet   Oral   Take 10 mg by mouth 2 (two) times daily.         . polyethylene glycol (MIRALAX / GLYCOLAX) packet   Oral   Take 17 g by mouth daily.   10 each   0   . potassium chloride (K-DUR) 10 MEQ tablet   Oral   Take 1 tablet (10 mEq total) by mouth 2 (two) times daily.   20 tablet   0    Triage Vitals: BP 122/70  Pulse 89  Temp(Src) 99.4 F (37.4 C) (Oral)  Resp 16  Ht 5\' 10"  (1.778 m)  Wt 166 lb  (75.297 kg)  BMI 23.82 kg/m2  SpO2 99%  Physical Exam  Nursing note and vitals reviewed. Constitutional: He is oriented to person, place, and time. He appears well-developed and well-nourished.  HENT:  Head: Normocephalic and atraumatic.  Eyes: EOM are normal.  Neck: Normal range of motion.  Cardiovascular: Normal rate, regular rhythm, normal heart sounds and intact distal pulses.  Exam reveals no gallop and no friction rub.   No murmur heard. Pulmonary/Chest: Effort normal. No respiratory distress. He has rales.  No rhonchi. Rales at bases bilaterally    Abdominal: Soft. He exhibits no distension. There is no tenderness.  Genitourinary: Rectum normal.  Musculoskeletal: Normal range of motion.  Good bilateral lower extremity pulses. Bilaterally lower extremity 2+ swelling. Left greater than right.   Neurological: He is alert and oriented to person, place, and time.  Skin: Skin is warm and dry.  Psychiatric: He has a normal mood and affect. Judgment normal.    ED Course  BLADDER CATHETERIZATION Date/Time: 12/22/2012 8:19 PM Performed by: Lyanne Co Authorized by: Lyanne Co Consent: Verbal consent obtained. Risks and benefits: risks, benefits and alternatives were discussed Consent given by: patient and spouse Patient understanding: patient states understanding of the procedure being performed Required items: required blood products, implants, devices, and special equipment available Patient identity confirmed: verbally with patient Time out: Immediately prior to procedure a "time out" was called to verify the correct patient, procedure, equipment, support staff and site/side marked as required. Indications: urinary retention and urine specimen collection Local anesthesia used: no Patient sedated: no Preparation: Patient was prepped and draped in the usual sterile fashion. Catheter type: coude Catheter size: 16 Fr Complicated insertion: no Number of attempts:  1 Urine characteristics: bloody and mildly cloudy Patient tolerance: Patient tolerated the procedure well with no immediate complications.     Angiocath insertion Performed by: Lyanne Co Consent: Verbal consent obtained. Risks and benefits: risks, benefits and alternatives were discussed Time out: Immediately prior to procedure a "time out" was called to verify the correct patient, procedure, equipment, support staff and site/side marked as required. Preparation: Patient was  prepped and draped in the usual sterile fashion. Vein Location: left basilic Ultrasound Guided Gauge: 20 Normal blood return and flush without difficulty Patient tolerance: Patient tolerated the procedure well with no immediate complications.    Date: 12/22/2012  Rate: 83  Rhythm: normal sinus rhythm  QRS Axis: normal  Intervals: normal  ST/T Wave abnormalities: normal  Conduction Disutrbances: RBBB  Narrative Interpretation:   Old EKG Reviewed: No significant changes noted     DIAGNOSTIC STUDIES: Oxygen Saturation is 99% on room air, normal by my interpretation.    COORDINATION OF CARE:  7:42 PM-Discussed treatment plan which includes UA, CBC, CMP, Troponin-I, D-dimer, CXR and an EKG with pt at bedside and pt agreed to plan.   Labs Reviewed  URINALYSIS, ROUTINE W REFLEX MICROSCOPIC - Abnormal; Notable for the following:    Color, Urine AMBER (*)    Specific Gravity, Urine >1.030 (*)    Hgb urine dipstick LARGE (*)    All other components within normal limits  CBC WITH DIFFERENTIAL - Abnormal; Notable for the following:    RBC 3.63 (*)    Hemoglobin 10.5 (*)    HCT 31.2 (*)    Monocytes Absolute 1.1 (*)    All other components within normal limits  COMPREHENSIVE METABOLIC PANEL - Abnormal; Notable for the following:    Potassium 3.0 (*)    Glucose, Bld 112 (*)    BUN 32 (*)    Creatinine, Ser 2.12 (*)    AST 185 (*)    ALT 69 (*)    Total Bilirubin 1.5 (*)    GFR calc non Af Amer 26  (*)    GFR calc Af Amer 30 (*)    All other components within normal limits  D-DIMER, QUANTITATIVE - Abnormal; Notable for the following:    D-Dimer, Quant 3.47 (*)    All other components within normal limits  TROPONIN I  URINE MICROSCOPIC-ADD ON   Dg Chest 2 View  12/22/2012   *RADIOLOGY REPORT*  Clinical Data: Weakness and cough.  Urinary tract infection.  CHEST - 2 VIEW  Comparison: Two-view chest 09/29/2012.  Findings: The heart size is exaggerated by low lung volumes.  The lungs are clear.  The visualized soft tissues and bony thorax are unremarkable.  IMPRESSION:  1.  Low lung volumes. 2.  No acute cardiopulmonary disease.   Original Report Authenticated By: Marin Roberts, M.D.   I personally reviewed the imaging tests through PACS system I reviewed available ER/hospitalization records through the EMR   1. Renal insufficiency   2. Swelling of left lower extremity   3. Urinary retention   4. Hypokalemia     MDM  Patient overall looks volume depleted.  Acute urinary retention was relieved with passage of a coud catheter by myself.  First thing 900 cc of dark colored urine returned after catheter placement.  Patient does have bilateral lower extremity swelling left greater than right.  This is concerning for deep vein thrombosis.  No ultrasound available tonight.  D-dimer elevated at 3.47.  1 mg per kilogram dose of Lovenox was given.  The patient will need an ultrasound in the morning.  The patient is not a candidate for CT scanning of his chest at this time to evaluate for pulmonary embolism given his elevated creatinine.  At this time my suspicion for PE is low.  Potassium has been replaced.  The patient be admitted the hospital.   I personally performed the services described in this documentation, which  was scribed in my presence. The recorded information has been reviewed and is accurate.      Lyanne Co, MD 12/22/12 [phone]

## 2012-12-22 NOTE — ED Notes (Signed)
Pt's pastor came in room, pt perked up, smiled and told me that he had been his pastor for 38 years.

## 2012-12-22 NOTE — H&P (Signed)
[facility] History and Physical  [name] JWJ:[id number] DOB: [birth year] DOA: 12/22/2012   PCP: Milana Obey, MD  Specialists: Patient is followed by a urologist at [facility] for prostate cancer. He's also followed by Dr. Gerilyn Pilgrim for history of TIAs and seizures  Chief Complaint: Generalized weakness  HPI: [name] is a 79 y.o. male with a past medical history of hypertension, stroke, seizure disorder, prostate cancer, who according to the family has been deteriorating ever since his hospitalization back in April. He was admitted for UTI at that time and then subsequently, was again admitted for a seizure. According to the wife patient has not been able to walk much for the last few weeks because of generalized weakness. He does have an old history of right-sided weakness, but there has been no change in that recently. He has had a poor appetite. He's had some difficulty swallowing for the last few weeks. He went to see a local doctor on Wednesday, and he was diagnosed with a urinary tract infection and was prescribed ciprofloxacin for 7 days. He has completed the course of that antibiotic. Patient denies any abdominal pain. No blood in the stools. No black colored stools. Denies any chest pain, shortness of breath, nausea, vomiting, or acid reflux. Denies any back pains. Has been constipated and that this has been relieved with the MiraLAX. He is having some loose soft stools now. Wife reports a colonoscopy within the last 2-3 years, which was unremarkable. Denies any headaches. His wife states that he has lost weight, approximately 24 pounds, over the last 3-4 months. Denies any recent falls. Denies any history of blood in the urine. In the ED, he was found to have urinary retention and a Foley catheter was placed and was drained.  Home Medications: Prior to Admission medications   Medication Sig Start Date End Date Taking? Authorizing Provider  aspirin EC 81 MG  tablet Take 81 mg by mouth daily.   Yes Historical Provider, MD  ciprofloxacin (CIPRO) 500 MG tablet Take 500 mg by mouth 2 (two) times daily. 12/15/12 12/25/12 Yes Historical Provider, MD  gabapentin (NEURONTIN) 100 MG capsule Take 1 capsule (100 mg total) by mouth at bedtime. 12/22/12  Yes Vickki Hearing, MD  haloperidol (HALDOL) 1 MG tablet Take 1 mg by mouth 3 (three) times daily.   Yes Historical Provider, MD  hydrochlorothiazide (HYDRODIURIL) 25 MG tablet Take 25 mg by mouth daily.   Yes Historical Provider, MD  HYDROcodone-acetaminophen (NORCO/VICODIN) 5-325 MG per tablet Take 1-2 tablets by mouth 2 (two) times daily.   Yes Historical Provider, MD  levETIRAcetam (KEPPRA) 750 MG tablet Take 1 tablet (750 mg total) by mouth 2 (two) times daily. 10/03/12  Yes Rhetta Mura, MD  levothyroxine (SYNTHROID, LEVOTHROID) 112 MCG tablet Take 112 mcg by mouth 2 (two) times daily.    Yes Historical Provider, MD  LORazepam (ATIVAN) 2 MG tablet Take 2 mg by mouth 2 (two) times daily.   Yes Historical Provider, MD  memantine (NAMENDA) 10 MG tablet Take 10 mg by mouth 2 (two) times daily.   Yes Historical Provider, MD  polyethylene glycol (MIRALAX / GLYCOLAX) packet Take 17 g by mouth daily. 10/27/12  Yes Joya Gaskins, MD  potassium chloride (K-DUR) 10 MEQ tablet Take 1 tablet (10 mEq total) by mouth 2 (two) times daily. 10/01/12  Yes Milana Obey, MD    Allergies:  Allergies  Allergen Reactions  . Aspirin     REACTION: "elevated pulse". Cannot  take any strength higher than 81mg     Past Medical History: Past Medical History  Diagnosis Date  . HTN (hypertension)   . Thyroid disease   . Stroke     TIA  . Cancer   . Seizures     Past Surgical History  Procedure Laterality Date  . Hernia repair      x 2  . Prostate surgery    . Back surgery      cervical spine    Social History:  reports that he has never smoked. He does not have any smokeless tobacco history on file. He  reports that he does not drink alcohol or use illicit drugs.  Living Situation: Lives with his wife Activity Level: Apparently, patient was quite independent and quite active till April of this year. He was driving his car. He has had a slow decline since then.   Family History:  Family History  Problem Relation Age of Onset  . Arthritis    . Cancer    . Asthma    . Diabetes       Review of Systems - patient doesn't answer many questions. So ROS limited except as noted in HPI.  Physical Examination  Filed Vitals:   12/22/12 1921  BP: 122/70  Pulse: 89  Temp: 99.4 F (37.4 C)  TempSrc: Oral  Resp: 16  Height: 5\' 10"  (1.778 m)  Weight: 75.297 kg (166 lb)  SpO2: 99%    General appearance: alert, cooperative, appears stated age and no distress Head: Normocephalic, without obvious abnormality, atraumatic Eyes: conjunctivae/corneas clear. PERRL, EOM's intact. Throat: Dry mucous membranes. No obvious lesions are noted Neck: no adenopathy, no carotid bruit, no JVD, supple, symmetrical, trachea midline and thyroid not enlarged, symmetric, no tenderness/mass/nodules Resp: clear to auscultation bilaterally Cardio: regular rate and rhythm, S1, S2 normal, no murmur, click, rub or gallop GI: soft, non-tender; bowel sounds normal; no masses,  no organomegaly Extremities: edema bilateral pedal edema 2+. Left slightly larger. no erythema. No calf tenderness. Pulses: 2+ and symmetric Skin: Skin color, texture, turgor normal. No rashes or lesions Lymph nodes: Cervical, supraclavicular, and axillary nodes normal. Neurologic: He is alert. No cranial nerve deficits. Motor strength is equal, bilateral upper and lower extremity. He was able to lift his legs off the bed equally bilaterally. Reflexes were normal and equal bilaterally. Gait was not assessed. Cerebellar signs normal.  Laboratory Data: Results for orders placed during the hospital encounter of 12/22/12 (from the past 48 hour(s))   URINALYSIS, ROUTINE W REFLEX MICROSCOPIC     Status: Abnormal   Collection Time    12/22/12  8:20 PM      Result Value Range   Color, Urine AMBER (*) YELLOW   Comment: BIOCHEMICALS MAY BE AFFECTED BY COLOR   APPearance CLEAR  CLEAR   Specific Gravity, Urine >1.030 (*) 1.005 - 1.030   pH 5.5  5.0 - 8.0   Glucose, UA NEGATIVE  NEGATIVE mg/dL   Hgb urine dipstick LARGE (*) NEGATIVE   Bilirubin Urine NEGATIVE  NEGATIVE   Ketones, ur NEGATIVE  NEGATIVE mg/dL   Protein, ur NEGATIVE  NEGATIVE mg/dL   Urobilinogen, UA 0.2  0.0 - 1.0 mg/dL   Nitrite NEGATIVE  NEGATIVE   Leukocytes, UA NEGATIVE  NEGATIVE  URINE MICROSCOPIC-ADD ON     Status: None   Collection Time    12/22/12  8:20 PM      Result Value Range   Squamous Epithelial / LPF RARE  RARE  WBC, UA 0-2  <3 WBC/hpf   RBC / HPF TOO NUMEROUS TO COUNT  <3 RBC/hpf   Bacteria, UA RARE  RARE  CBC WITH DIFFERENTIAL     Status: Abnormal   Collection Time    12/22/12  9:45 PM      Result Value Range   WBC 10.2  4.0 - 10.5 K/uL   RBC 3.63 (*) 4.22 - 5.81 MIL/uL   Hemoglobin 10.5 (*) 13.0 - 17.0 g/dL   HCT 11.9 (*) 14.7 - 82.9 %   MCV 86.0  78.0 - 100.0 fL   MCH 28.9  26.0 - 34.0 pg   MCHC 33.7  30.0 - 36.0 g/dL   RDW 56.2  13.0 - 86.5 %   Platelets 166  150 - 400 K/uL   Neutrophils Relative % 72  43 - 77 %   Neutro Abs 7.3  1.7 - 7.7 K/uL   Lymphocytes Relative 17  12 - 46 %   Lymphs Abs 1.7  0.7 - 4.0 K/uL   Monocytes Relative 11  3 - 12 %   Monocytes Absolute 1.1 (*) 0.1 - 1.0 K/uL   Eosinophils Relative 0  0 - 5 %   Eosinophils Absolute 0.0  0.0 - 0.7 K/uL   Basophils Relative 0  0 - 1 %   Basophils Absolute 0.0  0.0 - 0.1 K/uL  COMPREHENSIVE METABOLIC PANEL     Status: Abnormal   Collection Time    12/22/12  9:45 PM      Result Value Range   Sodium 138  135 - 145 mEq/L   Potassium 3.0 (*) 3.5 - 5.1 mEq/L   Chloride 96  96 - 112 mEq/L   CO2 30  19 - 32 mEq/L   Glucose, Bld 112 (*) 70 - 99 mg/dL   BUN 32 (*) 6 - 23  mg/dL   Creatinine, Ser 7.84 (*) 0.50 - 1.35 mg/dL   Calcium 69.6  8.4 - 29.5 mg/dL   Total Protein 7.6  6.0 - 8.3 g/dL   Albumin 4.1  3.5 - 5.2 g/dL   AST 284 (*) 0 - 37 U/L   ALT 69 (*) 0 - 53 U/L   Alkaline Phosphatase 73  39 - 117 U/L   Total Bilirubin 1.5 (*) 0.3 - 1.2 mg/dL   GFR calc non Af Amer 26 (*) >90 mL/min   GFR calc Af Amer 30 (*) >90 mL/min   Comment:            The eGFR has been calculated     using the CKD EPI equation.     This calculation has not been     validated in all clinical     situations.     eGFR's persistently     <90 mL/min signify     possible Chronic Kidney Disease.  TROPONIN I     Status: None   Collection Time    12/22/12  9:45 PM      Result Value Range   Troponin I <0.30  <0.30 ng/mL   Comment:            Due to the release kinetics of cTnI,     a negative result within the first hours     of the onset of symptoms does not rule out     myocardial infarction with certainty.     If myocardial infarction is still suspected,     repeat the test at appropriate intervals.  D-DIMER,  QUANTITATIVE     Status: Abnormal   Collection Time    12/22/12  9:45 PM      Result Value Range   D-Dimer, Quant 3.47 (*) 0.00 - 0.48 ug/mL-FEU   Comment:            AT THE INHOUSE ESTABLISHED CUTOFF     VALUE OF 0.48 ug/mL FEU,     THIS ASSAY HAS BEEN DOCUMENTED     IN THE LITERATURE TO HAVE     A SENSITIVITY AND NEGATIVE     PREDICTIVE VALUE OF AT LEAST     98 TO 99%.  THE TEST RESULT     SHOULD BE CORRELATED WITH     AN ASSESSMENT OF THE CLINICAL     PROBABILITY OF DVT / VTE.    Radiology Reports: Dg Chest 2 View  12/22/2012   *RADIOLOGY REPORT*  Clinical Data: Weakness and cough.  Urinary tract infection.  CHEST - 2 VIEW  Comparison: Two-view chest 09/29/2012.  Findings: The heart size is exaggerated by low lung volumes.  The lungs are clear.  The visualized soft tissues and bony thorax are unremarkable.  IMPRESSION:  1.  Low lung volumes. 2.  No acute  cardiopulmonary disease.   Original Report Authenticated By: Marin Roberts, M.D.    Electrocardiogram: EKG shows normal sinus rhythm at 83 beats per minute. Left axis deviation. Right bundle branch block. QT interval is prolonged. No definite Q waves. No concerning ST changes. Nonspecific T-wave changes are noted.  Problem List  Principal Problem:   ARF (acute renal failure) Active Problems:   HYPOTHYROIDISM   HYPERTENSION   SEIZURE DISORDER   PROSTATE CANCER, HX OF   Generalized weakness   Weight loss   Urinary retention   Leg swelling   Transaminitis   Hypokalemia   Assessment:  This is 79 year old, [name] who presents with generalized weakness and is found to have acute renal failure and urinary retention. His UTI seems to have cleared up. He has had significant weight loss in the last few months. He has a prolonged QT, probably from Haldol and use of Cipro recently. He also has lower extremity edema, which is concerning for DVT, considering his relative immobilization.  Plan: #1 acute renal failure: Most likely due to poor oral intake. Also contributed by the urinary retention. He'll be given gentle IV hydration and his renal function will be repeated tomorrow.  #2 hypokalemia: This has been repleted by the ED physician. We will repeat electrolytes tomorrow.  #3 pedal edema: D-dimer is elevated. He's been given one dose of Lovenox. He will get a venous ultrasound tomorrow.  #4 prolonged QT interval: We will discontinue his Haldol. Monitor him on telemetry. Repeat his EKG in the morning.  #5 generalized weakness and weight loss: This is very concerning. It is unclear if he has any occult process such as malignancy. We will start off with ultrasound of the abdomen. We'll get a PSA level. If his renal function improves, PCP should consider getting a CT abdomen with contrast. If it doesn't improve then CT abdomen without contrast should be considered. Swallow  evaluation will be obtained by speech therapy. PT and OT will be asked to see the patient. Examination is not remarkable for any focal deficits. He had MRI of his brain back in May, which revealed findings of old ischemia. There is no compelling reason to repeat brain imaging at this time.  #6 history of seizure disorder: Continue with Keppra. Continue  with a low dose of Ativan.  #7 urinary retention: [facility] consult urology. Continue Foley for now.  #8 transaminitis: He has had this in the past. We'll get ultrasound of the abdomen to further evaluate.  #9 hypothyroidism: Continue with Synthroid. Check TSH and free T4.   DVT Prophylaxis: He's been given one dose of therapeutic Lovenox. Further decision be considered tomorrow after venous ultrasound. Code Status: Full code Family Communication: Discussed with the patient and his family  Disposition Plan: Admit to telemetry   Further management decisions will depend on results of further testing and patient's response to treatment.  [facility]  [facility] Pager [phone]  If 7PM-7AM, please contact night-coverage [email or web address] Password [facility]  12/22/2012, 11:24 PM

## 2012-12-23 ENCOUNTER — Encounter (HOSPITAL_COMMUNITY): Payer: Self-pay

## 2012-12-23 ENCOUNTER — Inpatient Hospital Stay (HOSPITAL_COMMUNITY): Payer: Medicare Other

## 2012-12-23 LAB — COMPREHENSIVE METABOLIC PANEL
ALT: 62 U/L — ABNORMAL HIGH (ref 0–53)
AST: 175 U/L — ABNORMAL HIGH (ref 0–37)
Albumin: 3.5 g/dL (ref 3.5–5.2)
Alkaline Phosphatase: 65 U/L (ref 39–117)
CO2: 28 mEq/L (ref 19–32)
Chloride: 103 mEq/L (ref 96–112)
GFR calc non Af Amer: 33 mL/min — ABNORMAL LOW (ref >90)
Potassium: 2.9 mEq/L — ABNORMAL LOW (ref 3.5–5.1)
Sodium: 141 mEq/L (ref 135–145)
Total Bilirubin: 1.3 mg/dL — ABNORMAL HIGH (ref 0.3–1.2)

## 2012-12-23 LAB — PSA: PSA: 2.6 ng/mL (ref <=4.00)

## 2012-12-23 LAB — CBC
MCH: 28.7 pg (ref 26.0–34.0)
MCHC: 33.5 g/dL (ref 30.0–36.0)
MCV: 85.7 fL (ref 78.0–100.0)
Platelets: 152 10*3/uL (ref 150–400)

## 2012-12-23 LAB — TSH: TSH: 4.187 u[IU]/mL (ref 0.350–4.500)

## 2012-12-23 LAB — T4, FREE: Free T4: 1.28 ng/dL (ref 0.80–1.80)

## 2012-12-23 MED ORDER — ACETAMINOPHEN 325 MG PO TABS
650.0000 mg | ORAL_TABLET | Freq: Four times a day (QID) | ORAL | Status: DC | PRN
  Filled 2012-12-23: qty 2

## 2012-12-23 MED ORDER — ENSURE COMPLETE PO LIQD
237.0000 mL | Freq: Two times a day (BID) | ORAL | Status: DC
  Administered 2012-12-24 – 2012-12-26 (×4): 237 mL via ORAL

## 2012-12-23 MED ORDER — ASPIRIN EC 81 MG PO TBEC
81.0000 mg | DELAYED_RELEASE_TABLET | Freq: Every day | ORAL | Status: DC
  Administered 2012-12-23 – 2012-12-26 (×4): 81 mg via ORAL
  Filled 2012-12-23 (×5): qty 1

## 2012-12-23 MED ORDER — ONDANSETRON HCL 4 MG/2ML IJ SOLN: 4.0000 mg | Freq: Four times a day (QID) | INTRAMUSCULAR | Status: DC | PRN

## 2012-12-23 MED ORDER — SODIUM CHLORIDE 0.9 % IV SOLN
INTRAVENOUS | Status: DC
  Administered 2012-12-24 – 2012-12-29 (×6): via INTRAVENOUS

## 2012-12-23 MED ORDER — LORAZEPAM 1 MG PO TABS
0.5000 mg | ORAL_TABLET | Freq: Two times a day (BID) | ORAL | Status: DC
  Administered 2012-12-23 – 2012-12-30 (×16): 0.5 mg via ORAL
  Filled 2012-12-23 (×15): qty 1
  Filled 2012-12-23: qty 2
  Filled 2012-12-23 (×2): qty 1

## 2012-12-23 MED ORDER — ACETAMINOPHEN 650 MG RE SUPP: 650.0000 mg | Freq: Four times a day (QID) | RECTAL | Status: DC | PRN

## 2012-12-23 MED ORDER — SODIUM CHLORIDE 0.9 % IV SOLN
INTRAVENOUS | Status: AC
  Administered 2012-12-23: 05:00:00 via INTRAVENOUS

## 2012-12-23 MED ORDER — ONDANSETRON HCL 4 MG PO TABS: 4.0000 mg | ORAL_TABLET | Freq: Four times a day (QID) | ORAL | Status: DC | PRN

## 2012-12-23 MED ORDER — MEMANTINE HCL 10 MG PO TABS
10.0000 mg | ORAL_TABLET | Freq: Two times a day (BID) | ORAL | Status: DC
  Administered 2012-12-23 – 2012-12-30 (×16): 10 mg via ORAL
  Filled 2012-12-23 (×18): qty 1

## 2012-12-23 MED ORDER — LEVETIRACETAM 500 MG PO TABS
750.0000 mg | ORAL_TABLET | Freq: Two times a day (BID) | ORAL | Status: DC
  Administered 2012-12-23 – 2012-12-30 (×16): 750 mg via ORAL
  Filled 2012-12-23 (×16): qty 2
  Filled 2012-12-23: qty 1
  Filled 2012-12-23: qty 2

## 2012-12-23 MED ORDER — SODIUM CHLORIDE 0.9 % IJ SOLN
3.0000 mL | Freq: Two times a day (BID) | INTRAMUSCULAR | Status: DC
  Administered 2012-12-23 – 2012-12-29 (×7): 3 mL via INTRAVENOUS

## 2012-12-23 MED ORDER — LEVOTHYROXINE SODIUM 112 MCG PO TABS
112.0000 ug | ORAL_TABLET | Freq: Two times a day (BID) | ORAL | Status: DC
  Administered 2012-12-23 – 2012-12-30 (×16): 112 ug via ORAL
  Filled 2012-12-23 (×18): qty 1

## 2012-12-23 MED ORDER — GABAPENTIN 100 MG PO CAPS
100.0000 mg | ORAL_CAPSULE | Freq: Every day | ORAL | Status: DC
  Administered 2012-12-23 – 2012-12-29 (×8): 100 mg via ORAL
  Filled 2012-12-23 (×8): qty 1

## 2012-12-23 NOTE — Evaluation (Signed)
Physical Therapy Evaluation Patient Details Name: [name] MRN: [id number] DOB: 06/25/1920 Today's Date: 12/23/2012 Time: 1310-1340 PT Time Calculation (min): 30 min  PT Assessment / Plan / Recommendation History of Present Illness   79 yo male admitted to [facility] with renal failure.  PT family states pt was ambulatory ten days ago but has been declining in functional ability  Clinical Impression  Decreased strength and functional mobility needing skilled care to progress pt to previous functional level.    PT Assessment  Patient needs continued PT services    Follow Up Recommendations  SNF    Does the patient have the potential to tolerate intense rehabilitation    no  Barriers to Discharge  needs max assist      Equipment Recommendations  None recommended by PT    Recommendations for Other Services   OT  Frequency Min 3X/week    Precautions / Restrictions Precautions Precautions: Fall Restrictions Weight Bearing Restrictions: No   Pertinent Vitals/Pain None verbalized      Mobility  Bed Mobility Bed Mobility: Rolling Left;Supine to Sit;Sit to Supine Rolling Left: 2: Max assist Supine to Sit: 2: Max assist Sit to Supine: 2: Max assist Details for Bed Mobility Assistance: Pt able to sit without support for two miinutes prior to fatigue Transfers Transfers: Not assessed    Exercises General Exercises - Lower Extremity Ankle Circles/Pumps: AAROM;5 reps Long Arc Quad: AAROM;10 reps Heel Slides: AAROM;5 reps Hip ABduction/ADduction: AAROM;5 reps   PT Diagnosis: Generalized weakness;Difficulty walking  PT Problem List: Decreased strength;Decreased range of motion;Decreased activity tolerance PT Treatment Interventions: Therapeutic activities;Therapeutic exercise     PT Goals(Current goals can be found in the care plan section)    Visit Information  Last PT Received On: 12/23/12       Prior Functioning  Home Living Family/patient expects to be  discharged to:: Private residence Living Arrangements: Spouse/significant other Available Help at Discharge: Family;Available 24 hours/day Type of Home: House Home Access: Stairs to enter [employer] of Steps: 2 Entrance Stairs-Rails: None Home Layout: One level Home Equipment: Walker - 2 wheels;Cane - single point;Shower seat Prior Function Level of Independence: Needs assistance Communication Communication: No difficulties    Cognition  Cognition Arousal/Alertness: Lethargic Behavior During Therapy: Flat affect Overall Cognitive Status: Within Functional Limits for tasks assessed    Extremity/Trunk Assessment Lower Extremity Assessment Lower Extremity Assessment: Generalized weakness (genneral strength for RT LE 2/5; Lt 2-/5)   Balance    End of Session PT - End of Session Activity Tolerance: Patient limited by fatigue;Patient limited by lethargy Patient left: in bed;with call bell/phone within reach;with family/visitor present  GP     [name],CINDY 12/23/2012, 1:51 PM

## 2012-12-23 NOTE — Progress Notes (Signed)
Utilization Review Complete  

## 2012-12-23 NOTE — Care Management Note (Addendum)
    Page 1 of 1   12/30/2012     11:25:48 AM   CARE MANAGEMENT NOTE 12/30/2012  Patient:  [name], [name]   Account Number:  [id number]  Date Initiated:  12/23/2012  Documentation initiated by:  Rosemary Holms  Subjective/Objective Assessment:   Pt admitted from home where he lives with his wife. Previously had [facility] and would like to resume with [facility] with aide added to RN and PT. Has hospital bed, wc, walker, elevated toilet. No additional DME identified.     Action/Plan:   Anticipated DC Date:  12/30/2012   Anticipated DC Plan:  HOME W HOME HEALTH SERVICES      DC Planning Services  CM consult      Choice offered to / List presented to:          [facility] arranged  HH-1 RN  HH-10 DISEASE MANAGEMENT  HH-2 PT  HH-4 NURSE'S AIDE      HH agency  [facility]   Status of service:  Completed, signed off Medicare Important Message given?  YES (If response is "NO", the following Medicare IM given date fields will be blank) Date Medicare IM given:  12/30/2012 Date Additional Medicare IM given:    Discharge Disposition:  HOME W HOME HEALTH SERVICES  Per UR Regulation:    If discussed at Long Length of Stay Meetings, dates discussed:    Comments:  12/26/12 Verdell Dykman Leanord Hawking RN BSN CM Wife adamant about pt going back home with [facility] services. No to SNF  12/23/12 Kenadi Miltner Leanord Hawking RN BSN CM

## 2012-12-23 NOTE — Progress Notes (Signed)
INITIAL NUTRITION ASSESSMENT  DOCUMENTATION CODES Per approved criteria  -Severe malnutrition in the context of acute illness   INTERVENTION:  Ensure Complete po BID, each supplement provides 350 kcal and 13 grams of protein.  NUTRITION DIAGNOSIS: Malnutriton related to inadequate oral intake as evidenced by pt diet hx review and 24#13% wt loss x 90 days.   Goal: Pt to meet >/= 90% of their estimated nutrition needs   Monitor:  Po intake, labs and wt trends  Reason for Assessment: Malnutrition Screen Score = 4  79 y.o. male  Admitting Dx: ARF (acute renal failure), urinary retention  ASSESSMENT: Pt from home. Lives with spouse. Home diet is Low Sodium. Poor appetite with decreased food and beverage intake, swallow difficulty, increased weakness, edema (lower extremities). Hx of recent UTI. His wife says that he has been eating poorly for > 1 month and illustrates his usual intake amount by his lunch tray (25-50% consumed).   Pt meets criteria for severe malnutrition in the context of acute illness given his severe unplanned wt loss 24#, 13% x 90 days. Inadequate energy intake </=50% for >/= 5 days.  Height: Ht Readings from Last 1 Encounters:  12/22/12 5\' 10"  (1.778 m)    Weight: Wt Readings from Last 1 Encounters:  12/22/12 166 lb (75.297 kg)    Ideal Body Weight: 166# (75.4 kg)   % Ideal Body Weight: 100%  Wt Readings from Last 10 Encounters:  12/22/12 166 lb (75.297 kg)  10/03/12 198 lb 10.2 oz (90.1 kg)  09/29/12 191 lb 6.4 oz (86.818 kg)  09/17/12 195 lb (88.451 kg)  03/05/12 197 lb 1.5 oz (89.4 kg)  10/16/11 200 lb (90.719 kg)  09/25/11 200 lb (90.719 kg)  08/24/08 204 lb 4 oz (92.647 kg)  06/25/07 209 lb (94.802 kg)    Usual Body Weight: 190#  % Usual Body Weight: 87%  BMI:  Body mass index is 23.82 kg/(m^2).normal range  Estimated Nutritional Needs: Kcal: 2956-2130 Protein: 80-90 gr Fluid: 2200 ml/day  Skin: No issues noted  Diet Order:  Dysphagia 3 thin liquids  EDUCATION NEEDS: -Education needs addressed   Intake/Output Summary (Last 24 hours) at 12/23/12 1334 Last data filed at 12/23/12 0559  Gross per 24 hour  Intake      0 ml  Output   1000 ml  Net  -1000 ml    Last BM:  PTA  Labs:   Recent Labs Lab 12/22/12 2145 12/23/12 0541  NA 138 141  K 3.0* 2.9*  CL 96 103  CO2 30 28  BUN 32* 30*  CREATININE 2.12* 1.75*  CALCIUM 10.5 9.7  GLUCOSE 112* 103*    CBG (last 3)  No results found for this basename: GLUCAP,  in the last 72 hours  Scheduled Meds: . aspirin EC  81 mg Oral Daily  . gabapentin  100 mg Oral QHS  . levETIRAcetam  750 mg Oral BID  . levothyroxine  112 mcg Oral BID  . LORazepam  0.5 mg Oral BID  . memantine  10 mg Oral BID  . sodium chloride  3 mL Intravenous Q12H    Continuous Infusions: . sodium chloride 75 mL/hr at 12/23/12 1101    Past Medical History  Diagnosis Date  . HTN (hypertension)   . Thyroid disease   . Stroke     TIA  . Cancer   . Seizures     Past Surgical History  Procedure Laterality Date  . Hernia repair  x 2  . Prostate surgery    . Back surgery      cervical spine    Royann Shivers MS,RD,LDN,CSG Office: #[phone] Pager: [phone]

## 2012-12-23 NOTE — Evaluation (Signed)
Clinical/Bedside Swallow Evaluation  Patient Details  Name: [name] MRN: [id number] Date of Birth: 11/25/1920  Today's Date: 12/23/2012 Time: 0865-7846 SLP Time Calculation (min): 40 min  Past Medical History:  Past Medical History  Diagnosis Date  . HTN (hypertension)   . Thyroid disease   . Stroke     TIA  . Cancer   . Seizures    Past Surgical History:  Past Surgical History  Procedure Laterality Date  . Hernia repair      x 2  . Prostate surgery    . Back surgery      cervical spine   HPI:  This is 79 year old, [name] who presents with generalized weakness and is found to have acute renal failure and urinary retention. His UTI seems to have cleared up. He has had significant weight loss in the last few months. He has a prolonged QT, probably from Haldol and use of Cipro recently. He also has lower extremity edema, which is concerning for DVT, considering his relative immobilization.  Pt lives at home with his wife and was active and independent prior to April hospital admission and has had a slow decline since then. Pt with weight loss and reports of difficulty swallowing per chart review.  PMHx significant for stroke with right sided weakness, HTN, prostate cancer and seizures. Chest xray shows low lung volumes.   Assessment / Plan / Recommendation Clinical Impression  Seemingly cognitve-based dysphagia in setting of medical decline/weakness characterized by reduced awareness of bolus with cues to move bolus with tongue and "chew", weak lingual movement for bolus manipulation, and suspected premature spillage with teaspoon presentation of thins. Pt with mild/mod oral residue which clears with sips of liquid and reminders to "swallow again". Pt alert and cooperative, responding appropriately to questions after a brief delay, masque-like facies, and decreased initiation often seen in parkinsonian patients. Pt denies history of Parkinson's.     Aspiration  Risk  Moderate    Diet Recommendation Dysphagia 1 (Puree);Thin liquid (pt requesting "mashed food", but tolerates soft with cues)   Liquid Administration via: Cup;Straw Medication Administration: Whole meds with puree Supervision: Full supervision/cueing for compensatory strategies Compensations: Slow rate;Small sips/bites;Check for pocketing;Multiple dry swallows after each bite/sip;Follow solids with liquid Postural Changes and/or Swallow Maneuvers: Seated upright 90 degrees;Upright 30-60 min after meal    Other  Recommendations Oral Care Recommendations: Oral care BID Other Recommendations: Clarify dietary restrictions   Follow Up Recommendations  Skilled Nursing facility    Frequency and Duration min 2x/week  1 week      SLP Swallow Goals Patient will consume recommended diet without observed clinical signs of aspiration with: Minimal assistance Patient will utilize recommended strategies during swallow to increase swallowing safety with: Minimal assistance   Swallow Study Prior Functional Status   Dramatic decline in function over past few months.    General Date of Onset: 12/22/12 HPI: This is 79 year old, [name] who presents with generalized weakness and is found to have acute renal failure and urinary retention. His UTI seems to have cleared up. He has had significant weight loss in the last few months. He has a prolonged QT, probably from Haldol and use of Cipro recently. He also has lower extremity edema, which is concerning for DVT, considering his relative immobilization.  Pt lives at home with his wife and was active and independent prior to April hospital admission and has had a slow decline since then. Pt with weight loss and reports of difficulty  swallowing per chart review.  PMHx significant for stroke with right sided weakness, HTN, prostate cancer and seizures. Chest xray shows low lung volumes. Type of Study: Bedside swallow evaluation Previous  Swallow Assessment: None on record Diet Prior to this Study: Dysphagia 1 (puree);Dysphagia 3 (soft) (puree tray brought to room) Temperature Spikes Noted: No Respiratory Status: Room air History of Recent Intubation: No Behavior/Cognition: Cooperative;Pleasant mood;Requires cueing;Lethargic (flat) Oral Cavity - Dentition: Dentures, top;Dentures, bottom Self-Feeding Abilities: Needs assist Patient Positioning: Upright in bed Baseline Vocal Quality: Clear Volitional Cough: Weak Volitional Swallow: Able to elicit    Oral/Motor/Sensory Function Overall Oral Motor/Sensory Function: Impaired Labial ROM: Reduced right;Reduced left Labial Strength: Reduced Lingual ROM: Reduced right;Reduced left Lingual Strength: Reduced Facial Strength: Reduced Mandible: Impaired (limited opening)   Ice Chips Ice chips: Within functional limits Presentation: Spoon   Thin Liquid Thin Liquid: Impaired Presentation: Spoon;Cup;Straw Pharyngeal  Phase Impairments: Cough - Immediate Other Comments: immediate cough x1 when presented with teaspoon thin ("That surprised me."), otherwise ok.    Nectar Thick Nectar Thick Liquid: Not tested   Honey Thick Honey Thick Liquid: Not tested   Puree Puree: Impaired Presentation: Spoon Oral Phase Impairments: Reduced lingual movement/coordination Other Comments: decreased attention to task, pt reminded to move bolus around in mouth- better when cued to "chew" even the purees   Solid   GO    Solid: Impaired Presentation: Spoon Oral Phase Impairments: Reduced lingual movement/coordination Oral Phase Functional Implications: Oral residue;Oral holding Other Comments:  (improved when cued to chew and alternated liquids)      Thank you,  Havery Moros, CCC-SLP [phone]  Latrese Carolan 12/23/2012,8:08 PM

## 2012-12-23 NOTE — Progress Notes (Signed)
NAMEREX, OESTERLE             ACCOUNT NO.:  [id number]  MEDICAL RECORD NO.:  [id number]  LOCATION:  A328                          FACILITY:  [facility]  PHYSICIAN:  Mila Homer. Sudie Bailey, M.D.DATE OF BIRTH:  11-30-1920  DATE OF PROCEDURE: DATE OF DISCHARGE:                                PROGRESS NOTE   SUBJECTIVE:  He was brought to the hospital last night after being seen in the office and felt to have deteriorated significantly.  In the emergency room he was found to have urinary retention with 800 mL of urine drained by Foley catheter.  Potassium was 3.0.  His D-dimer was elevated at 3.47.  He is now on IV fluids and on Lovenox injections.  OBJECTIVE:  His wife is in the room with him.  His temperature is 99.3, pulse 85, respiratory rate 18, blood pressure 118/71.  He is semi- recumbent in bed. HEART:  A fairly regular rhythm with a rate of about 80. LUNGS:  Clear throughout.  He is moving air well.  He does have edema of both distal legs from the knees down to the feet, probably about 1 to 2+ pitting.  There is no pain on palpation throughout the campus.  Blood tests today showed a potassium 2.9, down from 3.08 yesterday, and his BUN was 30, creatinine 1.75 compared to yesterday's 32 and 2.12.  His AST and ALT were still elevated at 175/62 and bilirubin at 1.3.  His hemoglobin was 9.0.  ASSESSMENT: 1. Urinary retention. 2. Elevated D-dimer, question deep venous thrombosis. 3. Hypokalemia. 4. Generalized weakness. 5. Anemia, which is chronic. 6. Chronic renal insufficiency with acute insufficiency superimposed     upon this.  PLAN:  He is due to get venous Dopplers of his legs today.  We will continue with the Lovenox.  Urology will see him.  Will repeat his BMP and liver test tomorrow.    Mila Homer. Sudie Bailey, M.D.    SDK/MEDQ  D:  12/23/2012  T:  12/23/2012  Job:  [id number]

## 2012-12-24 LAB — BASIC METABOLIC PANEL
Calcium: 8.9 mg/dL (ref 8.4–10.5)
Chloride: 105 mEq/L (ref 96–112)
Creatinine, Ser: 1.43 mg/dL — ABNORMAL HIGH (ref 0.50–1.35)
GFR calc Af Amer: 49 mL/min — ABNORMAL LOW (ref >90)
GFR calc non Af Amer: 42 mL/min — ABNORMAL LOW (ref >90)

## 2012-12-24 LAB — HEPATIC FUNCTION PANEL
Indirect Bilirubin: 1 mg/dL — ABNORMAL HIGH (ref 0.3–0.9)
Total Protein: 5.8 g/dL — ABNORMAL LOW (ref 6.0–8.3)

## 2012-12-24 LAB — CBC
MCHC: 33.3 g/dL (ref 30.0–36.0)
MCV: 85.8 fL (ref 78.0–100.0)
Platelets: 153 10*3/uL (ref 150–400)
RDW: 13.9 % (ref 11.5–15.5)
WBC: 10.6 10*3/uL — ABNORMAL HIGH (ref 4.0–10.5)

## 2012-12-24 MED ORDER — ENOXAPARIN SODIUM 40 MG/0.4ML ~~LOC~~ SOLN
40.0000 mg | SUBCUTANEOUS | Status: DC
  Administered 2012-12-24 – 2012-12-26 (×3): 40 mg via SUBCUTANEOUS
  Filled 2012-12-24 (×3): qty 0.4

## 2012-12-24 MED ORDER — POTASSIUM CHLORIDE 20 MEQ/15ML (10%) PO LIQD
40.0000 meq | Freq: Two times a day (BID) | ORAL | Status: DC
  Administered 2012-12-24 – 2012-12-25 (×3): 40 meq via ORAL
  Filled 2012-12-24 (×3): qty 30

## 2012-12-24 NOTE — Progress Notes (Signed)
Speech Language Pathology Dysphagia Treatment Patient Details Name: [name] MRN: [id number] DOB: Jun 02, 1921 Today'Gregory Date: 12/24/2012 Time: 4098-1191 SLP Time Calculation (min): 24 min  Assessment / Plan / Recommendation Clinical Impression  Pt was seen for dysphagia tx in room with family members present. Pt was upright in bed and answering questions accurately and clearly. However, pt appeared very tired and weak. Pt stated he was hungry and SLP provided puree item with thin liquids. Pt was unable to sip thin liquids through straw but took small cup sips ~52ml with no immediate Gregory/sx of aspiration. However, a delayed cough was noted ~30-45 seconds after swallow. Noted good laryngeal elevation in 75% of swallows. In cases where swallow was weak, pt was receptive to multiple swallow cues and this yielded no Gregory/sx of aspiration following said compens strategy. Given small spoon presentations of pureed items, pt demonstrated very weak mastication pattern and suspected weak tongue base retraction. However, pt able to consume this without overt Gregory/sx of aspiration given verbal cues to swallow hard. SLP spoke with family and nursing regarding slow rates of feeding, upright posture during meals, and making sure pt only ate recommended textures at this time until strength began to improve.      Diet Recommendation  Continue with Current Diet: Dysphagia 1 (puree);Thin liquid           Swallowing Goals  SLP Swallowing Goals Patient will consume recommended diet without observed clinical signs of aspiration with: Minimal assistance Patient will utilize recommended strategies during swallow to increase swallowing safety with: Minimal assistance  General Respiratory Status: Room air Behavior/Cognition: Cooperative;Pleasant mood;Requires cueing;Lethargic Oral Cavity - Dentition: Dentures, top;Dentures, bottom    Dysphagia Treatment Treatment focused on: Skilled observation of diet  tolerance;Patient/family/caregiver education;Facilitation of pharyngeal phase;Utilization of compensatory strategies Family/Caregiver Educated: Spoke to family about following dietary recommendations, alternating solids/liquids, waiting for swallow before giving pt another bite, adequate posture for feeding Treatment Methods/Modalities: Skilled observation Patient observed directly with PO'Gregory: Yes Type of PO'Gregory observed: Dysphagia 1 (puree);Thin liquids Feeding: Total assist Liquids provided via: Cup Oral Phase Signs & Symptoms: Prolonged mastication Pharyngeal Phase Signs & Symptoms: Delayed cough Type of cueing: Verbal;Tactile Amount of cueing: Moderate   GO     [name] 12/24/2012, 9:50 AM

## 2012-12-24 NOTE — Progress Notes (Signed)
[name], [name]             ACCOUNT NO.:  [id number]  MEDICAL RECORD NO.:  [id number]  LOCATION:  A328                          FACILITY:  [facility]  PHYSICIAN:  Mila Homer. Sudie Bailey, M.D.DATE OF BIRTH:  Aug 07, 1920  DATE OF PROCEDURE: DATE OF DISCHARGE:                                PROGRESS NOTE   SUBJECTIVE:  Feeling a good deal better today.  His daughter is in the room and tells me that she helped him eat breakfast yesterday.  He did have a venous Doppler.  He continues with a Foley.  OBJECTIVE:  GENERAL:  He is sitting up in bed.  He appears to be oriented and alert.  He is able to talk to me today but looks still pretty weak. VITAL SIGNS:  Temperature is 98.4, pulse 80, respiratory rate 18, blood pressure 136/51, O2 sats 100%. HEART:  Regular rhythm.  Rate of 80. LUNGS:  Appear to be clear throughout.  He is moving air well.  His abdomen is soft without tenderness.   Today's blood test showed a potassium 3.0, BUN 23, creatinine 1.43.  His albumin is 2.9.  AST is 112 down from 175 and ALT 53, down from 62.  His bilirubin is stable at 1.3.  His white cell count is 10,600, hemoglobin stable at 9.7.  Venous Dopplers of the lower legs did show a popliteal cyst on the right but no sign of DVT.  ASSESSMENT: 1. Urinary retention. 2. Elevated D-dimer with no sign of DVT. 3. Hypokalemia. 4. Generalized weakness secondary to dehydration and hypokalemia. 5. Chronic anemia. 6. Acute on chronic renal insufficiency, improved.  PLAN:  I am starting him on higher dose potassium today.  He is due to see the urologist.  We will have physical therapy try to get him up and moving.     Mila Homer. Sudie Bailey, M.D.     SDK/MEDQ  D:  12/24/2012  T:  12/24/2012  Job:  [id number]

## 2012-12-24 NOTE — Plan of Care (Signed)
Problem: Phase I Progression Outcomes Goal: Voiding-avoid urinary catheter unless indicated Outcome: Not Progressing Urology has been consulted.  Awaiting  Consultation.

## 2012-12-24 NOTE — Clinical Social Work Psychosocial (Signed)
Clinical Social Work Department BRIEF PSYCHOSOCIAL ASSESSMENT 12/24/2012  Patient:  [name], [name]     Account Number:  [id number]     Admit date:  12/22/2012  Clinical Social Worker:  Nancie Neas  Date/Time:  12/24/2012 10:45 AM  Referred by:  CSW  Date Referred:  12/24/2012 Referred for  SNF Placement   Other Referral:   Interview type:  Patient Other interview type:   and niece and step-daughter    PSYCHOSOCIAL DATA Living Status:  WIFE Admitted from facility:   Level of care:   Primary support name:  Larita Fife Primary support relationship to patient:  SPOUSE Degree of support available:   supportive    CURRENT CONCERNS Current Concerns  Post-Acute Placement   Other Concerns:    SOCIAL WORK ASSESSMENT / PLAN CSW met with pt at bedside, including pt's step-daughter and niece with pt's permission. Pt alert and oriented and reports he lives with his wife. Family appears to be involved and supportive. At baseline, pt ambulates with a walker or cane. He is fairly independent with ADLs, but does require some assistance with bathing. Family indicate pt had a UTI several months ago and they requested that he stop driving at that time and has not driven since. Pt evaluated by PT yesterday afternoon and recommendation is for SNF. CSW discussed placement process as pt has never been before. He is agreeable and requests [place] first. SNF list provided. Discussed [employer] authorization process and copays.   Assessment/plan status:  Psychosocial Support/Ongoing Assessment of Needs Other assessment/ plan:   Information/referral to community resources:   SNF list    PATIENT'S/FAMILY'S RESPONSE TO PLAN OF CARE: Pt feels he will rehab the best at SNF prior to returning home. Pt's step-daughter was somewhat surprised that pt was agreeable to go. CSW will fax out FL2, initiate [employer] Medicare auth and follow up with bed offers when available.       Derenda Fennel,  [place] [phone]

## 2012-12-24 NOTE — Progress Notes (Signed)
Urology called by secretary this AM about consultation, spoke directly with MD.  MD called again this afternoon with no response.

## 2012-12-24 NOTE — Clinical Social Work Placement (Signed)
Clinical Social Work Department CLINICAL SOCIAL WORK PLACEMENT NOTE 12/24/2012  Patient:  [name], [name]  Account Number:  [id number] Admit date:  12/22/2012  Clinical Social Worker:  Derenda Fennel, LCSW  Date/time:  12/24/2012 10:40 AM  Clinical Social Work is seeking post-discharge placement for this patient at the following level of care:   SKILLED NURSING   (*CSW will update this form in Epic as items are completed)   12/24/2012  Patient/family provided with [facility] Department of Clinical Social Work's list of facilities offering this level of care within the geographic area requested by the patient (or if unable, by the patient's family).  12/24/2012  Patient/family informed of their freedom to choose among providers that offer the needed level of care, that participate in Medicare, Medicaid or managed care program needed by the patient, have an available bed and are willing to accept the patient.  12/24/2012  Patient/family informed of [facility]' ownership interest in [facility], as well as of the fact that they are under no obligation to receive care at this facility.  PASARR submitted to EDS on 12/23/2012 PASARR number received from EDS on 12/23/2012  FL2 transmitted to all facilities in geographic area requested by pt/family on  12/24/2012 FL2 transmitted to all facilities within larger geographic area on   Patient informed that his/her managed care company has contracts with or will negotiate with  certain facilities, including the following:     Patient/family informed of bed offers received:   Patient chooses bed at  Physician recommends and patient chooses bed at    Patient to be transferred to  on   Patient to be transferred to facility by   The following physician request were entered in Epic:   Additional Comments:  Derenda Fennel, LCSW [phone]

## 2012-12-24 NOTE — Progress Notes (Signed)
ANTICOAGULATION CONSULT NOTE - Initial Consult  Pharmacy Consult for Lovenox Indication: VTE prophylaxis (w/u ongoing for possible DVT)  Allergies  Allergen Reactions  . Aspirin     REACTION: "elevated pulse". Cannot take any strength higher than 81mg    Patient Measurements: Height: 5\' 10"  (177.8 cm) Weight: 166 lb (75.297 kg) IBW/kg (Calculated) : 73  Vital Signs: Temp: 98.4 F (36.9 C) (07/23 0639) BP: 136/51 mmHg (07/23 0639) Pulse Rate: 80 (07/23 0639)  Labs:  Recent Labs  12/22/12 2145 12/23/12 0541 12/24/12 0625  HGB 10.5* 9.0* 9.7*  HCT 31.2* 26.9* 29.1*  PLT 166 152 153  CREATININE 2.12* 1.75* 1.43*  TROPONINI <0.30  --   --    Estimated Creatinine Clearance: 36.2 ml/min (by C-G formula based on Cr of 1.43).  Medical History: Past Medical History  Diagnosis Date  . HTN (hypertension)   . Thyroid disease   . Stroke     TIA  . Cancer   . Seizures    Medications:  Scheduled:  . aspirin EC  81 mg Oral Daily  . enoxaparin (LOVENOX) injection  40 mg Subcutaneous Q24H  . feeding supplement  237 mL Oral BID BM  . gabapentin  100 mg Oral QHS  . levETIRAcetam  750 mg Oral BID  . levothyroxine  112 mcg Oral BID  . LORazepam  0.5 mg Oral BID  . memantine  10 mg Oral BID  . potassium chloride  40 mEq Oral BID  . sodium chloride  3 mL Intravenous Q12H    Assessment: 79yo male admitted due to detioration, chronic anemia, ARI on CRI and generalized weakness.  Dopplers pending for possible DVT.  Initiating Lovenox for VTE prophylaxis for now.  F/U dopplers.    Estimated Creatinine Clearance: 36.2 ml/min (by C-G formula based on Cr of 1.43).  Goal of Therapy:  VTE prophylaxis Monitor platelets by anticoagulation protocol: Yes   Plan:  Lovenox 40mg  SQ q24hrs F/U dopplers to r/o DVT Monitor CBC  Eriyanna Kofoed A 12/24/2012,10:34 AM

## 2012-12-24 NOTE — Clinical Documentation Improvement (Signed)
THIS DOCUMENT IS NOT A PERMANENT PART OF THE MEDICAL RECORD  Please update your documentation with the medical record to reflect your response to this query. If you need help knowing how to do this please call [phone].  12/24/12   Dear Dr. Sudie Bailey / Associates,  In a better effort to capture your patient's severity of illness, reflect appropriate length of stay and utilization of resources, a review of the patient medical record has revealed the following indicators.    Based on your clinical judgment, please clarify and document in a progress note and/or discharge summary the clinical condition associated with the following supporting information:  In responding to this query please exercise your independent judgment.  The fact that a query is asked, does not imply that any particular answer is desired or expected.  Possible Clinical Conditions?   _______Severe Malnutrition    _______Severe Protein Calorie Malnutrition  _______Other Condition  _______Cannot clinically determine     Supporting Information: DOCUMENTATION CODES Per approved criteria -Severe malnutrition in the context of acute illness  Risk Factors:(As per notes) " Acute Renal Failure"   Signs & Symptoms: Ht: 5\' 10"      Wt: 166lbs  BMI:  23.82 kg/(m^2).  Diagnostics: (As per Nutrition Assessment) "Pt meets criteria for severe malnutrition in the context of acute illness given his severe unplanned wt loss 24#, 13% x 90 days. Inadequate energy intake </=50% for >/= 5 days."   Treatment : INTERVENTION:  . Ensure Complete po BID, each supplement provides 350 kcal and 13 grams of protein.  Nutrition Consult BY Francene Boyers, RD at 12/23/2012   You may use possible, probable, or suspect with inpatient documentation. possible, probable, suspected diagnoses MUST be documented at the time of discharge  Reviewed:  no additional documentation provided= no response  Thank You,  Nevin Bloodgood RN, BSN  CCDS Clinical Documentation Specialist: [phone] Cell= [phone] [employer]

## 2012-12-24 NOTE — Progress Notes (Signed)
Physical Therapy Treatment Patient Details Name: Dirck Butch MRN: [id number] DOB: Aug 20, 1920 Today's Date: 12/24/2012 Time: 4098-1191 PT Time Calculation (min): 25 min  PT Assessment / Plan / Recommendation  History of Present Illness     Clinical Impression Pt continues to be very lethargic and significantly deconditioned.  Max verbal cueing for technique and assistance to complete all bed exercise and bed mobility.  Pt left in bed with family members present and call bell within reach.     Follow Up Recommendations        Does the patient have the potential to tolerate intense rehabilitation     Barriers to Discharge        Equipment Recommendations       Recommendations for Other Services    Frequency     Progress towards PT Goals    Plan      Precautions / Restrictions Precautions Precautions: Fall Restrictions Weight Bearing Restrictions: No    Mobility  Bed Mobility Bed Mobility: Rolling Left;Rolling Right Rolling Right: 2: Max assist Rolling Left: 2: Max assist Transfers Transfers: Not assessed    Exercises General Exercises - Upper Extremity Shoulder Flexion: AAROM;5 reps;Supine General Exercises - Lower Extremity Ankle Circles/Pumps: AAROM;10 reps;Both Heel Slides: AAROM;5 reps   PT Diagnosis:    PT Problem List:   PT Treatment Interventions:     PT Goals (current goals can now be found in the care plan section)    Visit Information  Last PT Received On: 12/24/12    Subjective Data      Cognition  Cognition Arousal/Alertness: Lethargic Behavior During Therapy: Flat affect Overall Cognitive Status: Within Functional Limits for tasks assessed    Balance     End of Session PT - End of Session Activity Tolerance: Patient limited by fatigue;Patient limited by lethargy Patient left: in bed;with call bell/phone within reach;with family/visitor present   GP     Juel Burrow 12/24/2012, 6:50 PM

## 2012-12-25 ENCOUNTER — Encounter (HOSPITAL_COMMUNITY)
Admission: EM | Disposition: A | Discharge status: Home Health | Payer: Self-pay | Source: Home / Self Care | Attending: Family Medicine

## 2012-12-25 HISTORY — PX: CYSTOSCOPY: SHX5120

## 2012-12-25 LAB — BASIC METABOLIC PANEL
CO2: 29 mEq/L (ref 19–32)
Chloride: 108 mEq/L (ref 96–112)
Potassium: 3.7 mEq/L (ref 3.5–5.1)
Sodium: 143 mEq/L (ref 135–145)

## 2012-12-25 SURGERY — CYSTOSCOPY, FLEXIBLE
Anesthesia: LOCAL | Site: Bladder | Wound class: Clean Contaminated

## 2012-12-25 MED ORDER — LIDOCAINE HCL 2 % EX GEL
CUTANEOUS | Status: DC | PRN
  Administered 2012-12-25: 1 via URETHRAL

## 2012-12-25 MED ORDER — LIDOCAINE HCL 2 % EX GEL
CUTANEOUS | Status: AC
  Filled 2012-12-25: qty 10

## 2012-12-25 MED ORDER — POTASSIUM CHLORIDE CRYS ER 10 MEQ PO TBCR
10.0000 meq | EXTENDED_RELEASE_TABLET | Freq: Two times a day (BID) | ORAL | Status: DC
  Administered 2012-12-25 – 2012-12-30 (×10): 10 meq via ORAL
  Filled 2012-12-25 (×11): qty 1

## 2012-12-25 MED ORDER — SODIUM CHLORIDE 0.9 % IR SOLN
Status: DC | PRN
  Administered 2012-12-25: 3000 mL via INTRAVESICAL
  Administered 2012-12-25: 1000 mL

## 2012-12-25 SURGICAL SUPPLY — 20 items
CLOTH BEACON ORANGE TIMEOUT ST (SAFETY) ×2 IMPLANT
DRAPE LAPAROTOMY TRNSV 102X78 (DRAPE) ×2 IMPLANT
DRAPE PROXIMA HALF (DRAPES) ×2 IMPLANT
GLOVE BIO SURGEON STRL SZ7 (GLOVE) ×2 IMPLANT
GLOVE BIOGEL PI IND STRL 7.0 (GLOVE) IMPLANT
GLOVE BIOGEL PI INDICATOR 7.0 (GLOVE) ×2
GLOVE EXAM NITRILE LRG STRL (GLOVE) ×1 IMPLANT
GLOVE SS BIOGEL STRL SZ 6.5 (GLOVE) IMPLANT
GLOVE SUPERSENSE BIOGEL SZ 6.5 (GLOVE) ×1
GOWN STRL REIN XL XLG (GOWN DISPOSABLE) ×4 IMPLANT
IV NS IRRIG 3000ML ARTHROMATIC (IV SOLUTION) ×2 IMPLANT
MARKER SKIN DUAL TIP RULER LAB (MISCELLANEOUS) ×2 IMPLANT
NS IRRIG 1000ML POUR BTL (IV SOLUTION) ×1 IMPLANT
SET IRRIG Y TYPE TUR BLADDER L (SET/KITS/TRAYS/PACK) ×2 IMPLANT
SET IRRIGATING DISP (SET/KITS/TRAYS/PACK) ×2 IMPLANT
SPONGE GAUZE 4X4 12PLY (GAUZE/BANDAGES/DRESSINGS) ×2 IMPLANT
TOWEL OR 17X26 4PK STRL BLUE (TOWEL DISPOSABLE) ×2 IMPLANT
TRAY FOLEY CATH 16FR SILVER (SET/KITS/TRAYS/PACK) IMPLANT
VALVE DISPOSABLE (MISCELLANEOUS) ×2 IMPLANT
YANKAUER SUCT BULB TIP 10FT TU (MISCELLANEOUS) ×2 IMPLANT

## 2012-12-25 NOTE — Clinical Social Work Note (Signed)
CSW met with pt and pt's wife at bedside to further discuss d/c planning. Pt's wife reports she really wants to take pt home. She states they can manage pt just fine at home with home health. Pt's step-daughter is a CNA and said she can be available as much as needed. CSW notified [employer] of change in plan and will sign off. CM also aware.  Derenda Fennel, [place] [phone]

## 2012-12-25 NOTE — OR Nursing (Addendum)
Pt. Had large mushy  Brown stool upon arrival to preop. .  Pt. Also unable to take deep breaths and cough.  Sat 88 on room air . Placed on o2 at 3 liters per min.  Sat 94 %  Sat keeps droping to 88% .  Non rebreather mask applied at 15%

## 2012-12-25 NOTE — Brief Op Note (Signed)
12/22/2012 - 12/25/2012  2:34 PM  PATIENT:  [name]  79 y.o. male  PRE-OPERATIVE DIAGNOSIS:  urinary retention  POST-OPERATIVE DIAGNOSIS:  acute urinary retention, prostate regrowth blocking bladder  PROCEDURE:  Procedure(s): CYSTOSCOPY FLEXIBLE (N/A)  SURGEON:  Surgeon(s) and Role:    * Ky Barban, MD - Primary  PHYSICIAN ASSISTANT:   ASSISTANTS: none   ANESTHESIA:   local  EBL:  Total I/O In: 200 [P.O.:200] Out: 200 [Urine:200]  BLOOD ADMINISTERED:none  DRAINS: none   LOCAL MEDICATIONS USED:  NONE  SPECIMEN:  No Specimen  DISPOSITION OF SPECIMEN:  N/A  COUNTS:  YES  TOURNIQUET:  * No tourniquets in log *  DICTATION: .Other Dictation: Dictation Number [id number]  PLAN OF CARE: Admit for overnight observation  PATIENT DISPOSITION:  PACU - hemodynamically stable.   Delay start of Pharmacological VTE agent (>24hrs) due to surgical blood loss or risk of bleeding:

## 2012-12-25 NOTE — OR Nursing (Signed)
Sat maintaining at 100% with non rebreather mask, set @ 15 L  /Min

## 2012-12-25 NOTE — OR Nursing (Signed)
Had difficulty picking up sat via finger , placed probe to ear able to capture sat at 100 % with non rebreather  mask

## 2012-12-25 NOTE — Progress Notes (Signed)
Patient called nurses station for his nurse. Patient is very congested and has a hard time coughing anything up, he also has a very weak gag reflex. Set up suction in the room to assist in removing secretions.

## 2012-12-25 NOTE — Progress Notes (Signed)
[name], [name]             ACCOUNT NO.:  [id number]  MEDICAL RECORD NO.:  [id number]  LOCATION:  A328                          FACILITY:  [facility]  PHYSICIAN:  Mila Homer. Sudie Bailey, M.D.DATE OF BIRTH:  1921/01/28  DATE OF PROCEDURE: DATE OF DISCHARGE:                                PROGRESS NOTE   SUBJECTIVE:  He does feel better.  His daughter is in the room with him and was there last night when he had a choking episode.  A significant amount of thick-whitish material was suctioned off, from his throat at that time.  He continues with a catheter.  He is due to have cystoscopy done by Dr. Jerre Simon, urologist, today.  OBJECTIVE:  He is sitting up in bed.  He has a Foley catheter in place.  His speech is normal, but slow.  His temperature is 99.1, the pulse 81, respiratory rate 20, blood pressure 125/72.  The O2 sats 99%. His heart has a regular rhythm with a rate of 80.  His lungs appear to be clear throughout.  His daughter showed me her cell phone picture of his finished breakfast, indicating his appetite is better and he is eating better.  ASSESSMENT: 1. Urinary retention.  2.  Hypokalemia.  3.  Generalized weakness,     improved.  4.  Acute on chronic renal insufficiency, improved.  PLAN:  He will have the cystoscopy, as noted above.  I inspected the material which was suctioned up last night.  Iit really does not look like infection, but might represent reflux from the stomach while he is in the supine position.  I do not think he needs antibiotics at present.  I also reviewed his potassium level, which has come up to 3.7 today, which is excellent.  I will discontinue the potassium chloride 40 b.i.d. and switch to potassium chloride 10 b.i.d.     Mila Homer. Sudie Bailey, M.D.     SDK/MEDQ  D:  12/25/2012  T:  12/25/2012  Job:  [id number]

## 2012-12-25 NOTE — Progress Notes (Signed)
Physical Therapy Treatment Patient Details Name: [name] MRN: [id number] DOB: 1921/03/19 Today's Date: 12/25/2012 Time: 8295-6213 PT Time Calculation (min): 47 min  PT Assessment / Plan / Recommendation  History of Present Illness     Clinical Impression Pt more alert and willing to participate with therapy today.  Max assistance required with bed mobility, pt most active with UE reaching towards handrails this session to assist with rolling.  Pt able to sit EOB x 20 minutes with min assistance to reduce posterior lean.  Pt very limited by fatigue following sitting.  Pt left in bed with call bell within reach and family members in room.     Follow Up Recommendations        Does the patient have the potential to tolerate intense rehabilitation     Barriers to Discharge        Equipment Recommendations       Recommendations for Other Services    Frequency     Progress towards PT Goals    Plan      Precautions / Restrictions Precautions Precautions: Fall Restrictions Weight Bearing Restrictions: No    Mobility  Bed Mobility Bed Mobility: Left Sidelying to Sit;Sitting - Scoot to [place] of Bed Rolling Right: 1: +2 Total assist Rolling Right: Patient Percentage: 10% Rolling Left: 1: +2 Total assist Rolling Left: Patient Percentage: 10% Left Sidelying to Sit: 2: Max assist Details for Bed Mobility Assistance: Pt able to sit without support and complete therex activities for 20 minutes, limited by fatigue    Exercises General Exercises - Upper Extremity Shoulder Flexion: AAROM;Both;10 reps;Supine Elbow Flexion: AROM;Both;10 reps;Supine Elbow Extension: AROM;Both;10 reps;Supine General Exercises - Lower Extremity Ankle Circles/Pumps: AAROM;10 reps;Both;Supine Long Arc Quad: AROM;Both;10 reps;Seated Hip ABduction/ADduction: AROM;Both;10 reps;Seated Other Exercises Other Exercises: leaning forward 5x Other Exercises: UE pushups 5x    PT Diagnosis:    PT Problem  List:   PT Treatment Interventions:     PT Goals (current goals can now be found in the care plan section)    Visit Information  Last PT Received On: 12/25/12    Subjective Data      Cognition  Cognition Arousal/Alertness: Awake/alert Behavior During Therapy: Flat affect Overall Cognitive Status: Within Functional Limits for tasks assessed    Balance     End of Session PT - End of Session Activity Tolerance: Patient limited by fatigue Patient left: in bed;with call bell/phone within reach;with family/visitor present Nurse Communication: Mobility status   GP     Juel Burrow 12/25/2012, 11:32 AM

## 2012-12-25 NOTE — Evaluation (Signed)
Occupational Therapy Evaluation Patient Details Name: Josealfredo Adkins MRN: [id number] DOB: September 28, 1920 Today's Date: 12/25/2012 Time: 0865-7846 OT Time Calculation (min): 30 min  OT Assessment / Plan / Recommendation     Clinical Impression   Patient is a 79 y/o male s/p ARF presenting to acute OT with deficits below. Patient will benefit from OT services to increase ADL performance, functional transfer, and BUE strength and endurance. Recommend SNF at d/c.    OT Assessment  Patient needs continued OT Services    Follow Up Recommendations  SNF       Equipment Recommendations  None recommended by OT       Frequency  Min 2X/week    Precautions / Restrictions Precautions Precautions: Fall   Pertinent Vitals/Pain No complaints.    ADL  Eating/Feeding: Performed;+1 Total assistance Where Assessed - Eating/Feeding: Bed level Grooming: Performed;Denture care;+1 Total assistance Where Assessed - Grooming: Supine, head of bed up Upper Body Dressing: Performed;+1 Total assistance Where Assessed - Upper Body Dressing: Supine, head of bed flat Lower Body Dressing: Simulated;+2 Total assistance Lower Body Dressing: Patient Percentage: 10% Where Assessed - Lower Body Dressing: Rolling right and/or left Toileting - Clothing Manipulation and Hygiene: Performed;+2 Total assistance Toileting - Clothing Manipulation and Hygiene: Patient Percentage: 0% Where Assessed - Toileting Clothing Manipulation and Hygiene: Rolling right and/or left Transfers/Ambulation Related to ADLs: Patient unable to transfer to chair this date.    OT Diagnosis: Generalized weakness  OT Problem List: Decreased strength;Decreased range of motion;Decreased activity tolerance;Impaired balance (sitting and/or standing);Decreased coordination OT Treatment Interventions: Self-care/ADL training;Therapeutic exercise;Manual therapy;Patient/family education;Therapeutic activities;Balance training;Modalities   OT  Goals(Current goals can be found in the care plan section)    Visit Information  Last OT Received On: 12/25/12       Prior Functioning     Home Living Family/patient expects to be discharged to:: Private residence Living Arrangements: Spouse/significant other Available Help at Discharge: Family;Available 24 hours/day Type of Home: House Home Access: Stairs to enter [employer] of Steps: 2 Entrance Stairs-Rails: None Home Layout: One level Home Equipment: Walker - 2 wheels;Cane - single point;Shower seat Prior Function Level of Independence: Needs assistance Communication Communication: No difficulties Dominant Hand: Right            Cognition  Cognition Arousal/Alertness: Awake/alert Behavior During Therapy: Flat affect Overall Cognitive Status: Within Functional Limits for tasks assessed    Extremity/Trunk Assessment Upper Extremity Assessment Upper Extremity Assessment: RUE deficits/detail;LUE deficits/detail RUE Deficits / Details: AROM shoulder flexion less than 90 degrees. MMT: 2-/5 shoulder ranges. AROM elbow ranges WFL. MMT:3/5 elbow ranges.  RUE Coordination: decreased fine motor;decreased gross motor LUE Deficits / Details: AROM shoulder flexion approx. 70% full range. MMT: 2-/5 shoulder ranges. AROM elbow ranges WFL. MMT: 3/5 elbow ranges.  LUE Coordination: decreased gross motor;decreased fine motor Lower Extremity Assessment Lower Extremity Assessment: Defer to PT evaluation     Mobility Bed Mobility Bed Mobility: Rolling Left;Rolling Right Rolling Right: 1: +2 Total assist Rolling Right: Patient Percentage: 10% Rolling Left: 1: +2 Total assist Rolling Left: Patient Percentage: 10%     Exercise General Exercises - Upper Extremity Shoulder Flexion: AAROM;Both;10 reps;Supine Elbow Flexion: AROM;Both;10 reps;Supine Elbow Extension: AROM;Both;10 reps;Supine      End of Session OT - End of Session Activity Tolerance: Patient limited by  fatigue Patient left: in bed;with call bell/phone within reach;with family/visitor present    Limmie Patricia, OTR/L,CBIS   12/25/2012, 10:56 AM

## 2012-12-26 ENCOUNTER — Encounter (HOSPITAL_COMMUNITY): Payer: Self-pay | Admitting: Gastroenterology

## 2012-12-26 DIAGNOSIS — K219 Gastro-esophageal reflux disease without esophagitis: Secondary | ICD-10-CM

## 2012-12-26 DIAGNOSIS — K2970 Gastritis, unspecified, without bleeding: Secondary | ICD-10-CM

## 2012-12-26 DIAGNOSIS — K222 Esophageal obstruction: Secondary | ICD-10-CM

## 2012-12-26 DIAGNOSIS — R1310 Dysphagia, unspecified: Secondary | ICD-10-CM

## 2012-12-26 DIAGNOSIS — K228 Other specified diseases of esophagus: Secondary | ICD-10-CM

## 2012-12-26 DIAGNOSIS — K2990 Gastroduodenitis, unspecified, without bleeding: Secondary | ICD-10-CM

## 2012-12-26 LAB — CBC
Hemoglobin: 9.1 g/dL — ABNORMAL LOW (ref 13.0–17.0)
MCH: 28.9 pg (ref 26.0–34.0)
MCV: 87.3 fL (ref 78.0–100.0)
RBC: 3.15 MIL/uL — ABNORMAL LOW (ref 4.22–5.81)
WBC: 9.3 10*3/uL (ref 4.0–10.5)

## 2012-12-26 LAB — BASIC METABOLIC PANEL
CO2: 28 mEq/L (ref 19–32)
Glucose, Bld: 100 mg/dL — ABNORMAL HIGH (ref 70–99)
Potassium: 3.7 mEq/L (ref 3.5–5.1)
Sodium: 142 mEq/L (ref 135–145)

## 2012-12-26 LAB — CLOSTRIDIUM DIFFICILE BY PCR: Toxigenic C. Difficile by PCR: NEGATIVE

## 2012-12-26 MED ORDER — PANTOPRAZOLE SODIUM 40 MG IV SOLR
40.0000 mg | Freq: Two times a day (BID) | INTRAVENOUS | Status: DC
  Administered 2012-12-26: 40 mg via INTRAVENOUS
  Filled 2012-12-26: qty 40

## 2012-12-26 NOTE — Progress Notes (Signed)
Pharmacy Consult: Lovenox for VTE prophylaxis.  Patient Measurements: Height: 5\' 10"  (177.8 cm) Weight: 166 lb (75.297 kg) IBW/kg (Calculated) : 73 Body mass index is 23.82 kg/(m^2).   VITALS Filed Vitals:   12/26/12 0543  BP: 123/65  Pulse: 84  Temp: 98.5 F (36.9 C)  Resp: 20    INR Last Three Days: No results found for this basename: INR,  in the last 72 hours  CBC:    Component Value Date/Time   WBC 9.3 12/26/2012 0511   RBC 3.15* 12/26/2012 0511   HGB 9.1* 12/26/2012 0511   HCT 27.5* 12/26/2012 0511   PLT 171 12/26/2012 0511   MCV 87.3 12/26/2012 0511   MCH 28.9 12/26/2012 0511   MCHC 33.1 12/26/2012 0511   RDW 14.2 12/26/2012 0511   LYMPHSABS 1.7 12/22/2012 2145   MONOABS 1.1* 12/22/2012 2145   EOSABS 0.0 12/22/2012 2145   BASOSABS 0.0 12/22/2012 2145    RENAL FUNCTION: Estimated Creatinine Clearance: 45.4 ml/min (by C-G formula based on Cr of 1.14).  Assessment: Dose stable for age, weight, renal function and indication.  Plan: Sign off.  Mady Gemma, [facility] 12/26/2012 9:03 AM

## 2012-12-26 NOTE — Consult Note (Signed)
Referring Provider: Milana Obey, MD Primary Care Physician:  Milana Obey, MD Primary Gastroenterologist:  Normalized stature-for-age data available only for age 77 to 20 years.   Reason for Consultation:  ?night reflux  HPI: [name] is a 79 y.o. male admitted on 12/22/12 with ongoing deterioration since hospitalization in April. According to patient and family he was eating well, driving, walking prior to 09/2012. He was admitted at that time with UTI and subsequently with a seizure. Patient has had progressive decline in ambulation over back several weeks. He has had poor appetite. Reported difficultly swallowing as well. Recently given Cipro for UTI. No reports of abdominal pain, melena, brbpr, vomiting. +constipation relieved with Miralax, recently seen in ER. Last TCS at [facility] few years ago per wife, unremarkable. Reported 25 pound weight loss over past few months. In ER, 800cc urine drained with Foley catheter. He was found to have acute renal failure. He has had recently progressive urinary retention.  +LE edema. +D-dimer. Negative doppler study of bilateral lower extremities. Abdominal u/s showed suboptimal evaluation of the kidneys, pancreas and aorta were possible due to poor scanning parameters and shadowing. Probable right hydronephrosis and questionable left pelvic dilatation. Normal gallbladder and liver.  Speech therapy evaluation on 12/23/12: cognitive-based dysphagia in setting of medical decline/weakness with reduced awareness of bolus with cues to move bolus with tongue and chew, premature spillage, moderate aspiration risk. Dysphagia 1 with thin liquids recommended. Aspiration precautions.   Cystoscopy 12/25/12 for acute urinary retention and noted regrowth of adenoma with bladder neck obstruction, partial.   We were asked to see patient regarding episodes of congestion and regurgitation of clear/mucous like material occuring at night only.  According to family over the  past several weeks, patient has had increased congestion/secretions in oral cavity which occurs in the evenings. Even when supine during the day, he has no problems. Patient denies nausea/vomiting. States the secretions just come up. Notes he feels like he cannot get food/liquids to go from mouth to the esophagus. C/o anorexia. Since last week, he has been unable to ambulate at all. Wife notes that his right face/lip are drooping since this admission. He has decreased use of his right hand over the past several weeks. He has h/o TIAs. Denies melena, brbpr, abd pain. States he was evaluated by Dr. Gerilyn Pilgrim several times inpatient and outpatient. MRI brain 10/2012 showed remote thalamic infarcts, global atrophy, small area of blood breakdown products right pons.  Since admission, he is having frequent loose nonbloody stools. C.Diff pending.   Prior to Admission medications   Medication Sig Start Date End Date Taking? Authorizing Provider  aspirin EC 81 MG tablet Take 81 mg by mouth daily.   Yes Historical Provider, MD  gabapentin (NEURONTIN) 100 MG capsule Take 1 capsule (100 mg total) by mouth at bedtime. 12/22/12  Yes Vickki Hearing, MD  haloperidol (HALDOL) 1 MG tablet Take 1 mg by mouth 3 (three) times daily.   Yes Historical Provider, MD  hydrochlorothiazide (HYDRODIURIL) 25 MG tablet Take 25 mg by mouth daily.   Yes Historical Provider, MD  HYDROcodone-acetaminophen (NORCO/VICODIN) 5-325 MG per tablet Take 1-2 tablets by mouth 2 (two) times daily.   Yes Historical Provider, MD  levETIRAcetam (KEPPRA) 750 MG tablet Take 1 tablet (750 mg total) by mouth 2 (two) times daily. 10/03/12  Yes Rhetta Mura, MD  levothyroxine (SYNTHROID, LEVOTHROID) 112 MCG tablet Take 112 mcg by mouth 2 (two) times daily.    Yes Historical Provider, MD  LORazepam (  ATIVAN) 2 MG tablet Take 2 mg by mouth 2 (two) times daily.   Yes Historical Provider, MD  memantine (NAMENDA) 10 MG tablet Take 10 mg by mouth 2 (two)  times daily.   Yes Historical Provider, MD  polyethylene glycol (MIRALAX / GLYCOLAX) packet Take 17 g by mouth daily. 10/27/12  Yes Joya Gaskins, MD  potassium chloride (K-DUR) 10 MEQ tablet Take 1 tablet (10 mEq total) by mouth 2 (two) times daily. 10/01/12  Yes Milana Obey, MD    Current Facility-Administered Medications  Medication Dose Route Frequency Provider Last Rate Last Dose  . 0.9 %  sodium chloride infusion   Intravenous Continuous Milana Obey, MD 50 mL/hr at 12/25/12 2153    . acetaminophen (TYLENOL) tablet 650 mg  650 mg Oral Q6H PRN Osvaldo Shipper, MD       Or  . acetaminophen (TYLENOL) suppository 650 mg  650 mg Rectal Q6H PRN Osvaldo Shipper, MD      . aspirin EC tablet 81 mg  81 mg Oral Daily Osvaldo Shipper, MD   81 mg at 12/26/12 1055  . enoxaparin (LOVENOX) injection 40 mg  40 mg Subcutaneous Q24H Milana Obey, MD   40 mg at 12/26/12 1056  . feeding supplement (ENSURE COMPLETE) liquid 237 mL  237 mL Oral BID BM Francene Boyers, RD   237 mL at 12/26/12 1100  . gabapentin (NEURONTIN) capsule 100 mg  100 mg Oral QHS Osvaldo Shipper, MD   100 mg at 12/25/12 2154  . levETIRAcetam (KEPPRA) tablet 750 mg  750 mg Oral BID Osvaldo Shipper, MD   750 mg at 12/26/12 1056  . levothyroxine (SYNTHROID, LEVOTHROID) tablet 112 mcg  112 mcg Oral BID Osvaldo Shipper, MD   112 mcg at 12/26/12 1100  . LORazepam (ATIVAN) tablet 0.5 mg  0.5 mg Oral BID Osvaldo Shipper, MD   0.5 mg at 12/26/12 1056  . memantine (NAMENDA) tablet 10 mg  10 mg Oral BID Osvaldo Shipper, MD   10 mg at 12/26/12 1056  . ondansetron (ZOFRAN) tablet 4 mg  4 mg Oral Q6H PRN Osvaldo Shipper, MD       Or  . ondansetron (ZOFRAN) injection 4 mg  4 mg Intravenous Q6H PRN Osvaldo Shipper, MD      . potassium chloride (K-DUR,KLOR-CON) CR tablet 10 mEq  10 mEq Oral BID Milana Obey, MD   10 mEq at 12/26/12 1056  . sodium chloride 0.9 % injection 3 mL  3 mL Intravenous Q12H Osvaldo Shipper, MD   3 mL at  12/25/12 1510    Allergies as of 12/22/2012 - Review Complete 12/22/2012  Allergen Reaction Noted  . Aspirin      Past Medical History  Diagnosis Date  . HTN (hypertension)   . Thyroid disease   . Stroke     TIA  . Cancer     prostate  . Seizures   . Dementia     Past Surgical History  Procedure Laterality Date  . Hernia repair      x 2  . Prostate surgery    . Back surgery      cervical spine  . Colonoscopy      at [facility], polyp per wife    Family History  Problem Relation Age of Onset  . Arthritis    . Cancer Brother     rectal  . Asthma    . Diabetes      History   Social  History  . Marital Status: Married    Spouse Name: N/A    Number of Children: N/A  . Years of Education: college   Occupational History  . Not on file.   Social History Main Topics  . Smoking status: Never Smoker   . Smokeless tobacco: Not on file  . Alcohol Use: No  . Drug Use: No  . Sexually Active: No   Other Topics Concern  . Not on file   Social History Narrative  . No narrative on file     ROS:  General: Negative for  fever, chills. See hpi. Eyes: Negative for vision changes.  ENT: Negative for hoarseness,  nasal congestion. CV: Negative for chest pain, angina, palpitations, dyspnea on exertion, peripheral edema.  Respiratory: Negative for dyspnea at rest, dyspnea on exertion, cough, sputum, wheezing.  GI: See history of present illness. GU:  Negative for dysuria, hematuria, urinary incontinence, urinary frequency, nocturnal urination. +urinary retention MS: Negative for joint pain, low back pain.  Derm: Negative for rash or itching.  Neuro: + weakness, right sided, chronic, h/o seizure, memory loss, confusion.  Psych: Negative for anxiety, depression, suicidal ideation, hallucinations.  Endo: see hpi Heme: Negative for bruising or bleeding. Allergy: Negative for rash or hives.       Physical Examination: Vital signs in last 24 hours: Temp:  [98.5 F (36.9  C)-99 F (37.2 C)] 98.8 F (37.1 C) (07/25 1410) Pulse Rate:  [70-84] 78 (07/25 1410) Resp:  [20] 20 (07/25 1410) BP: (122-131)/(65-74) 129/67 mmHg (07/25 1410) SpO2:  [94 %-98 %] 94 % (07/25 1410) Last BM Date: 12/26/12  General: Well-nourished, well-developed in no acute distress. Answers questions appropriately. Accompanied by wife/daughter. Head: Normocephalic, atraumatic.   Eyes: Conjunctiva pink, no icterus. Mouth: Oropharyngeal mucosa moist and pink , no lesions erythema or exudate. Neck: Supple without thyromegaly, masses, or lymphadenopathy.  Lungs: Clear to auscultation bilaterally.  Heart: Regular rate and rhythm, no murmurs rubs or gallops.  Abdomen: Bowel sounds are normal, nontender, nondistended, no hepatosplenomegaly or masses, no abdominal bruits or    hernia , no rebound or guarding.   Rectal: not performed Extremities: trace lower extremity edema. No clubbing, deformity.  Neuro: Alert and oriented to person/place. Right facial drooping.  Skin: Warm and dry, no rash or jaundice.   Psych: Alert and cooperative, normal mood and affect.        Intake/Output from previous day: 07/24 0701 - 07/25 0700 In: 623 [P.O.:420; I.V.:203] Out: 200 [Urine:200] Intake/Output this shift: Total I/O In: -  Out: 594 [Urine:594]  Lab Results: CBC  Recent Labs  12/24/12 0625 12/26/12 0511  WBC 10.6* 9.3  HGB 9.7* 9.1*  HCT 29.1* 27.5*  MCV 85.8 87.3  PLT 153 171   BMET  Recent Labs  12/24/12 0625 12/25/12 0608 12/26/12 0511  NA 142 143 142  K 3.0* 3.7 3.7  CL 105 108 109  CO2 31 29 28   GLUCOSE 118* 115* 100*  BUN 23 20 16   CREATININE 1.43* 1.21 1.14  CALCIUM 8.9 9.0 8.8   LFT  Recent Labs  12/24/12 0625  BILITOT 1.3*  BILIDIR 0.3  IBILI 1.0*  ALKPHOS 56  AST 112*  ALT 53  PROT 5.8*  ALBUMIN 2.9*   No results found for this basename: LIPASE    PT/INR No results found for this basename: LABPROT, INR,  in the last 72 hours    Imaging  Studies: Dg Chest 2 View  12/22/2012   *RADIOLOGY REPORT*  Clinical Data: Weakness and cough.  Urinary tract infection.  CHEST - 2 VIEW  Comparison: Two-view chest 09/29/2012.  Findings: The heart size is exaggerated by low lung volumes.  The lungs are clear.  The visualized soft tissues and bony thorax are unremarkable.  IMPRESSION:  1.  Low lung volumes. 2.  No acute cardiopulmonary disease.   Original Report Authenticated By: Marin Roberts, M.D.   US Abdomen Complete  12/23/2012   *RADIOLOGY REPORT*  Clinical Data:  Abnormal LFTs.  Urinary retention with acute renal failure  COMPLETE ABDOMINAL ULTRASOUND  Comparison:  11/08/2006  Findings:  Gallbladder:  Is well distended and shows no evidence for intraluminal stones or sludge.  No pericholecystic fluid or gallbladder wall thickening is seen.  Evaluation for a sonographic Murphy's sign is negative  Common bile duct:  Has a width of 3.8 mm and unremarkable appearance  Liver:  Appears homogeneously echogenic with no signs of focal parenchymal abnormality or intrahepatic ductal dilatation  IVC:  The proximal portion appears normal  Pancreas:  Is not seen with confidence due to shadowing by overlying gas  Spleen:  Has a sagittal length of 6.3 cm and a homogeneous parenchymal pattern  Right Kidney:  Is poorly visualized due to shadowing.  A sagittal length of 8.6 cm is noted and suggestion of dilatation of the renal pelvis is noted on provided images with no definite caliectasis but overall assessment is compromised.  Left Kidney:  Has a sagittal length of 9.4 cm.  Overall resolution is diminished and evaluation is suboptimal. Some splaying in the sinus fat is seen and may represent dilatation on this side as well.  Abdominal aorta:  Is not assessed due to shadowing by overlying gas  IMPRESSION: Suboptimal evaluation of the kidneys, pancreas and aorta were possible due to poor scanning parameters and shadowing.  Probable right hydronephrosis and  questionable left pelvic dilatation.  Normal gallbladder and liver.   Original Report Authenticated By: Rhodia Albright, M.D.   US Venous Img Lower Bilateral  12/23/2012   *RADIOLOGY REPORT*  Bilateral lower extremity venous duplex ultrasound  History:  Bilateral lower extremity edema  Technique:  Real-time and Doppler interrogation of both lower extremity venous systems form.  Findings:  Flow in the venous structures of both lower extremities is spontaneous and phasic in all segments.  There is normal compression and augmentation in the venous structures of both lower extremities.  Venous Doppler signal is normal in all regions. There is no thrombus in the deep or visualized superficial venous structures on either side.  There is no deep venous incompetence on either side.  There is a cystic structure in the right popliteal fossa measuring 5.0 x 2.5 x 0.9 cm, probably representing a Baker's cyst. No other mass lesions are identified.  There is lower extremity soft tissue edema bilaterally.  Conclusion:  Probable Baker's cyst in the right popliteal fossa. Lower extremity soft tissue edema bilaterally.  No deep venous thrombosis in either lower extremity.   Original Report Authenticated By: Bretta Bang, M.D.  Pierre.Alas week]   Impression: 79 y/o male with progressive decline. States he has been unable to ambulate in over one week but progressively weak since 09/2012. According to family is was doing fine prior to 09/2012 and was driving. Now he is having difficulty swallowing with oropharyngeal abnormalities on ST evaluation. He is ?regurgitating liquids in the evenings but has not happened during the day despite being supine since admission. Patient acknowledges difficult swallowing. No heartburn, abdominal pain.  No prior EGD.   Family is concerned about right facial drooping they have noted since this admission. Last MRI brain in 10/2012 without acute findings but he does have h/o TIAs.   He also has LFTs  (AST/ALT) abnormal dating back to 02/2012 at least. Chronic normocytic anemia.  Diarrhea, agree with rule out C.Diff.  Plan: 1. Consider EGD vs BPE to evaluate for esophageal stricture and/or dysmotility. To discuss with Dr. Darrick Penna.  2. Management of other neurological concerns as per attending. It is not clear that he needs another MRI brain at this time. To discuss with Dr. Darrick Penna.  3. ?consider Parkinson's and/or Myasthenia Gravis work-up if no structural abnormality of esophagus noted. 4. F/U C.Diff PCR. 5. Iron/TIBC, ferritin, B12, folate, hepatitis markers  I would like to thank Dr. Sudie Bailey for allowing [place] to take part in the care of this nice patient.   LOS: 4 days   Tana Coast  12/26/2012, 2:36 PM  Addendum: discussed with Dr. Darrick Penna. Possible EGD tomorrow. Will hold ASA/Lovenox. Start SCDs. Make NPO given risk of aspiration.  Spoke with Karl Luke, RN. Requested she call covering attending to notify of family's concerns that he has new right sided facial drooping.

## 2012-12-26 NOTE — Op Note (Signed)
[name], [name]             ACCOUNT NO.:  [id number]  MEDICAL RECORD NO.:  [id number]  LOCATION:  A328                          FACILITY:  [facility]  PHYSICIAN:  Ky Barban, M.D.DATE OF BIRTH:  06/20/1920  DATE OF PROCEDURE:  12/25/2012 DATE OF DISCHARGE:                              OPERATIVE REPORT   PREOPERATIVE DIAGNOSIS:  Acute urinary retention, prostate cancer.  POSTOPERATIVE DIAGNOSIS:  Regrowth of the adenoma with bladder neck obstruction, partial.  DESCRIPTION OF PROCEDURE:  The patient was placed in supine position, usual prep and drape, Xylocaine jelly instilled into the urethra.  After waiting adequate time, flexible cystoscope was introduced under direct vision.  Anterior urethra looks normal.  Prostatic urethra shows previous evidence of TUR prostate.  There is regrowth of the adenoma causing partial bladder neck obstruction. Bladder was 2+ trabeculated. No tumor, stone, foreign body, or inflammation, and at this point, the cystoscope was removed.  I want him to have a voiding trial and see what happens.  If he is still not able to void.  I will go and do another TUR prostate.     Ky Barban, M.D.     MIJ/MEDQ  D:  12/25/2012  T:  12/26/2012  Job:  [id number]  cc:   Mila Homer. Sudie Bailey, M.D. Fax: [phone]

## 2012-12-26 NOTE — Consult Note (Signed)
REVIEWED.  EGD/POSSIBLE DILATION JUL 26 AT 0900.

## 2012-12-26 NOTE — Progress Notes (Signed)
PT Cancellation Note  Patient Details Name: [name] MRN: [id number] DOB: [birth year]   Cancelled Treatment:     Pt has had multiple loose stools today.  States that he feels terrible.  Pt does not want to participate in therapy today.   RUSSELL,CINDY 12/26/2012, 3:48 PM

## 2012-12-27 ENCOUNTER — Encounter (HOSPITAL_COMMUNITY): Payer: Self-pay | Admitting: *Deleted

## 2012-12-27 ENCOUNTER — Encounter (HOSPITAL_COMMUNITY)
Admission: EM | Disposition: A | Discharge status: Home Health | Payer: Self-pay | Source: Home / Self Care | Attending: Family Medicine

## 2012-12-27 DIAGNOSIS — R1310 Dysphagia, unspecified: Secondary | ICD-10-CM

## 2012-12-27 HISTORY — PX: ESOPHAGOGASTRODUODENOSCOPY (EGD) WITH ESOPHAGEAL DILATION: SHX5812

## 2012-12-27 LAB — KOH PREP

## 2012-12-27 LAB — IRON AND TIBC
Iron: 33 ug/dL — ABNORMAL LOW (ref 42–135)
Saturation Ratios: 22 % (ref 20–55)
UIBC: 115 ug/dL — ABNORMAL LOW (ref 125–400)

## 2012-12-27 LAB — FERRITIN: Ferritin: 194 ng/mL (ref 22–322)

## 2012-12-27 SURGERY — ESOPHAGOGASTRODUODENOSCOPY (EGD) WITH ESOPHAGEAL DILATION
Anesthesia: Moderate Sedation

## 2012-12-27 SURGERY — EGD (ESOPHAGOGASTRODUODENOSCOPY)
Anesthesia: Moderate Sedation

## 2012-12-27 MED ORDER — FLUCONAZOLE IN SODIUM CHLORIDE 200-0.9 MG/100ML-% IV SOLN
200.0000 mg | Freq: Once | INTRAVENOUS | Status: AC
  Administered 2012-12-27: 200 mg via INTRAVENOUS
  Filled 2012-12-27: qty 100

## 2012-12-27 MED ORDER — MIDAZOLAM HCL 5 MG/5ML IJ SOLN
INTRAMUSCULAR | Status: DC | PRN
  Administered 2012-12-27 (×2): 1 mg via INTRAVENOUS

## 2012-12-27 MED ORDER — MIDAZOLAM HCL 5 MG/5ML IJ SOLN
INTRAMUSCULAR | Status: AC
  Filled 2012-12-27: qty 10

## 2012-12-27 MED ORDER — MEPERIDINE HCL 100 MG/ML IJ SOLN
INTRAMUSCULAR | Status: AC
  Filled 2012-12-27: qty 1

## 2012-12-27 MED ORDER — MEPERIDINE HCL 100 MG/ML IJ SOLN
INTRAMUSCULAR | Status: DC | PRN
  Administered 2012-12-27 (×2): 25 mg via INTRAVENOUS

## 2012-12-27 MED ORDER — BISMUTH SUBSALICYLATE 262 MG/15ML PO SUSP: 30.0000 mL | ORAL | Status: DC | PRN

## 2012-12-27 MED ORDER — LOPERAMIDE HCL 2 MG PO CAPS
2.0000 mg | ORAL_CAPSULE | Freq: Three times a day (TID) | ORAL | Status: DC
  Administered 2012-12-27 – 2012-12-30 (×10): 2 mg via ORAL
  Filled 2012-12-27 (×10): qty 1

## 2012-12-27 MED ORDER — FLUCONAZOLE IN SODIUM CHLORIDE 200-0.9 MG/100ML-% IV SOLN
200.0000 mg | Freq: Once | INTRAVENOUS | Status: DC
  Filled 2012-12-27: qty 100

## 2012-12-27 MED ORDER — FLUCONAZOLE 100MG IVPB
100.0000 mg | INTRAVENOUS | Status: DC
  Administered 2012-12-28 – 2012-12-30 (×3): 100 mg via INTRAVENOUS
  Filled 2012-12-27 (×4): qty 50

## 2012-12-27 MED ORDER — FLUCONAZOLE 100MG IVPB
100.0000 mg | Freq: Once | INTRAVENOUS | Status: DC
  Filled 2012-12-27: qty 50

## 2012-12-27 MED ORDER — MINERAL OIL PO OIL
TOPICAL_OIL | ORAL | Status: AC
  Filled 2012-12-27: qty 30

## 2012-12-27 MED ORDER — PANTOPRAZOLE SODIUM 40 MG PO PACK
40.0000 mg | PACK | Freq: Two times a day (BID) | ORAL | Status: DC
  Administered 2012-12-27 – 2012-12-30 (×5): 40 mg via ORAL
  Filled 2012-12-27 (×8): qty 20

## 2012-12-27 MED ORDER — PANTOPRAZOLE SODIUM 40 MG PO TBEC
40.0000 mg | DELAYED_RELEASE_TABLET | Freq: Two times a day (BID) | ORAL | Status: DC
  Filled 2012-12-27: qty 1

## 2012-12-27 NOTE — H&P (Signed)
Primary Care Physician:  Milana Obey, MD Primary Gastroenterologist:  Dr. Darrick Penna  Pre-Procedure History & Physical: HPI:  [name] is a 79 y.o. male here for DYSPHAGIA.  Past Medical History  Diagnosis Date  . HTN (hypertension)   . Thyroid disease   . Stroke     TIA  . Cancer     prostate  . Seizures   . Dementia     Past Surgical History  Procedure Laterality Date  . Hernia repair      x 2  . Prostate surgery    . Back surgery      cervical spine  . Colonoscopy      at [facility], polyp per wife    Prior to Admission medications   Medication Sig Start Date End Date Taking? Authorizing Provider  aspirin EC 81 MG tablet Take 81 mg by mouth daily.   Yes Historical Provider, MD  gabapentin (NEURONTIN) 100 MG capsule Take 1 capsule (100 mg total) by mouth at bedtime. 12/22/12  Yes Vickki Hearing, MD  haloperidol (HALDOL) 1 MG tablet Take 1 mg by mouth 3 (three) times daily.   Yes Historical Provider, MD  hydrochlorothiazide (HYDRODIURIL) 25 MG tablet Take 25 mg by mouth daily.   Yes Historical Provider, MD  HYDROcodone-acetaminophen (NORCO/VICODIN) 5-325 MG per tablet Take 1-2 tablets by mouth 2 (two) times daily.   Yes Historical Provider, MD  levETIRAcetam (KEPPRA) 750 MG tablet Take 1 tablet (750 mg total) by mouth 2 (two) times daily. 10/03/12  Yes Rhetta Mura, MD  levothyroxine (SYNTHROID, LEVOTHROID) 112 MCG tablet Take 112 mcg by mouth 2 (two) times daily.    Yes Historical Provider, MD  LORazepam (ATIVAN) 2 MG tablet Take 2 mg by mouth 2 (two) times daily.   Yes Historical Provider, MD  memantine (NAMENDA) 10 MG tablet Take 10 mg by mouth 2 (two) times daily.   Yes Historical Provider, MD  polyethylene glycol (MIRALAX / GLYCOLAX) packet Take 17 g by mouth daily. 10/27/12  Yes Joya Gaskins, MD  potassium chloride (K-DUR) 10 MEQ tablet Take 1 tablet (10 mEq total) by mouth 2 (two) times daily. 10/01/12  Yes Milana Obey, MD    Allergies as of  12/22/2012 - Review Complete 12/22/2012  Allergen Reaction Noted  . Aspirin      Family History  Problem Relation Age of Onset  . Arthritis    . Cancer Brother     rectal  . Asthma    . Diabetes      History   Social History  . Marital Status: Married    Spouse Name: N/A    Number of Children: N/A  . Years of Education: college   Occupational History  . Not on file.   Social History Main Topics  . Smoking status: Never Smoker   . Smokeless tobacco: Not on file  . Alcohol Use: No  . Drug Use: No  . Sexually Active: No   Other Topics Concern  . Not on file   Social History Narrative  . No narrative on file    Review of Systems: See HPI, otherwise negative ROS   Physical Exam: BP 143/67  Pulse 77  Temp(Src) 98.8 F (37.1 C) (Oral)  Resp 20  Ht 5\' 10"  (1.778 m)  Wt 187 lb 8 oz (85.049 kg)  BMI 26.9 kg/m2  SpO2 93% General:   Alert,  pleasant and cooperative in NAD Head:  Normocephalic and atraumatic. Neck:  Supple; Lungs:  Clear throughout to auscultation.    Heart:  Regular rate and rhythm. Abdomen:  Soft, nontender and nondistended. Normal bowel sounds, without guarding, and without rebound.   Neurologic:  Alert and  oriented x4;  grossly normal neurologically.  Impression/Plan:     DYSPHAGIA  PLAN:  EGD/?DIL TODAY

## 2012-12-27 NOTE — Op Note (Signed)
[facility] [address] [place], [address]   ENDOSCOPY PROCEDURE REPORT  PATIENT: [name], [name]  MR#: [id number] BIRTHDATE: 1920/10/11 , 89  yrs. old GENDER: Male  ENDOSCOPIST: Jonette Eva, MD REFFERED VH:QIONG Sudie Bailey, M.D.  PROCEDURE DATE:  12/27/2012 PROCEDURE:   EGD with biopsy and with dilatation over guidewire  INDICATIONS:1.  dysphagia. POST-PRANDIAL REGURGITATION MEDICATIONS: Demerol 50 mg IV and Versed 2 mg IV TOPICAL ANESTHETIC: Cetacaine Spray  DESCRIPTION OF PROCEDURE:   After the risks benefits and alternatives of the procedure were thoroughly explained, informed consent was obtained.  The EG-[id number] ([id number])  endoscope was introduced through the mouth and advanced to the second portion of the duodenum. The instrument was slowly withdrawn as the mucosa was carefully examined.  Prior to withdrawal of the scope, the guidwire was placed.  The esophagus was dilated successfully.  The patient was recovered in endoscopy and discharged home in satisfactory condition.   ESOPHAGUS: White exudates consistent with PROBABLE candidiasis were found in the entire esophagus. BRUSH BIOPSIES OBTAINED FOR KOH PREP. A stricture was found at the gastroesophageal junction.  The stenosis was traversable with the endoscope.   A small hiatal hernia was noted.   STOMACH: Moderate erosive gastritis (inflammation) was found in the gastric antrum.  Multiple biopsies were performed using cold forceps.   DUODENUM: Mild duodenal inflammation was found in the duodenal bulb.   The duodenal mucosa showed no abnormalities in the 2nd part of the duodenum.   Dilation was then performed at the gastroesphageal junction Dilator: Savary over guidewire Size(s): 15-16 MM Resistance: moderate Heme: none  COMPLICATIONS: There were no complications.  ENDOSCOPIC IMPRESSION: 1.   PROBABLE ESOPHAGEAL candidiasis 2.   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   Small hiatal  hernia 4.    MODERATE Erosive gastritis & mild duodenitis 5. DYSPHAGIA/REGURGITATION MULTI-FACTORIAL-CNS PROCESS, PROBABLE CANDIDA ESOPHAGITIS, AND ESOPHAGEAL STRICTURE, LESS LIKELY H PYLORI GASTRITIS OR NON-SPECIFIC ESOPHAGEAL MOTILITY DISORDER  RECOMMENDATIONS: BID PPI AWAIT KOH.  DIFLUCAN FOR 21 DAYS PUREED DIET W/ THIN LIQUIDS AWAIT BIOPSY NO ASA, NSAIDS, OR ANTICOAGULATION FOR 5 DAYS. BIL SCDs.      _______________________________ Rosalie DoctorJonette Eva, MD 12/27/2012 10:17 AM      PATIENT NAME:  [name], [name] MR#: [id number]

## 2012-12-27 NOTE — Progress Notes (Signed)
[name], [name]             ACCOUNT NO.:  [id number]  MEDICAL RECORD NO.:  [id number]  LOCATION:  A328                          FACILITY:  [facility]  PHYSICIAN:  Mila Homer. Sudie Bailey, M.D.DATE OF BIRTH:  1920-10-14  DATE OF PROCEDURE: DATE OF DISCHARGE:                                PROGRESS NOTE   SUBJECTIVE:  He is feeling better.  His family tells me that his right arm has been quite weak during the last month, but he has also had weakness in his left arm.  The last few days they noted a droop in the right side of the face, which seems to have gotten better.  He seems to be eating somewhat better.  Yesterday a repeat bladder scan after he fully emptied his urine showed over 500 mL of urine, so his Foley catheter was replaced.  OBJECTIVE:  VITAL SIGNS:  Temperature is 98.4, pulse 71, respiratory rate 18, blood pressure 139/64. GENERAL:  He is semi-recumbent in bed.  He is eating pureed food, fed to him by his daughter.  He appears to be swallowing without coughing. HEART:  His heart has a regular rhythm, rate of 70. LUNGS:  Clear throughout.  He is moving air well. ABDOMEN:  Soft.  DIAGNOSTIC STUDIES:  He did have an EGD by Dr. Darrick Penna, gastroenterologist, yesterday.  He was found to have an esophageal stricture at the GE junction, which was dilated.  He also had white exudates in the esophagus, felt to be consistent with candidiasis.  A KOH prep did show a few yeast.  He also had erosive gastritis.  His recent abdominal ultrasound showed question dilation of the right renal pelvis and definite dilation of the left renal pelvis, consistent with some right hydronephrosis, and this was done before he had a Foley catheter.  ASSESSMENT: 1. Urinary retention secondary to regrowth of some of the prostate     adenoma. 2. Hypokalemia, stable. 3. Generalized weakness of multifactorial origin, which may be     secondary to a cerebrovascular accident, malnutrition, and  inanition. 4. Acute renal insufficiency, which is cleared. 5. Diarrhea, question etiology, but with a negative C difficile. 6. Reflux symptoms at night, but not during the day, now hopefully     treated with esophageal dilation.  PLAN:  He will have an MR of the brain to see if he has had a recent stroke.  He will continue with the Foley catheter and will probably need another TURP.  He is now on fluconazole 100 mg IV daily, and on pantoprazole 40 mg a.c. b.i.d.  He continues with IV fluids.  I have discussed all this with the patient, his wife, his daughter and her husband, and other family members.  He will need more time to get his strength back, with physical therapy and possible short-term nursing home placement.     Mila Homer. Sudie Bailey, M.D.     SDK/MEDQ  D:  12/27/2012  T:  12/27/2012  Job:  [id number]

## 2012-12-27 NOTE — OR Nursing (Signed)
Gave 2mg  Versed. Wasted 3mg  Versed with Laruth Bouchard, EOT.

## 2012-12-27 NOTE — Progress Notes (Signed)
[name], [name]             ACCOUNT NO.:  [id number]  MEDICAL RECORD NO.:  [id number]  LOCATION:  A328                          FACILITY:  [facility]  PHYSICIAN:  Mila Homer. Sudie Bailey, M.D.DATE OF BIRTH:  June 01, 1921  DATE OF PROCEDURE: DATE OF DISCHARGE:                                PROGRESS NOTE   SUBJECTIVE:  He is feeling somewhat better today.  He has been eating, but the family tells me he is having diarrhea 5 or 6 times a day, and has a bowel movement almost immediately after he has his pureed diet.  They also tell me that every night he needs to be suctioned, but he does not need to be suctioned during the day.  He did have his cystoscopy through Dr. Jerre Simon yesterday, and this showed regrowth of some of the adenoma.  His Foley catheter is currently out, and he was able to urinate last night without pain, but apparently he is having some pain with urination today.  He continues to be very, very weak and has not gotten out of bed, although Physical Therapy is seeing him.  OBJECTIVE:  VITAL SIGNS:  Temperature is 98.5, pulse 84, respiratory rate 20, blood pressure 123/65. GENERAL:  He is sitting up in bed.  Looks somewhat weak, but his speech is normal. HEART:  His heart has a regular rhythm.  Rate of 80. LUNGS:  His lungs appear to be clear throughout, and he is moving air well. GU:  His Foley is out.  LABORATORY DATA:  Today potassium level is 3.7, BUN is 16, with a creatinine 1.14.  His hemoglobin is 9.1.  ASSESSMENT: 1. Urinary retention, secondary to regrowth of his prostate adenoma. 2. Hypokalemia, stable. 3. Generalized weakness. 4. Acute renal insufficiency, cleared. 5. Diarrhea. 6. Reflux symptoms at night.  PLAN:  We will have GI check out the reflux, and I also want to make sure he has good gastric emptying.  A C difficile of the stool is pending.  Meanwhile, Physical Therapy will be working with him, and I have asked nursing to get a bladder scan  after he moves his urine to make sure he really has minimal residual.  He may need another TURP.  I reviewed the note from Dr. Jerre Simon and talked to the patient and his daughter in the room and his wife on the phone.     Mila Homer. Sudie Bailey, M.D.     SDK/MEDQ  D:  12/26/2012  T:  12/27/2012  Job:  [id number]

## 2012-12-28 NOTE — Progress Notes (Signed)
Subjective: Since I last evaluated the patient HE HAD ONE BM LAST NIGHT. ABLE TO TOLERATE POS BETTER.  Objective: Vital signs in last 24 hours: Temp:  [97.8 F (36.6 C)-98.8 F (37.1 C)] 98.8 F (37.1 C) (07/27 0606) Pulse Rate:  [66-87] 73 (07/27 0606) Resp:  [8-20] 20 (07/27 0606) BP: (116-156)/(54-80) 156/74 mmHg (07/27 0606) SpO2:  [95 %-100 %] 98 % (07/27 0606) Last BM Date: 12/27/12  Intake/Output from previous day: 07/26 0701 - 07/27 0700 In: 1206.7 [I.V.:1206.7] Out: 450 [Urine:450] Intake/Output this shift:    General appearance: alert, cooperative and appears stated age Resp: clear to auscultation bilaterally Cardio: regular rate and rhythm GI: soft, non-tender; bowel sounds normal;   Lab Results:  Recent Labs  12/26/12 0511  WBC 9.3  HGB 9.1*  HCT 27.5*  PLT 171   BMET  Recent Labs  12/26/12 0511  NA 142  K 3.7  CL 109  CO2 28  GLUCOSE 100*  BUN 16  CREATININE 1.14  CALCIUM 8.8   LFT No results found for this basename: PROT, ALBUMIN, AST, ALT, ALKPHOS, BILITOT, BILIDIR, IBILI,  in the last 72 hours PT/INR No results found for this basename: LABPROT, INR,  in the last 72 hours Hepatitis Panel  Recent Labs  12/27/12 0545  HEPBSAG NEGATIVE  HCVAB NEGATIVE   C-Diff No results found for this basename: CDIFFTOX,  in the last 72 hours Fecal Lactopherrin No results found for this basename: FECLLACTOFRN,  in the last 72 hours  Studies/Results: No results found.  Medications: I have reviewed the patient's current medications.  Assessment/Plan: ADMITTED WITH DYSPAHGIA. S/P EGD/DIL AND STARTED RX FOR CANDIDA ESOPHAGITIS.  PLAN: 1. DIFLUCAN FOR 21 DAYS 2. BID PPI 3. AWAIT BIOPSY 4. PUREED DIET  5. IMODIUM QAC/HS   LOS: 6 days   Satomi Buda 12/28/2012, 9:44 AM

## 2012-12-29 ENCOUNTER — Inpatient Hospital Stay (HOSPITAL_COMMUNITY): Payer: Medicare Other

## 2012-12-29 ENCOUNTER — Encounter (HOSPITAL_COMMUNITY): Payer: Self-pay | Admitting: Radiology

## 2012-12-29 DIAGNOSIS — R197 Diarrhea, unspecified: Secondary | ICD-10-CM

## 2012-12-29 DIAGNOSIS — R1319 Other dysphagia: Secondary | ICD-10-CM

## 2012-12-29 DIAGNOSIS — B3781 Candidal esophagitis: Secondary | ICD-10-CM

## 2012-12-29 NOTE — Progress Notes (Signed)
[name], GATT             ACCOUNT NO.:  [id number]  MEDICAL RECORD NO.:  [id number]  LOCATION:                                 FACILITY:  PHYSICIAN:  Mila Homer. Sudie Bailey, M.D.DATE OF BIRTH:  [birth year]  DATE OF PROCEDURE:  12/28/2012 DATE OF DISCHARGE:                                PROGRESS NOTE   SUBJECTIVE:  He is feeling a good deal better today.  He was able to eat his pureed breakfast this morning without any coughing or choking and actually felt somewhat hungry before this.  His wife says he seems to be stronger and that he is able to sit up on his own.  OBJECTIVE:  VITAL SIGNS:  Temperature is 98.8, pulse 73, respiratory rate 20, blood pressure 150/74.  He is semi-recumbent in bed.  He is oriented and alert.  His wife is in the room with him. HEART:  A regular rhythm.  Rate of 70. LUNGS:  Appear to be clear throughout. He has no edema of the ankles.  ASSESSMENT: 1. Urinary retention secondary to regrowth of a prostate adenoma. 2. Generalized weakness of multifactorial origin, now gradually     improving. 3. Acute renal insufficiency, cleared. 4. Hypokalemia, cleared. 5. Diarrhea, much improved on Imodium A-D. 6. Reflux, improved.  PLAN:  We will continue the current regimen.  He will have official physical therapy tomorrow. Plan will be when he is strong enough to send him home.  The family does not want him to be in a skilled nursing facility.  I will try him on a soft solid diet today.     Mila Homer. Sudie Bailey, M.D.     SDK/MEDQ  D:  12/28/2012  T:  12/28/2012  Job:  [id number]

## 2012-12-29 NOTE — Progress Notes (Signed)
Subjective: Wife at bedside. Denies N/V, abdominal pain, or further loose stool. Tolerating diet. Dysphagia improved.   Objective: Vital signs in last 24 hours: Temp:  [98.9 F (37.2 C)-99.7 F (37.6 C)] 99.7 F (37.6 C) (07/28 0432) Pulse Rate:  [74-83] 74 (07/28 0432) Resp:  [20] 20 (07/28 0432) BP: (134-148)/(64-72) 147/64 mmHg (07/28 0432) SpO2:  [97 %-99 %] 98 % (07/28 0432) Weight:  [187 lb (84.823 kg)] 187 lb (84.823 kg) (07/28 0432) Last BM Date: 12/27/12 General:   Alert and pleaseant Head:  Normocephalic and atraumatic. Eyes:  No icterus, sclera clear. Conjuctiva pink.  Heart:  S1, S2 present Lungs: Clear to auscultation bilaterally, without wheezing, rales, or rhonchi.  Abdomen:  Bowel sounds present, soft, non-tender, non-distended.  Intake/Output from previous day: 07/27 0701 - 07/28 0700 In: 1898.8 [P.O.:668; I.V.:1180.8; IV Piggyback:50] Out: 3751 [Urine:3750; Stool:1] Intake/Output this shift:    Hepatitis Panel  Recent Labs  12/27/12 0545  HEPBSAG NEGATIVE  HCVAB NEGATIVE     Assessment: 79 year old male admitted with dysphagia, s/p EGD on 7/26 with stricture at the GE junction s/p dilation, small hiatal hernia, and candida esophagitis. Erosive gastritis and mild duodenitis noted, pending final path. Improved clinically on Diflucan.   Elevated transaminases with negative viral markers, normal ferritin, low iron. [place] with normal CBD and liver. If persistent LFTs, pursue outpatient work-up.  Diarrhea: negative Cdiff, resolved and doing well on Imodium.     Plan: Diflucan for total of 21 days BID PPI Follow-up on final pathology  Outpatient follow-up of LFTs Follow peripherally, hopeful d/c soon  Nira Retort, ANP-BC [facility]  9:15 AM    LOS: 7 days    12/29/2012, 7:49 AM

## 2012-12-29 NOTE — Progress Notes (Signed)
Physical Therapy Treatment Patient Details Name: [name] MRN: [id number] DOB: [birth year] Today's Date: 12/29/2012 Time: 7253-6644 PT Time Calculation (min): 28 min  PT Assessment / Plan / Recommendation   History of Present Illness  Pt admitted with renal failure.  Pt has progressed slightly with therapy.  Therapist still strongly recommends SNF at discharge but family wants to take pt home.   Clinical Impression Decreased strength, balance and functional mobility.  Pt is at a high risk for falling   PT Comments   Pt states he can not move Rt leg without assist  Follow Up Recommendations  SNF     Does the patient have the potential to tolerate intense rehabilitation   no  Barriers to Discharge  wife has had rotator cuff surgery and will not be able to lift pt      Equipment Recommendations    none   Recommendations for Other Services  OT  Frequency Min 3X/week   Progress towards PT Goals    Plan Current plan remains appropriate    Precautions / Restrictions Precautions Precautions: Fall Restrictions Weight Bearing Restrictions: No   Pertinent Vitals/Pain None stated    Mobility  Bed Mobility Rolling Right: 3: Mod assist Rolling Left: 3: Mod assist Left Sidelying to Sit: 2: Max assist Sitting - Scoot to Edge of Bed: 2: Max assist Transfers Transfers: Sit to [employer] Sit to Stand: 2: Max Multimedia programmer Transfers: 2: Max assist Ambulation/Gait Ambulation/Gait Assistance: Not tested (comment)    Exercises General Exercises - Lower Extremity Ankle Circles/Pumps: Both;10 reps Gluteal Sets: Both;10 reps Short Arc Quad: Both;10 reps Heel Slides: Both;10 reps Hip ABduction/ADduction: Both;10 reps;AROM   PT Diagnosis:   weakness, decreased activity tolerance PT Problem List:  difficulty walking PT Treatment Interventions:  there ex, there activity, gt as able   PT Goals (current goals can now be found in the care plan section)     Visit Information  Last PT Received On: 12/29/12    Subjective Data   as above   Cognition  Cognition Arousal/Alertness: Awake/alert Behavior During Therapy: Flat affect    Balance   2/5  End of Session PT - End of Session Equipment Utilized During Treatment: Gait belt Activity Tolerance: Patient tolerated treatment well Patient left: in chair;with call bell/phone within reach;with family/visitor present Nurse Communication: Mobility status   GP     [name],CINDY 12/29/2012, 4:26 PM

## 2012-12-29 NOTE — Progress Notes (Addendum)
REVIEWED. AGREE. STILL HAVING WATERY STOOL, BUT LESS NUMEROUS.  SEND STOOL FOR GIARDIA.

## 2012-12-29 NOTE — Progress Notes (Signed)
[name], [name]             ACCOUNT NO.:  [id number]  MEDICAL RECORD NO.:  [id number]  LOCATION:  A328                          FACILITY:  [facility]  PHYSICIAN:  Mila Homer. Sudie Bailey, M.D.DATE OF BIRTH:  [birth year]  DATE OF PROCEDURE: DATE OF DISCHARGE:                                PROGRESS NOTE   SUBJECTIVE:  He is generally doing better.  His wife tells me he tolerated the soft solid diet last night.  His diarrhea has cleared.  OBJECTIVE:  VITAL SIGNS:  Temperature is 99.7, pulse 74, respiratory rate 20, blood pressure 147/64. GENERAL:  He is supine in bed.  He is in no acute distress.  His speech appears to be normal. LUNGS:  Clear throughout. HEART:  Regular rhythm, rate of 70. EXTREMITIES:  No edema of the ankles.  ASSESSMENT: 1. Urinary retention secondary to regrowth of a prostate adenoma. 2. Generalized weakness, improved. 3. Diarrhea, improved. 4. Question cerebrovascular accident as a cause of his weakness and     other symptoms over the last month.  PLAN:  He is due for an MRI of the brain this morning.  We will continue advancing his diet.  Will discharge him home hopefully in the next few days, if he is at least able to do transfer from bed to walker.  He will need a catheter in until he has a surgical procedure.     Mila Homer. Sudie Bailey, M.D.     SDK/MEDQ  D:  12/29/2012  T:  12/29/2012  Job:  [id number]

## 2012-12-29 NOTE — Progress Notes (Addendum)
Nutrition Follow-up   INTERVENTION:  Continue Ensure Complete po BID, each supplement provides 350 kcal and 13 grams of protein.  NUTRITION DIAGNOSIS: Malnutriton related to inadequate oral intake; progressing  Goal: Pt to meet >/= 90% of their estimated nutrition needs; improving    Monitor:  Diet advancement / tolerance, Po intake, labs and wt trends  79 y.o. male  Admitting Dx: ARF (acute renal failure), urinary retention  ASSESSMENT: Pt reports improved appetite and po meal intake. He is enjoying his meal more now that his diet has advanced from pureed foods. Will continue with current plan and encourage meal supplement intake.   Height: Ht Readings from Last 1 Encounters:  12/22/12 5\' 10"  (1.778 m)    Weight Status: Wt Readings from Last 1 Encounters:  12/29/12 187 lb (84.823 kg)  Admit wt 166#   Wt Readings from Last 10 Encounters:  12/29/12 187 lb (84.823 kg)  12/29/12 187 lb (84.823 kg)  12/29/12 187 lb (84.823 kg)  12/29/12 187 lb (84.823 kg)  12/29/12 187 lb (84.823 kg)  10/03/12 198 lb 10.2 oz (90.1 kg)  09/29/12 191 lb 6.4 oz (86.818 kg)  09/17/12 195 lb (88.451 kg)  03/05/12 197 lb 1.5 oz (89.4 kg)  10/16/11 200 lb (90.719 kg)    Usual Body Weight: 190#  % Usual Body Weight: 87%  BMI:  Body mass index is 26.83 kg/(m^2).normal range  Estimated Nutritional Needs: Kcal: 7846-9629 Protein: 80-90 gr Fluid: 2200 ml/day  Skin: No issues noted  Diet Order: Dysphagia 2 thin liquids  EDUCATION NEEDS: -Education needs addressed   Intake/Output Summary (Last 24 hours) at 12/29/12 1246 Last data filed at 12/29/12 0900  Gross per 24 hour  Intake 2018.83 ml  Output   4051 ml  Net -2032.17 ml    Last BM: 12/27/12  Labs:   Recent Labs Lab 12/24/12 0625 12/25/12 0608 12/26/12 0511  NA 142 143 142  K 3.0* 3.7 3.7  CL 105 108 109  CO2 31 29 28   BUN 23 20 16   CREATININE 1.43* 1.21 1.14  CALCIUM 8.9 9.0 8.8  GLUCOSE 118* 115* 100*     CBG (last 3)  No results found for this basename: GLUCAP,  in the last 72 hours  Scheduled Meds: . fluconazole (DIFLUCAN) IV  100 mg Intravenous Q24H  . gabapentin  100 mg Oral QHS  . levETIRAcetam  750 mg Oral BID  . levothyroxine  112 mcg Oral BID  . loperamide  2 mg Oral TID AC & HS  . LORazepam  0.5 mg Oral BID  . memantine  10 mg Oral BID  . pantoprazole sodium  40 mg Oral BID AC  . potassium chloride  10 mEq Oral BID  . sodium chloride  3 mL Intravenous Q12H    Continuous Infusions: . sodium chloride 50 mL/hr at 12/29/12 5284    Past Medical History  Diagnosis Date  . HTN (hypertension)   . Thyroid disease   . Stroke     TIA  . Cancer     prostate  . Seizures   . Dementia     Past Surgical History  Procedure Laterality Date  . Hernia repair      x 2  . Prostate surgery    . Back surgery      cervical spine  . Colonoscopy      at [facility], polyp per wife    Royann Shivers MS,RD,LDN,CSG Office: #[phone] Pager: [phone]

## 2012-12-30 MED ORDER — LOPERAMIDE HCL 2 MG PO CAPS: 2.0000 mg | ORAL_CAPSULE | Freq: Three times a day (TID) | ORAL | Status: DC

## 2012-12-30 MED ORDER — LORAZEPAM 0.5 MG PO TABS: 0.5000 mg | ORAL_TABLET | Freq: Two times a day (BID) | ORAL | Status: AC | PRN

## 2012-12-30 MED ORDER — FLUCONAZOLE 100 MG PO TABS: 100.0000 mg | ORAL_TABLET | Freq: Every day | ORAL | Status: DC

## 2012-12-30 NOTE — Progress Notes (Signed)
Patient discharged today with instructions given on medications and follow visits, patient,and family  verbalized understanding. Medications to be picked up  from pharmacy documented on AVS. No c/o pain or discomfort noted. Accompanied by staff to an awaiting vehicle.

## 2012-12-31 LAB — GIARDIA/CRYPTOSPORIDIUM SCREEN(EIA)
Cryptosporidium Screen (EIA): NEGATIVE
Giardia Screen - EIA: NEGATIVE

## 2012-12-31 NOTE — Discharge Summary (Signed)
[name], [name]             ACCOUNT NO.:  [id number]  MEDICAL RECORD NO.:  [id number]  LOCATION:  A328                          FACILITY:  [facility]  PHYSICIAN:  Mila Homer. Sudie Bailey, M.D.DATE OF BIRTH:  Oct 17, 1920  DATE OF ADMISSION:  12/22/2012 DATE OF DISCHARGE:  07/29/2014LH                              DISCHARGE SUMMARY   This 79 year old presented to the hospital with generalized weakness. The patient had a 9-day hospitalization.  During this time, it was discovered he was in acute urinary retention. When he initially came in the hospital, he had 800 mL of urine drained up with a Foley catheter, and several days later, a voiding trial was attempted and after voiding still had about 500 mL left.  During his hospitalization, he was seen by Dr. Jerre Simon, urologist, who did a cystoscopy on him showing that he had prostatic hypertrophy preventing complete emptying of his bladder.  He also had evidence of some hydronephrosis secondary to this, but did well with the catheter and was ready to be discharged home with the catheter.  In addition, he was having swallowing difficulties.  During his hospitalization, he was seen by Dr. Darrick Penna, gastroenterologist, who found that he did have esophageal stricture at the GE junction.  He was also found to have candidal esophagitis.  This was treated with Diflucan b.i.d. and PPIs.  He gradually had been going downhill for at least a month prior to hospitalization and had not been eating or drinking well, but this was much improved in the hospital.  Initially, he was on a pureed diet and then on a finely chopped diet, which he was tolerating well with help from the family.  His blood work on admission, white cell count was 10,200, hemoglobin 10.5, and several days before discharge white cell count 9300, hemoglobin 9.1.  Initially, he had a BUN of 32 and creatinine 2.12, but after rehydration, his BUN was 16 and creatinine 1.14, was felt to  be normal.  His total bilirubin on admission was 1.5, which dropped to 1.3 in short order.  Iron studies showed an iron level of 33, total iron binding capacity 148.  Saturation 22%, ferritin 194, and red cell folate 663, and B12 was 360.  D-dimer was 3.47.  TSH 4.187, glucose is in the low 100 range.  His PSA was 2.60.  Hepatitis B surface antigen was negative as was hepatitis C virus antibody.  Fungal test of his esophagus did show a few yeast and KOH prep.  UA was negative except for specific gravity greater than 1.030 on admission consistent with his dehydration.  Stool was negative for C. difficile by PCR.  He was having frequent bowel movements initially, but these cleared with the use of Imodium.  His admission chest x-ray showed no acute cardiopulmonary disease.  MRI of the brain without contrast showed atrophy and chronic microvascular ischemia and mild chronic microvascular hemorrhage, which is unchanged. He has spinal stenosis at C1 due to transverse ligament laxity and soft tissue hypertrophy.  This MRI was compared to his MRI done on Oct 02, 2012, and was really unchanged from that one.  His abdominal ultrasound showed the right kidney was 8.6 cm in length.  There was dilatation of the renal pelvis.  The left kidney length was 9.4 cm.  There was felt to be a probable right hydronephrosis and questionable left pelvic dilatation. His gallbladder and liver were normal.  He also had ultrasound of his bilateral extremities.  He had what appeared to be a Baker's cyst in the right popliteal fossa and edema in the lower extremities.  A 12-lead EKG showed normal sinus rhythms.  He was treated with IV fluids for rehydration.  A number of meds he had been using at home were discontinued including hydrocodone, 2 mg lorazepam, haloperidol.  As noted above, gradually we were able to improve his diet after having esophageal dilation.  His yeast esophagitis was treated.  FINAL  DISCHARGE DIAGNOSES: 1. Generalized weakness of multifactorial origin. 2. Acute urinary retention secondary to benign prostatic hypertrophy. 3. Hydronephrosis. 4. Esophageal stricture. 5. Candidal esophagitis. 6. Edema. 7. Hypothyroidism. 8. Hypertension.  DISCHARGE MEDICATIONS:  He is discharged home on: 1. Aspirin 81 mg daily. 2. Gabapentin 100 mg at bedtime. 3. Keppra 750 mg b.i.d. 4. Levothyroxine 112 mcg daily. 5. Namenda 10 mg b.i.d. 6. Potassium chloride 10 mEq b.i.d. 7. Fluconazole 100 mg daily (50 with no refills). 8. Loperamide 2 mg up to q.i.d. (60 with refills). 9. Lorazepam 0.5 mg b.i.d. (60 with refills).  Haloperidol, hydrochlorothiazide, and hydrocodone/APAP were discontinued as was lorazepam 2 mg and polyethylene glycol.  Arrangements were made for the patient to have home health.     Mila Homer. Sudie Bailey, M.D.    SDK/MEDQ  D:  12/30/2012  T:  12/31/2012  Job:  [id number]

## 2013-01-02 ENCOUNTER — Telehealth: Payer: Self-pay | Admitting: Gastroenterology

## 2013-01-02 MED ORDER — OMEPRAZOLE 20 MG PO CPDR: DELAYED_RELEASE_CAPSULE | ORAL | Status: DC

## 2013-01-02 NOTE — Telephone Encounter (Signed)
I called and informed pt's wife. She said that he is still having some difficulty swallowing. She started giving him some soft foods instead of pureed. I told her to stick to the pureed for now and I will let Dr. Darrick Penna know her concern.

## 2013-01-02 NOTE — Consult Note (Signed)
[name], [name]             ACCOUNT NO.:  [id number]  MEDICAL RECORD NO.:  [id number]  LOCATION:                                 FACILITY:  PHYSICIAN:  Ky Barban, M.D.DATE OF BIRTH:  03/15/2011  DATE OF CONSULTATION:  12/24/2012 DATE OF DISCHARGE:  12/30/2012                                CONSULTATION   CHIEF COMPLAINT:  Acute urinary retention.  HISTORY OF PRESENT ILLNESS:  This is a elderly patient, who is very poor historian.  The family also did not tell me much about him.  According to the family he was walking around doing okay.  All of a sudden, he was unable to void and brought him to the emergency room where Foley catheter was inserted and several 100 mL of urine was recovered.  Now, he has a Foley catheter.  They told me that he was going to bathroom making small amount of urine every time.  He was going frequently during the day and during the night.  No fever or chills.  They told me he has no other medical problem, but when I reviewed the record, he is the patient who had refused TUR prostate.  He had a bladder neck obstruction.  He has prostate cancer and has been treated for that with TUR prostate for symptoms of prostatism and he also has problem of hypertension.  PHYSICAL EXAMINATION:  GENERAL:  Moderately built male, not in acute distress, fully conscious, alert, oriented. VITAL SIGNS:  Blood pressure 130/80, temperature is normal. CENTRAL NERVOUS SYSTEM:  No gross neurological deficit. ABDOMEN:  Soft, flat.  Liver, spleen, kidneys not palpable.  Foley catheter is in place. GU:  Testicles are normal.  RECTAL:  Deferred. EXTREMITIES:  Normal.  IMPRESSION:  Acute urinary retention and prostate cancer.  RECOMMENDATION:  Cystoscopy under local anesthesia and with flexible cystoscope.     Ky Barban, M.D.     MIJ/MEDQ  D:  01/01/2013  T:  01/02/2013  Job:  [id number]

## 2013-01-02 NOTE — Telephone Encounter (Addendum)
Please call pt. His stomach Bx shows mild gastritis FROM ASA. ADD OMEPRAZOLE EVERY AM. COMPLETE DIFLUCAN. LAST DOSE  AUG 9.

## 2013-01-06 ENCOUNTER — Other Ambulatory Visit: Payer: Self-pay | Admitting: Gastroenterology

## 2013-01-06 DIAGNOSIS — R1310 Dysphagia, unspecified: Secondary | ICD-10-CM

## 2013-01-06 NOTE — Telephone Encounter (Signed)
REVIEWED.  

## 2013-01-06 NOTE — Telephone Encounter (Signed)
Referral has been made to Speech Pathology

## 2013-01-06 NOTE — Telephone Encounter (Signed)
Called and spoke to pt's wife. She said she does not feel like he can advance his diet now. She is giving him pureed and he is still holding it in his mouth and taking forever to swallow. Please advise!

## 2013-01-06 NOTE — Telephone Encounter (Signed)
PLEASE CALL PT. She should only increase his diet if his swallowing is re-assessed by Speech Pathology. Complete Diflucan and schedule evaluation by Speech next week, dx: dysphagia.

## 2013-01-07 NOTE — Telephone Encounter (Signed)
Pt's wife aware that referral was made.

## 2013-01-14 ENCOUNTER — Telehealth: Payer: Self-pay | Admitting: *Deleted

## 2013-01-14 NOTE — Telephone Encounter (Signed)
REVIEWED.  

## 2013-01-14 NOTE — Telephone Encounter (Signed)
Pt's wife called stating pt has been vomiting and having trouble swallowing, please advise [phone]

## 2013-01-14 NOTE — Telephone Encounter (Signed)
REVIEWED. AGREE. 

## 2013-01-14 NOTE — Telephone Encounter (Signed)
Mandy from Speech was off last week an noone was filling in to do referrals so she is calling Mr. Heesch now to schedule

## 2013-01-14 NOTE — Telephone Encounter (Signed)
Spoke with pts wife- she and the CNA were feeding the pt breakfast this morning and he kept telling them that he couldn't swallow it. As they were encouraging him to swallow, he started vomiting. Today was the first time he has done that. They were feeding him eggs, bacon and toast. He is not having any problems swallowing liquids. They have not heard anything from speech therapy yet. Advised her that I would ask LW to check on referral to speech therapy and in the meantime I asked her to keep him on liquids and purees and no more solid foods for now. She wants to know if he can be seen by speech asap?  [name], will you please check on referral for speech therapy? Thanks.

## 2013-01-15 ENCOUNTER — Telehealth: Payer: Self-pay | Admitting: *Deleted

## 2013-01-15 NOTE — Telephone Encounter (Signed)
PLEASE CALL PT's wife. Pt should continue a pureed diet until speech therapy says he can advance his diet. I do not have anything that will make his swallowing better.

## 2013-01-15 NOTE — Telephone Encounter (Signed)
I called and spoke to pt's wife, Larita Fife. She said he is doing some better now. He has drank most of an Ensure. He got his pills down, one by one with a sip of water. The therapist came out yesterday. She had him stick his tongue out and checked under his neck. She just said that she would be back next week, but they don't know what her plans are.  Mrs. Errington is just concerned about him having these episodes and wants to know if Dr. Darrick Penna has any recommendations.

## 2013-01-15 NOTE — Telephone Encounter (Signed)
Pt's wife called stating pt is throwing up and can't keep medicine down, states pt needs to keep medicine down because he has seizures. Please advise [phone]

## 2013-01-15 NOTE — Telephone Encounter (Signed)
Called and informed pt's wife. She said he is still vomiting. Per Dr. Darrick Penna, He will need to go to the ED. Pt's wife aware.

## 2013-01-30 ENCOUNTER — Other Ambulatory Visit: Payer: Self-pay | Admitting: Internal Medicine

## 2013-02-05 ENCOUNTER — Other Ambulatory Visit: Payer: Self-pay | Admitting: Internal Medicine

## 2013-02-05 DIAGNOSIS — R1310 Dysphagia, unspecified: Secondary | ICD-10-CM

## 2013-02-09 ENCOUNTER — Other Ambulatory Visit (HOSPITAL_COMMUNITY): Payer: Medicare Other

## 2013-02-09 ENCOUNTER — Encounter (HOSPITAL_COMMUNITY): Payer: Medicare Other | Admitting: Speech Pathology

## 2013-08-18 ENCOUNTER — Other Ambulatory Visit: Payer: Self-pay

## 2013-08-18 MED ORDER — OMEPRAZOLE 20 MG PO CPDR: DELAYED_RELEASE_CAPSULE | ORAL | Status: DC

## 2013-11-13 ENCOUNTER — Ambulatory Visit (HOSPITAL_COMMUNITY)
Admission: RE | Admit: 2013-11-13 | Discharge: 2013-11-13 | Disposition: A | Discharge status: Home / Self Care | Payer: 59 | Source: Ambulatory Visit | Attending: Family Medicine | Admitting: Family Medicine

## 2013-11-13 ENCOUNTER — Other Ambulatory Visit (HOSPITAL_COMMUNITY): Payer: Self-pay | Admitting: Family Medicine

## 2013-11-13 DIAGNOSIS — M1610 Unilateral primary osteoarthritis, unspecified hip: Secondary | ICD-10-CM | POA: Insufficient documentation

## 2013-11-13 DIAGNOSIS — M169 Osteoarthritis of hip, unspecified: Secondary | ICD-10-CM | POA: Insufficient documentation

## 2013-11-13 DIAGNOSIS — M1990 Unspecified osteoarthritis, unspecified site: Secondary | ICD-10-CM

## 2013-11-13 DIAGNOSIS — M25469 Effusion, unspecified knee: Secondary | ICD-10-CM | POA: Insufficient documentation

## 2013-11-13 DIAGNOSIS — M25569 Pain in unspecified knee: Secondary | ICD-10-CM | POA: Insufficient documentation

## 2014-01-11 ENCOUNTER — Emergency Department (HOSPITAL_COMMUNITY)
Admission: EM | Admit: 2014-01-11 | Discharge: 2014-01-11 | Disposition: A | Discharge status: Home / Self Care | Payer: 59 | Attending: Emergency Medicine | Admitting: Emergency Medicine

## 2014-01-11 ENCOUNTER — Emergency Department (HOSPITAL_COMMUNITY): Payer: 59

## 2014-01-11 ENCOUNTER — Encounter (HOSPITAL_COMMUNITY): Payer: Self-pay | Admitting: Emergency Medicine

## 2014-01-11 DIAGNOSIS — Z7982 Long term (current) use of aspirin: Secondary | ICD-10-CM | POA: Diagnosis not present

## 2014-01-11 DIAGNOSIS — Z79899 Other long term (current) drug therapy: Secondary | ICD-10-CM | POA: Insufficient documentation

## 2014-01-11 DIAGNOSIS — Z8546 Personal history of malignant neoplasm of prostate: Secondary | ICD-10-CM | POA: Insufficient documentation

## 2014-01-11 DIAGNOSIS — Z8673 Personal history of transient ischemic attack (TIA), and cerebral infarction without residual deficits: Secondary | ICD-10-CM | POA: Diagnosis not present

## 2014-01-11 DIAGNOSIS — E079 Disorder of thyroid, unspecified: Secondary | ICD-10-CM | POA: Insufficient documentation

## 2014-01-11 DIAGNOSIS — J209 Acute bronchitis, unspecified: Secondary | ICD-10-CM | POA: Insufficient documentation

## 2014-01-11 DIAGNOSIS — J40 Bronchitis, not specified as acute or chronic: Secondary | ICD-10-CM

## 2014-01-11 DIAGNOSIS — I10 Essential (primary) hypertension: Secondary | ICD-10-CM | POA: Diagnosis not present

## 2014-01-11 DIAGNOSIS — R059 Cough, unspecified: Secondary | ICD-10-CM | POA: Diagnosis present

## 2014-01-11 DIAGNOSIS — Z792 Long term (current) use of antibiotics: Secondary | ICD-10-CM | POA: Insufficient documentation

## 2014-01-11 DIAGNOSIS — R05 Cough: Secondary | ICD-10-CM | POA: Insufficient documentation

## 2014-01-11 DIAGNOSIS — G40909 Epilepsy, unspecified, not intractable, without status epilepticus: Secondary | ICD-10-CM | POA: Diagnosis not present

## 2014-01-11 DIAGNOSIS — F0390 Unspecified dementia without behavioral disturbance: Secondary | ICD-10-CM | POA: Insufficient documentation

## 2014-01-11 MED ORDER — AZITHROMYCIN 250 MG PO TABS: ORAL_TABLET | ORAL | Status: DC

## 2014-01-11 NOTE — ED Notes (Signed)
Pt complain of cough since Sunday. Denies other symptoms. States his wife was here Friday and was told she has pneumonia. States he wants to make sure he does not have pneumonia

## 2014-01-11 NOTE — ED Notes (Signed)
Pt with runny nose and cough since yesterday, denies fever or N/V/D

## 2014-01-11 NOTE — ED Provider Notes (Signed)
CSN: [id number]     Arrival date & time 01/11/14  1448 History   First MD Initiated Contact with Patient 01/11/14 1543     Chief Complaint  Patient presents with  . Cough     (Consider location/radiation/quality/duration/timing/severity/associated sxs/prior Treatment) HPI... cough for several days. Wife recently treated for pneumonia. No fever, chills, rusty sputum. Nonsmoker. Patient is ambulatory at home. No substernal chest pain.  No other complaints.  Past Medical History  Diagnosis Date  . HTN (hypertension)   . Thyroid disease   . Stroke     TIA  . Cancer     prostate  . Seizures   . Dementia    Past Surgical History  Procedure Laterality Date  . Hernia repair      x 2  . Prostate surgery    . Back surgery      cervical spine  . Colonoscopy      at [facility], polyp per wife  . Cystoscopy N/A 12/25/2012    Procedure: CYSTOSCOPY FLEXIBLE;  Surgeon: Marissa Nestle, MD;  Location: [facility];  Service: Urology;  Laterality: N/A;  . Esophagogastroduodenoscopy (egd) with esophageal dilation N/A 12/27/2012    Procedure: ESOPHAGOGASTRODUODENOSCOPY (EGD) WITH ESOPHAGEAL DILATION;  Surgeon: Danie Binder, MD;  Location: [facility];  Service: Endoscopy;  Laterality: N/A;   Family History  Problem Relation Age of Onset  . Arthritis    . Cancer Brother     rectal  . Asthma    . Diabetes     History  Substance Use Topics  . Smoking status: Never Smoker   . Smokeless tobacco: Not on file  . Alcohol Use: No    Review of Systems  All other systems reviewed and are negative.     Allergies  Aspirin  Home Medications   Prior to Admission medications   Medication Sig Start Date End Date Taking? Authorizing Provider  aspirin EC 81 MG tablet Take 81 mg by mouth daily.   Yes Historical Provider, MD  amoxicillin (AMOXIL) 500 MG capsule  12/24/13   Historical Provider, MD  azithromycin (ZITHROMAX Z-PAK) 250 MG tablet 2 po day one, then 1 daily x 4 days 01/11/14   Nat Christen,  MD  clopidogrel (PLAVIX) 75 MG tablet Take 75 mg by mouth daily. 12/08/13   Historical Provider, MD  finasteride (PROSCAR) 5 MG tablet Take 5 mg by mouth daily. 12/08/13   Historical Provider, MD  fluconazole (DIFLUCAN) 100 MG tablet Take 1 tablet (100 mg total) by mouth daily. 12/30/12   Robert Bellow, MD  gabapentin (NEURONTIN) 100 MG capsule Take 1 capsule (100 mg total) by mouth at bedtime. 12/22/12   Carole Civil, MD  levETIRAcetam (KEPPRA) 750 MG tablet Take 1 tablet (750 mg total) by mouth 2 (two) times daily. 10/03/12   Nita Sells, MD  levothyroxine (SYNTHROID, LEVOTHROID) 112 MCG tablet Take 112 mcg by mouth 2 (two) times daily.     Historical Provider, MD  loperamide (IMODIUM) 2 MG capsule Take 1 capsule (2 mg total) by mouth 4 (four) times daily -  before meals and at bedtime. 12/30/12   Robert Bellow, MD  LORazepam (ATIVAN) 0.5 MG tablet Take 1 tablet (0.5 mg total) by mouth 2 (two) times daily as needed for anxiety. 12/30/12   Robert Bellow, MD  memantine (NAMENDA) 10 MG tablet Take 10 mg by mouth 2 (two) times daily.    Historical Provider, MD  omeprazole (PRILOSEC) 20 MG capsule 1  po every morning 08/18/13   Orvil Feil, NP  potassium chloride (K-DUR) 10 MEQ tablet Take 1 tablet (10 mEq total) by mouth 2 (two) times daily. 10/01/12   Robert Bellow, MD  tamsulosin (FLOMAX) 0.4 MG CAPS capsule Take 0.4 mg by mouth daily. 12/08/13   Historical Provider, MD   BP 165/88  Pulse 90  Temp(Src) 98.4 F (36.9 C) (Oral)  Resp 18  Ht 5\' 10"  (1.778 m)  Wt 175 lb (79.379 kg)  BMI 25.11 kg/m2  SpO2 100% Physical Exam  Nursing note and vitals reviewed. Constitutional: He is oriented to person, place, and time. He appears well-developed and well-nourished.  HENT:  Head: Normocephalic and atraumatic.  Eyes: Conjunctivae and EOM are normal. Pupils are equal, round, and reactive to light.  Neck: Normal range of motion. Neck supple.  Cardiovascular: Normal rate, regular  rhythm and normal heart sounds.   Pulmonary/Chest: Effort normal and breath sounds normal.  Abdominal: Soft. Bowel sounds are normal.  Musculoskeletal: Normal range of motion.  Neurological: He is alert and oriented to person, place, and time.  Skin: Skin is warm and dry.  Psychiatric: He has a normal mood and affect. His behavior is normal.    ED Course  Procedures (including critical care time) Labs Review Labs Reviewed - No data to display  Imaging Review Dg Chest 2 View  01/11/2014   CLINICAL DATA:  80 year old male with cough  EXAM: CHEST  2 VIEW  COMPARISON:  12/22/2012 and prior chest radiographs  FINDINGS: The cardiomediastinal silhouette is unremarkable.  Mild elevation of the right hemidiaphragm again noted.  There is no evidence of focal airspace disease, pulmonary edema, suspicious pulmonary nodule/mass, pleural effusion, or pneumothorax. No acute bony abnormalities are identified.  IMPRESSION: No active cardiopulmonary disease.   Electronically Signed   By: Hassan Rowan M.D.   On: 01/11/2014 15:27     EKG Interpretation None      MDM   Final diagnoses:  Bronchitis    Patient is elderly and immunocompromised. Although chest x-ray shows pneumonia, we'll start Zithromax. He is hemodynamically stable    Nat Christen, MD 01/11/14 [phone]

## 2014-01-11 NOTE — Discharge Instructions (Signed)
Increase fluids, Tylenol for pain, over-the-counter cough medicine, prescription for antibiotic.

## 2014-05-18 ENCOUNTER — Encounter (HOSPITAL_COMMUNITY): Payer: Self-pay | Admitting: *Deleted

## 2014-05-18 ENCOUNTER — Emergency Department (HOSPITAL_COMMUNITY)
Admission: EM | Admit: 2014-05-18 | Discharge: 2014-05-18 | Disposition: A | Discharge status: Home / Self Care | Payer: Commercial Managed Care - HMO | Attending: Emergency Medicine | Admitting: Emergency Medicine

## 2014-05-18 DIAGNOSIS — I10 Essential (primary) hypertension: Secondary | ICD-10-CM | POA: Diagnosis not present

## 2014-05-18 DIAGNOSIS — Z8546 Personal history of malignant neoplasm of prostate: Secondary | ICD-10-CM | POA: Insufficient documentation

## 2014-05-18 DIAGNOSIS — Z7902 Long term (current) use of antithrombotics/antiplatelets: Secondary | ICD-10-CM | POA: Insufficient documentation

## 2014-05-18 DIAGNOSIS — E079 Disorder of thyroid, unspecified: Secondary | ICD-10-CM | POA: Insufficient documentation

## 2014-05-18 DIAGNOSIS — R112 Nausea with vomiting, unspecified: Secondary | ICD-10-CM | POA: Diagnosis present

## 2014-05-18 DIAGNOSIS — Z8673 Personal history of transient ischemic attack (TIA), and cerebral infarction without residual deficits: Secondary | ICD-10-CM | POA: Insufficient documentation

## 2014-05-18 DIAGNOSIS — F0390 Unspecified dementia without behavioral disturbance: Secondary | ICD-10-CM | POA: Insufficient documentation

## 2014-05-18 DIAGNOSIS — Z7982 Long term (current) use of aspirin: Secondary | ICD-10-CM | POA: Diagnosis not present

## 2014-05-18 DIAGNOSIS — Z792 Long term (current) use of antibiotics: Secondary | ICD-10-CM | POA: Diagnosis not present

## 2014-05-18 DIAGNOSIS — E860 Dehydration: Secondary | ICD-10-CM | POA: Insufficient documentation

## 2014-05-18 LAB — COMPREHENSIVE METABOLIC PANEL
ALBUMIN: 4.5 g/dL (ref 3.5–5.2)
ALK PHOS: 98 U/L (ref 39–117)
ALT: 23 U/L (ref 0–53)
ANION GAP: 15 (ref 5–15)
AST: 27 U/L (ref 0–37)
BILIRUBIN TOTAL: 1.4 mg/dL — AB (ref 0.3–1.2)
BUN: 19 mg/dL (ref 6–23)
CHLORIDE: 102 meq/L (ref 96–112)
CO2: 25 mEq/L (ref 19–32)
Calcium: 9.7 mg/dL (ref 8.4–10.5)
Creatinine, Ser: 1.59 mg/dL — ABNORMAL HIGH (ref 0.50–1.35)
GFR calc non Af Amer: 36 mL/min — ABNORMAL LOW (ref >90)
GFR, EST AFRICAN AMERICAN: 42 mL/min — AB (ref >90)
GLUCOSE: 91 mg/dL (ref 70–99)
POTASSIUM: 4 meq/L (ref 3.7–5.3)
Sodium: 142 mEq/L (ref 137–147)
Total Protein: 8.3 g/dL (ref 6.0–8.3)

## 2014-05-18 LAB — CBC WITH DIFFERENTIAL/PLATELET
BASOS ABS: 0 10*3/uL (ref 0.0–0.1)
BASOS PCT: 0 % (ref 0–1)
EOS ABS: 0.1 10*3/uL (ref 0.0–0.7)
Eosinophils Relative: 1 % (ref 0–5)
HEMATOCRIT: 41.7 % (ref 39.0–52.0)
Hemoglobin: 13.4 g/dL (ref 13.0–17.0)
Lymphocytes Relative: 6 % — ABNORMAL LOW (ref 12–46)
Lymphs Abs: 0.5 10*3/uL — ABNORMAL LOW (ref 0.7–4.0)
MCH: 28.7 pg (ref 26.0–34.0)
MCHC: 32.1 g/dL (ref 30.0–36.0)
MCV: 89.3 fL (ref 78.0–100.0)
MONO ABS: 0.2 10*3/uL (ref 0.1–1.0)
Monocytes Relative: 3 % (ref 3–12)
NEUTROS ABS: 6.8 10*3/uL (ref 1.7–7.7)
Neutrophils Relative %: 89 % — ABNORMAL HIGH (ref 43–77)
Platelets: 114 10*3/uL — ABNORMAL LOW (ref 150–400)
RBC: 4.67 MIL/uL (ref 4.22–5.81)
RDW: 13.9 % (ref 11.5–15.5)
WBC: 7.6 10*3/uL (ref 4.0–10.5)

## 2014-05-18 LAB — URINALYSIS, ROUTINE W REFLEX MICROSCOPIC
BILIRUBIN URINE: NEGATIVE
GLUCOSE, UA: NEGATIVE mg/dL
HGB URINE DIPSTICK: NEGATIVE
KETONES UR: NEGATIVE mg/dL
LEUKOCYTES UA: NEGATIVE
Nitrite: NEGATIVE
PROTEIN: NEGATIVE mg/dL
Specific Gravity, Urine: 1.015 (ref 1.005–1.030)
Urobilinogen, UA: 0.2 mg/dL (ref 0.0–1.0)
pH: 6.5 (ref 5.0–8.0)

## 2014-05-18 LAB — LIPASE, BLOOD: Lipase: 23 U/L (ref 11–59)

## 2014-05-18 LAB — TROPONIN I: Troponin I: <0.3 ng/mL (ref <0.30)

## 2014-05-18 MED ORDER — ONDANSETRON HCL 4 MG/2ML IJ SOLN
4.0000 mg | Freq: Once | INTRAMUSCULAR | Status: AC
  Administered 2014-05-18: 4 mg via INTRAVENOUS
  Filled 2014-05-18: qty 2

## 2014-05-18 MED ORDER — ONDANSETRON HCL 4 MG PO TABS: 4.0000 mg | ORAL_TABLET | Freq: Four times a day (QID) | ORAL | Status: DC

## 2014-05-18 MED ORDER — SODIUM CHLORIDE 0.9 % IV BOLUS (SEPSIS)
500.0000 mL | Freq: Once | INTRAVENOUS | Status: AC
  Administered 2014-05-18: 500 mL via INTRAVENOUS

## 2014-05-18 NOTE — Discharge Instructions (Signed)

## 2014-05-18 NOTE — ED Provider Notes (Signed)
CSN: [id number]     Arrival date & time 05/18/14  1543 History   First MD Initiated Contact with Patient 05/18/14 1601     Chief Complaint  Patient presents with  . Emesis    Patient is a 80 y.o. male presenting with vomiting. The history is provided by the patient.  Emesis Severity:  Moderate Duration: several hours. Timing:  Intermittent Progression:  Worsening Chronicity:  New Relieved by:  None tried Worsened by:  Nothing tried Associated symptoms: no abdominal pain, no cough, no diarrhea, no fever and no headaches   Patient reports vomiting today He reports 3 separate episodes that were nonbloody He denies diarrhea.  No abdominal pain. No fever No cp/sob   He was seen by PCP today but failed to mention it to him for evaluation No new medications    Past Medical History  Diagnosis Date  . HTN (hypertension)   . Thyroid disease   . Stroke     TIA  . Seizures   . Dementia   . Cancer     prostate   Past Surgical History  Procedure Laterality Date  . Hernia repair      x 2  . Prostate surgery    . Back surgery      cervical spine  . Colonoscopy      at [facility], polyp per wife  . Cystoscopy N/A 12/25/2012    Procedure: CYSTOSCOPY FLEXIBLE;  Surgeon: Marissa Nestle, MD;  Location: [facility];  Service: Urology;  Laterality: N/A;  . Esophagogastroduodenoscopy (egd) with esophageal dilation N/A 12/27/2012    Procedure: ESOPHAGOGASTRODUODENOSCOPY (EGD) WITH ESOPHAGEAL DILATION;  Surgeon: Danie Binder, MD;  Location: [facility];  Service: Endoscopy;  Laterality: N/A;   Family History  Problem Relation Age of Onset  . Arthritis    . Cancer Brother     rectal  . Asthma    . Diabetes     History  Substance Use Topics  . Smoking status: Never Smoker   . Smokeless tobacco: Not on file  . Alcohol Use: No    Review of Systems  Constitutional: Negative for fever.  Respiratory: Negative for shortness of breath.   Cardiovascular: Negative for chest pain.   Gastrointestinal: Positive for nausea and vomiting. Negative for abdominal pain, diarrhea and constipation.  Neurological: Negative for seizures, weakness and headaches.  All other systems reviewed and are negative.     Allergies  Aspirin  Home Medications   Prior to Admission medications   Medication Sig Start Date End Date Taking? Authorizing Provider  amoxicillin (AMOXIL) 500 MG capsule  12/24/13   Historical Provider, MD  aspirin EC 81 MG tablet Take 81 mg by mouth daily.    Historical Provider, MD  azithromycin (ZITHROMAX Z-PAK) 250 MG tablet 2 po day one, then 1 daily x 4 days 01/11/14   Nat Christen, MD  clopidogrel (PLAVIX) 75 MG tablet Take 75 mg by mouth daily. 12/08/13   Historical Provider, MD  finasteride (PROSCAR) 5 MG tablet Take 5 mg by mouth daily. 12/08/13   Historical Provider, MD  fluconazole (DIFLUCAN) 100 MG tablet Take 1 tablet (100 mg total) by mouth daily. 12/30/12   Robert Bellow, MD  gabapentin (NEURONTIN) 100 MG capsule Take 1 capsule (100 mg total) by mouth at bedtime. 12/22/12   Carole Civil, MD  levETIRAcetam (KEPPRA) 750 MG tablet Take 1 tablet (750 mg total) by mouth 2 (two) times daily. 10/03/12   Nita Sells, MD  levothyroxine (SYNTHROID, LEVOTHROID) 112 MCG tablet Take 112 mcg by mouth 2 (two) times daily.     Historical Provider, MD  loperamide (IMODIUM) 2 MG capsule Take 1 capsule (2 mg total) by mouth 4 (four) times daily -  before meals and at bedtime. 12/30/12   Robert Bellow, MD  LORazepam (ATIVAN) 0.5 MG tablet Take 1 tablet (0.5 mg total) by mouth 2 (two) times daily as needed for anxiety. 12/30/12   Robert Bellow, MD  losartan (COZAAR) 100 MG tablet Take 100 mg by mouth daily. 05/03/14   Historical Provider, MD  memantine (NAMENDA) 10 MG tablet Take 10 mg by mouth 2 (two) times daily.    Historical Provider, MD  omeprazole (PRILOSEC) 20 MG capsule 1 po every morning 08/18/13   Orvil Feil, NP  potassium chloride (K-DUR) 10 MEQ  tablet Take 1 tablet (10 mEq total) by mouth 2 (two) times daily. 10/01/12   Robert Bellow, MD  potassium chloride (K-DUR,KLOR-CON) 10 MEQ tablet Take 10 mEq by mouth 2 (two) times daily. 05/05/14   Historical Provider, MD  tamsulosin (FLOMAX) 0.4 MG CAPS capsule Take 0.4 mg by mouth daily. 12/08/13   Historical Provider, MD   BP 190/91 mmHg  Pulse 95  Temp(Src) 98.6 F (37 C) (Oral)  Resp 20  Ht 5\' 10"  (1.778 m)  Wt 185 lb (83.915 kg)  BMI 26.54 kg/m2  SpO2 100% Physical Exam CONSTITUTIONAL: Well developed/well nourished HEAD: Normocephalic/atraumatic EYES: EOMI/PERRL ENMT: Mucous membranes moist NECK: supple no meningeal signs SPINE/BACK:entire spine nontender CV: S1/S2 noted, no murmurs/rubs/gallops noted LUNGS: Lungs are clear to auscultation bilaterally, no apparent distress ABDOMEN: soft, nontender, no rebound or guarding, bowel sounds noted throughout abdomen GU:no cva tenderness NEURO: Pt is awake/alert/appropriate, moves all extremitiesx4.  No facial droop.   EXTREMITIES: pulses normal/equal, full ROM SKIN: warm, color normal PSYCH: no abnormalities of mood noted, alert and oriented to situation  ED Course  Procedures   7:38 PM Pt with isolated vomiting He is well appearing Appears younger than stated age He denies CP No abd pain No HA No constipation/diarrhea He had another episode of vomiting here Another round of zofran and IV fluids given to patient 9:22 PM Pt improved  He is taking PO He feels well for d/c home  we discussed strict return precautions zofran oral meds given at discharge  We discussed appropriate use  Labs Review Labs Reviewed  COMPREHENSIVE METABOLIC PANEL - Abnormal; Notable for the following:    Creatinine, Ser 1.59 (*)    Total Bilirubin 1.4 (*)    GFR calc non Af Amer 36 (*)    GFR calc Af Amer 42 (*)    All other components within normal limits  CBC WITH DIFFERENTIAL - Abnormal; Notable for the following:    Platelets 114  (*)    Neutrophils Relative % 89 (*)    Lymphocytes Relative 6 (*)    Lymphs Abs 0.5 (*)    All other components within normal limits  LIPASE, BLOOD  TROPONIN I  URINALYSIS, ROUTINE W REFLEX MICROSCOPIC     EKG Interpretation   Date/Time:  Tuesday May 18 2014 16:16:43 EST Ventricular Rate:  87 PR Interval:  249 QRS Duration: 138 QT Interval:  408 QTC Calculation: 491 R Axis:   -10 Text Interpretation:  Sinus rhythm Prolonged PR interval Right bundle  branch block No significant change since last tracing Confirmed by  Christy Gentles  MD, Tsutomu Barfoot ([id number]) on 05/18/2014 4:19:51 PM  Medications  ondansetron (ZOFRAN) injection 4 mg (4 mg Intravenous Given 05/18/14 1624)  ondansetron (ZOFRAN) injection 4 mg (4 mg Intravenous Given 05/18/14 1900)  sodium chloride 0.9 % bolus 500 mL (0 mLs Intravenous Stopped 05/18/14 2016)    MDM   Final diagnoses:  Non-intractable vomiting with nausea, vomiting of unspecified type  Dehydration    Nursing notes including past medical history and social history reviewed and considered in documentation Labs/vital reviewed myself and considered during evaluation     Sharyon Cable, MD 05/18/14 2123

## 2014-05-18 NOTE — ED Notes (Signed)
Pt tolerated PO fluids without vomiting.

## 2014-05-18 NOTE — ED Notes (Signed)
Pt had x1 episode of vomiting at 1863

## 2014-05-18 NOTE — ED Notes (Signed)
Pt advised to try taking small sip of water to see if he is able to tolerate fluids.

## 2014-05-18 NOTE — ED Notes (Signed)
Pt alert & oriented x4, stable gait. Iv removed from pt arm. Patient  given discharge instructions, paperwork & prescription(s). Patient verbalized understanding. Pt left department by wheelchair w/ no further questions.

## 2014-05-18 NOTE — ED Notes (Signed)
Vomiting , onset today when in Dr Vickey Sages office, [name] to MD to check his Bp.  NO diarrhea. Alert no abd pain

## 2014-05-18 NOTE — ED Notes (Signed)
Lab at bedside

## 2014-07-29 IMAGING — CT CT HEAD W/O CM
4 of 6 series · 16 of 47 positions shown, 18 images · non-contrast
Comparison: CT head 03/02/2012.  MRI brain 07/25/2006.

CT HEAD

CLINICAL DATA: Altered mental status.  Fall.  Left-sided weakness
all day.  Slurred speech.  History of TIAs.

CT HEAD WITHOUT CONTRAST
CT CERVICAL SPINE WITHOUT CONTRAST
TECHNIQUE: Multidetector CT imaging of the head and cervical spine
was performed following the standard protocol without intravenous
contrast.  Multiplanar CT image reconstructions of the cervical
spine were also generated.

[Series 3: recon 2: brain · axial · 0.47mm/px · z∈[-114,-68]mm · 3 of 56 slices shown]
[im 10/56  brain]
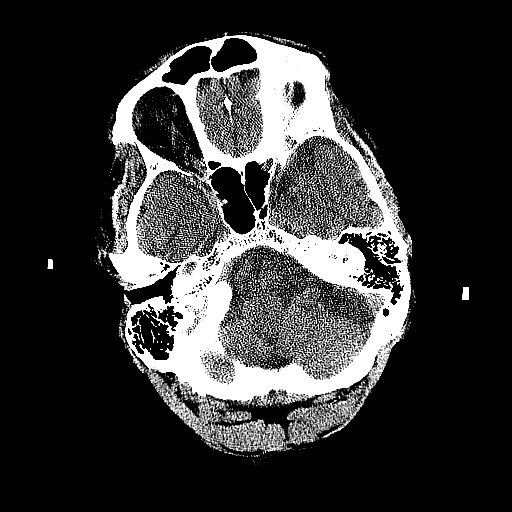
[im 19/56  brain]
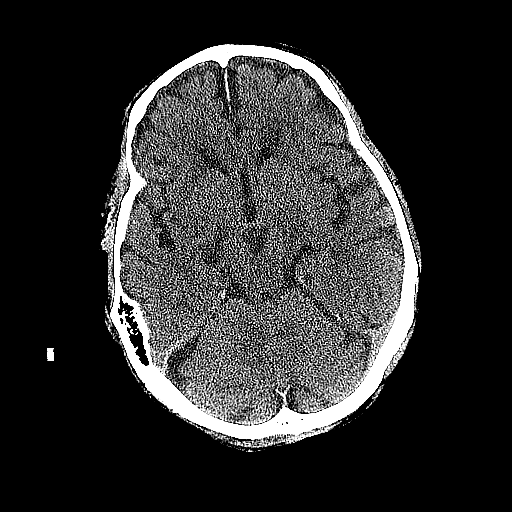
[im 28/56  brain]
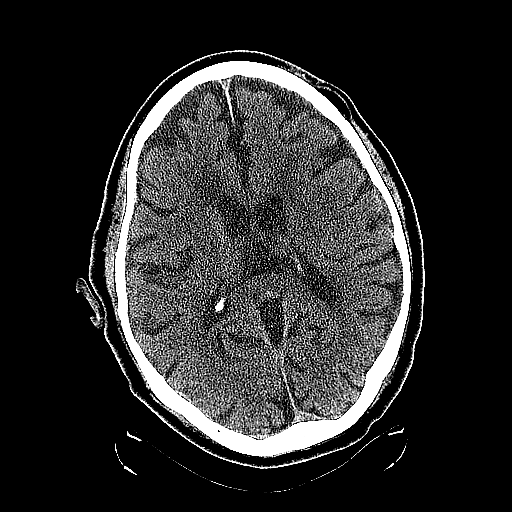

[Series 600: cor · coronal · 0.42mm/px · 3 of 46 slices shown]
[im 16/46  brain]
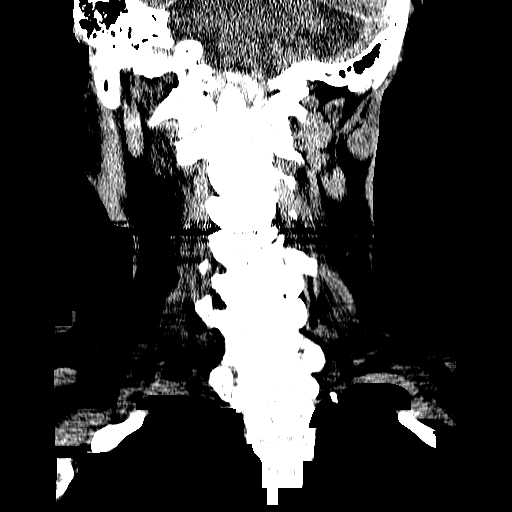
[im 21/46  brain]
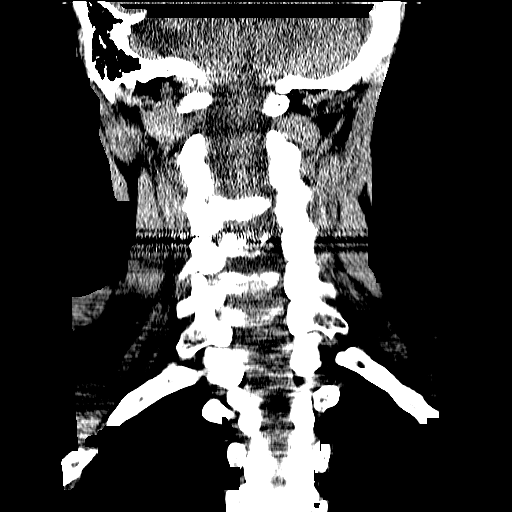
[im 26/46  brain]
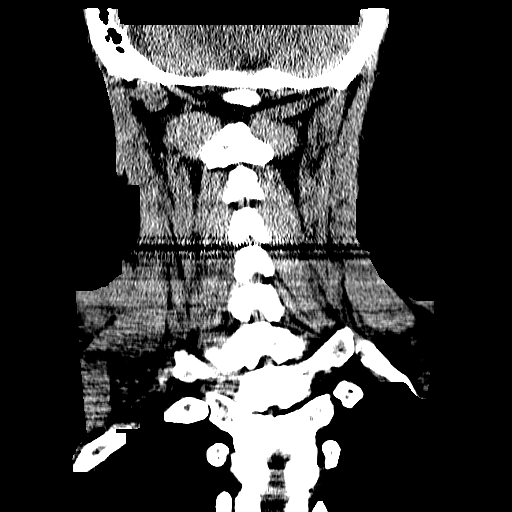

[Series 601: axial · axial · 0.42mm/px · z∈[-325,-182]mm · 7 of 69 slices shown, 9 images]
[im 9/69  brain]
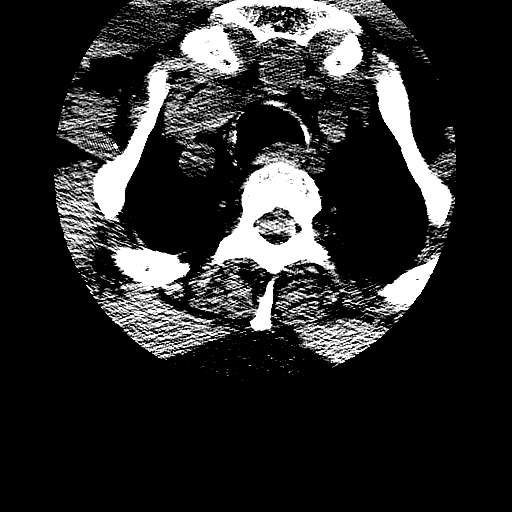
[im 9/69  bone]
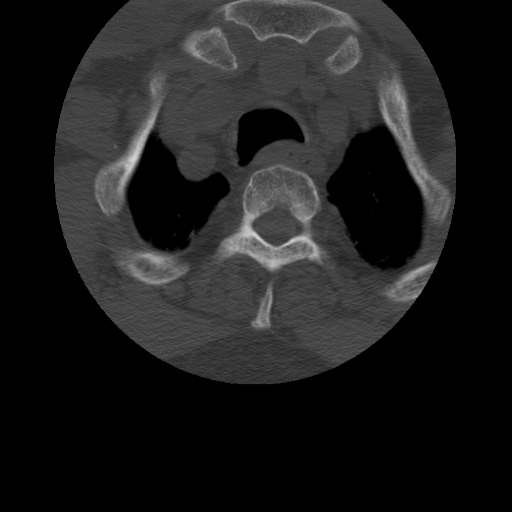
[im 18/69  brain]
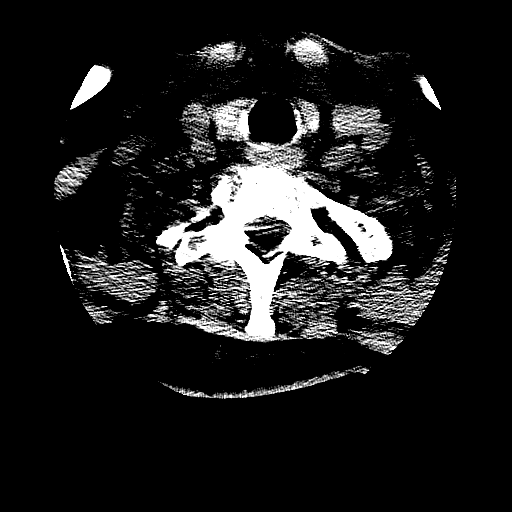
[im 26/69  brain]
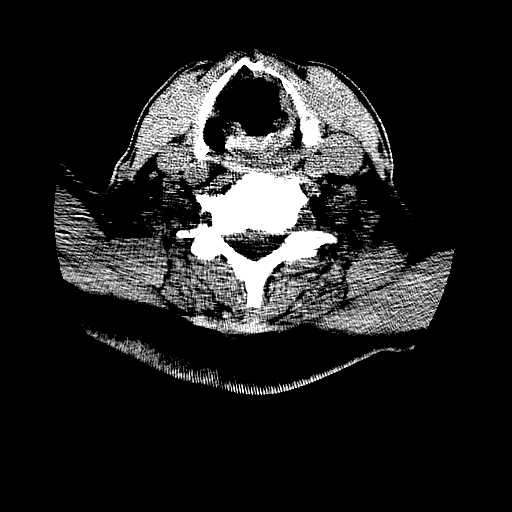
[im 35/69  brain]
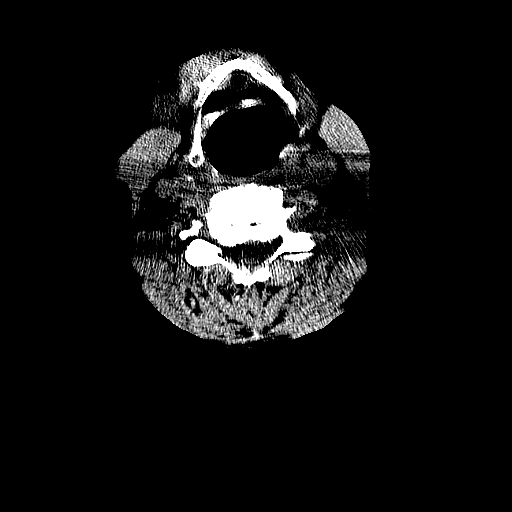
[im 43/69  brain]
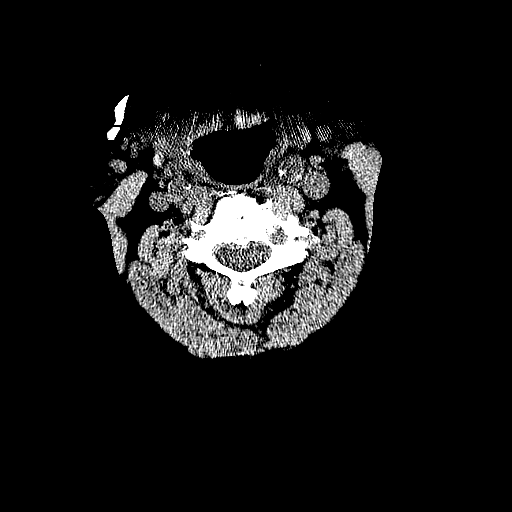
[im 43/69  bone]
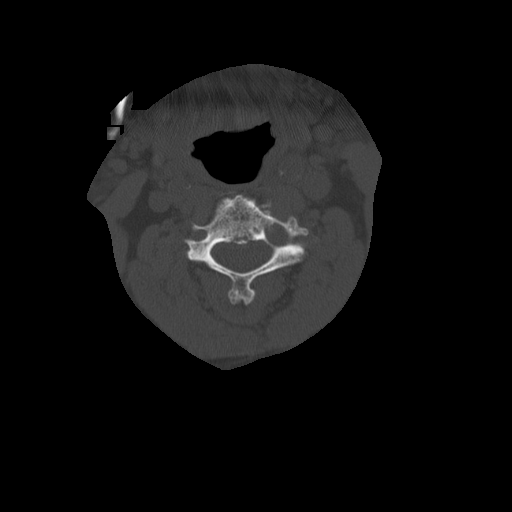
[im 52/69  brain]
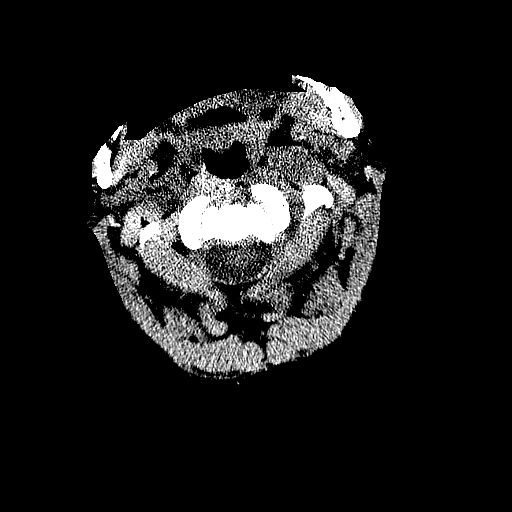
[im 60/69  brain]
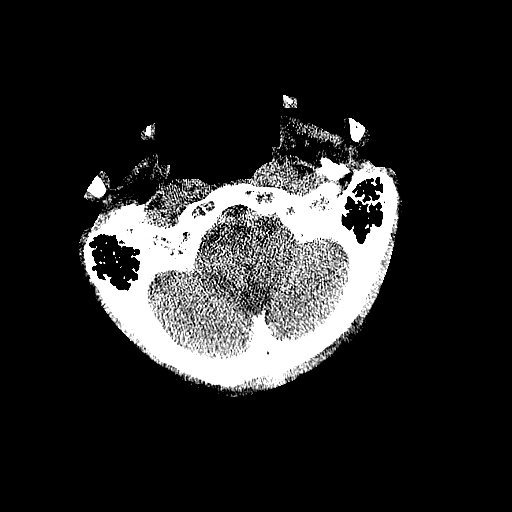

[Series 602: sag · sagittal · 0.42mm/px · 3 of 37 slices shown]
[im 13/37  brain]
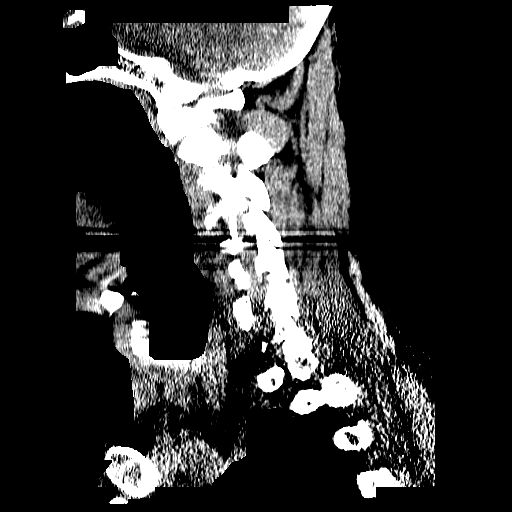
[im 19/37  brain]
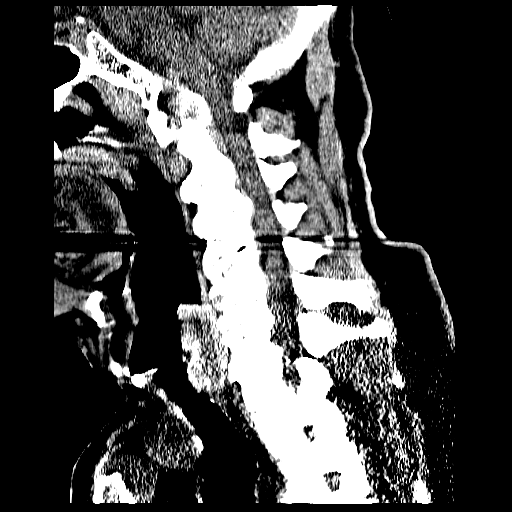
[im 25/37  brain]
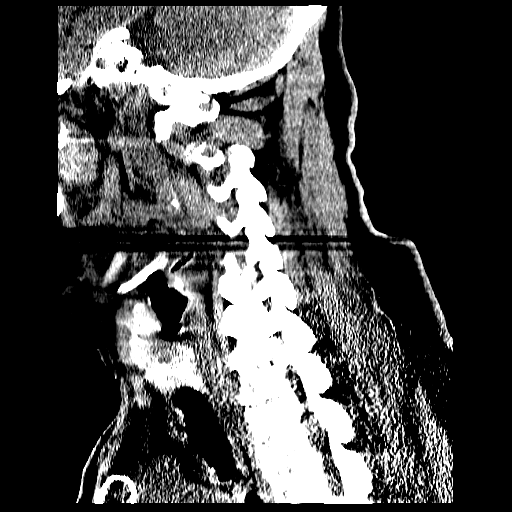

[16 of 47 positions shown; findings below may reference images not displayed]

FINDINGS: Diffuse cerebral atrophy.  Mild ventricular dilatation
suggesting central atrophy.  Low attenuation changes in the deep
white matter consistent with small vessel ischemic change.  Old
lacunar infarcts in the thalami.  No mass effect or midline shift.
No abnormal extra-axial fluid collections.  Gray-white matter
junctions are distinct.  Basal cisterns are not effaced.  No
evidence of acute intracranial hemorrhage.  No significant change
in the appearance intracranial contents since the previous study.
No depressed skull fractures.  Visualized paranasal sinuses and
mastoid air cells are not opacified.  Vascular calcifications.
IMPRESSION: No acute intracranial abnormalities.  Chronic atrophy and small
vessel ischemic changes.  Old lacunar infarcts.

CT CERVICAL SPINE
FINDINGS: Reversal of the usual cervical lordosis which is likely
due to patient positioning but ligamentous injury or muscle spasm
can also have this appearance.  No anterior subluxation.  Normal
alignment of the facet joints.  Lateral masses of C1 appear
symmetrical.  The odontoid process appears intact.  Degenerative
changes throughout the cervical spine with narrowed cervical
interspaces and endplate hypertrophic changes.  Degenerative disc
disease at C4-5.  Degenerative changes in the cervical facet joints
and C1-2 level.  No vertebral compression deformities.  No
prevertebral soft tissue swelling.  No focal bone lesion or bone
destruction.
IMPRESSION: Diffuse degenerative change throughout the cervical spine.  No
displaced fractures identified.

Code stroke results were telephoned to Dr. Dwayne in the emergency

## 2015-05-18 ENCOUNTER — Other Ambulatory Visit (HOSPITAL_COMMUNITY): Payer: Self-pay | Admitting: Urology

## 2015-05-18 DIAGNOSIS — C61 Malignant neoplasm of prostate: Secondary | ICD-10-CM

## 2015-05-25 ENCOUNTER — Encounter (HOSPITAL_COMMUNITY): Payer: Medicare HMO

## 2015-07-19 ENCOUNTER — Encounter (HOSPITAL_COMMUNITY)
Admission: RE | Admit: 2015-07-19 | Discharge: 2015-07-19 | Disposition: A | Discharge status: Home / Self Care | Payer: Medicare HMO | Source: Ambulatory Visit | Attending: Urology | Admitting: Urology

## 2015-07-19 DIAGNOSIS — C61 Malignant neoplasm of prostate: Secondary | ICD-10-CM | POA: Insufficient documentation

## 2015-07-19 MED ORDER — TECHNETIUM TC 99M MEDRONATE IV KIT
18.5000 | PACK | Freq: Once | INTRAVENOUS | Status: AC | PRN
  Administered 2015-07-19: 18.5 via INTRAVENOUS

## 2018-04-20 ENCOUNTER — Other Ambulatory Visit: Payer: Self-pay

## 2018-04-20 ENCOUNTER — Emergency Department (HOSPITAL_COMMUNITY): Payer: Medicare HMO

## 2018-04-20 ENCOUNTER — Emergency Department (HOSPITAL_COMMUNITY)
Admission: EM | Admit: 2018-04-20 | Discharge: 2018-04-20 | Disposition: A | Discharge status: Home / Self Care | Payer: Medicare HMO | Attending: Emergency Medicine | Admitting: Emergency Medicine

## 2018-04-20 ENCOUNTER — Encounter (HOSPITAL_COMMUNITY): Payer: Self-pay

## 2018-04-20 DIAGNOSIS — R2243 Localized swelling, mass and lump, lower limb, bilateral: Secondary | ICD-10-CM | POA: Diagnosis present

## 2018-04-20 DIAGNOSIS — Z79899 Other long term (current) drug therapy: Secondary | ICD-10-CM | POA: Insufficient documentation

## 2018-04-20 DIAGNOSIS — G40909 Epilepsy, unspecified, not intractable, without status epilepticus: Secondary | ICD-10-CM | POA: Diagnosis not present

## 2018-04-20 DIAGNOSIS — R600 Localized edema: Secondary | ICD-10-CM | POA: Insufficient documentation

## 2018-04-20 DIAGNOSIS — F0390 Unspecified dementia without behavioral disturbance: Secondary | ICD-10-CM | POA: Insufficient documentation

## 2018-04-20 DIAGNOSIS — Z7982 Long term (current) use of aspirin: Secondary | ICD-10-CM | POA: Insufficient documentation

## 2018-04-20 DIAGNOSIS — I739 Peripheral vascular disease, unspecified: Secondary | ICD-10-CM | POA: Diagnosis not present

## 2018-04-20 DIAGNOSIS — R609 Edema, unspecified: Secondary | ICD-10-CM

## 2018-04-20 DIAGNOSIS — E039 Hypothyroidism, unspecified: Secondary | ICD-10-CM | POA: Diagnosis not present

## 2018-04-20 DIAGNOSIS — I10 Essential (primary) hypertension: Secondary | ICD-10-CM | POA: Insufficient documentation

## 2018-04-20 LAB — CBC WITH DIFFERENTIAL/PLATELET
Abs Immature Granulocytes: 0.02 10*3/uL (ref 0.00–0.07)
Basophils Absolute: 0.2 10*3/uL — ABNORMAL HIGH (ref 0.0–0.1)
Basophils Relative: 2 %
Eosinophils Absolute: 1 10*3/uL — ABNORMAL HIGH (ref 0.0–0.5)
Eosinophils Relative: 13 %
HCT: 32.6 % — ABNORMAL LOW (ref 39.0–52.0)
Hemoglobin: 10.1 g/dL — ABNORMAL LOW (ref 13.0–17.0)
Immature Granulocytes: 0 %
Lymphocytes Relative: 24 %
Lymphs Abs: 1.9 10*3/uL (ref 0.7–4.0)
MCH: 27.9 pg (ref 26.0–34.0)
MCHC: 31 g/dL (ref 30.0–36.0)
MCV: 90.1 fL (ref 80.0–100.0)
Monocytes Absolute: 0.6 10*3/uL (ref 0.1–1.0)
Monocytes Relative: 8 %
Neutro Abs: 4.3 10*3/uL (ref 1.7–7.7)
Neutrophils Relative %: 53 %
Platelets: 159 10*3/uL (ref 150–400)
RBC: 3.62 MIL/uL — ABNORMAL LOW (ref 4.22–5.81)
RDW: 14.3 % (ref 11.5–15.5)
WBC: 7.9 10*3/uL (ref 4.0–10.5)
nRBC: 0 % (ref 0.0–0.2)

## 2018-04-20 LAB — COMPREHENSIVE METABOLIC PANEL
ALT: 10 U/L (ref 0–44)
AST: 20 U/L (ref 15–41)
Albumin: 4.4 g/dL (ref 3.5–5.0)
Alkaline Phosphatase: 93 U/L (ref 38–126)
Anion gap: 8 (ref 5–15)
BUN: 14 mg/dL (ref 8–23)
CO2: 25 mmol/L (ref 22–32)
Calcium: 9.6 mg/dL (ref 8.9–10.3)
Chloride: 103 mmol/L (ref 98–111)
Creatinine, Ser: 1.48 mg/dL — ABNORMAL HIGH (ref 0.61–1.24)
GFR calc Af Amer: 45 mL/min — ABNORMAL LOW (ref >60)
GFR calc non Af Amer: 38 mL/min — ABNORMAL LOW (ref >60)
Glucose, Bld: 107 mg/dL — ABNORMAL HIGH (ref 70–99)
Potassium: 3.3 mmol/L — ABNORMAL LOW (ref 3.5–5.1)
Sodium: 136 mmol/L (ref 135–145)
Total Bilirubin: 1.2 mg/dL (ref 0.3–1.2)
Total Protein: 8.2 g/dL — ABNORMAL HIGH (ref 6.5–8.1)

## 2018-04-20 LAB — BRAIN NATRIURETIC PEPTIDE: B Natriuretic Peptide: 46 pg/mL (ref 0.0–100.0)

## 2018-04-20 LAB — URINALYSIS, ROUTINE W REFLEX MICROSCOPIC
Bilirubin Urine: NEGATIVE
Glucose, UA: NEGATIVE mg/dL
Hgb urine dipstick: NEGATIVE
Ketones, ur: NEGATIVE mg/dL
Leukocytes, UA: NEGATIVE
Nitrite: NEGATIVE
Protein, ur: NEGATIVE mg/dL
Specific Gravity, Urine: 1.004 — ABNORMAL LOW (ref 1.005–1.030)
pH: 6 (ref 5.0–8.0)

## 2018-04-20 MED ORDER — FUROSEMIDE 20 MG PO TABS: 20.0000 mg | ORAL_TABLET | Freq: Two times a day (BID) | ORAL | 0 refills | Status: DC

## 2018-04-20 MED ORDER — POTASSIUM CHLORIDE ER 20 MEQ PO TBCR: 20.0000 meq | EXTENDED_RELEASE_TABLET | Freq: Two times a day (BID) | ORAL | 0 refills | Status: DC

## 2018-04-20 MED ORDER — LOSARTAN POTASSIUM 25 MG PO TABS
100.0000 mg | ORAL_TABLET | Freq: Every day | ORAL | Status: DC
  Administered 2018-04-20: 100 mg via ORAL
  Filled 2018-04-20: qty 4

## 2018-04-20 MED ORDER — POTASSIUM CHLORIDE CRYS ER 20 MEQ PO TBCR
40.0000 meq | EXTENDED_RELEASE_TABLET | Freq: Once | ORAL | Status: AC
  Administered 2018-04-20: 40 meq via ORAL
  Filled 2018-04-20: qty 2

## 2018-04-20 MED ORDER — FUROSEMIDE 40 MG PO TABS
40.0000 mg | ORAL_TABLET | Freq: Once | ORAL | Status: AC
  Administered 2018-04-20: 40 mg via ORAL
  Filled 2018-04-20: qty 1

## 2018-04-20 NOTE — ED Provider Notes (Signed)
[facility] EMERGENCY DEPARTMENT Provider Note   CSN: [id number] Arrival date & time: 04/20/18  2426     History   Chief Complaint Chief Complaint  Patient presents with  . Leg Swelling    HPI [name] is a 84 y.o. male.  HPI   84 year old male with pain in bilateral lower extremities.  He has had increasing bilateral lower extremity edema over the past 2 weeks or so.  Is come to the point where his skin has become so intense it is cracking.  He has been having some serous drainage.  He has been applying moisturizers without improvement.  He has no other acute complaints.  He denies any shortness of breath.  No orthopnea.  No cough.  Denies any chest pain.  No palpitations.  Past Medical History:  Diagnosis Date  . Cancer [facility])    prostate  . Dementia ([place])   . HTN (hypertension)   . Seizures ([place])   . Stroke [facility])    TIA  . Thyroid disease     Patient Active Problem List   Diagnosis Date Noted  . Other dysphagia 12/29/2012  . Generalized weakness 12/22/2012  . ARF (acute renal failure) ([place]) 12/22/2012  . Weight loss 12/22/2012  . Urinary retention 12/22/2012  . Leg swelling 12/22/2012  . Transaminitis 12/22/2012  . Hypokalemia 12/22/2012  . Seizure ([place]) 10/02/2012  . Disc degeneration, lumbosacral 10/16/2011  . Pain, hip 09/26/2011  . Left hip pain 09/26/2011  . Leg pain 09/26/2011  . Lumbosacral neuritis 09/26/2011  . DJD (degenerative joint disease) of hip 09/26/2011  . Peripheral vascular disease ([place]) 09/26/2011  . ECZEMA 01/06/2008  . COUGH 08/08/2007  . HYPOTHYROIDISM 06/25/2007  . CATARACTS, BILATERAL 06/25/2007  . HYPERTENSION 06/25/2007  . ALLERGIC RHINITIS 06/25/2007  . ARTHRITIS 06/25/2007  . SEIZURE DISORDER 06/25/2007  . PROSTATE CANCER, HX OF 06/25/2007    Past Surgical History:  Procedure Laterality Date  . BACK SURGERY     cervical spine  . COLONOSCOPY     at [facility], polyp per wife  . CYSTOSCOPY N/A 12/25/2012   Procedure: CYSTOSCOPY FLEXIBLE;  Surgeon: Marissa Nestle, MD;  Location: [facility] ORS;  Service: Urology;  Laterality: N/A;  . ESOPHAGOGASTRODUODENOSCOPY (EGD) WITH ESOPHAGEAL DILATION N/A 12/27/2012   Procedure: ESOPHAGOGASTRODUODENOSCOPY (EGD) WITH ESOPHAGEAL DILATION;  Surgeon: Danie Binder, MD;  Location: [facility] ENDO SUITE;  Service: Endoscopy;  Laterality: N/A;  . HERNIA REPAIR     x 2  . PROSTATE SURGERY          Home Medications    Prior to Admission medications   Medication Sig Start Date End Date Taking? Authorizing Provider  aspirin EC 81 MG tablet Take 81 mg by mouth daily.   Yes [provider]  clopidogrel (PLAVIX) 75 MG tablet Take 75 mg by mouth daily. 12/08/13  Yes [provider]  gabapentin (NEURONTIN) 100 MG capsule Take 1 capsule (100 mg total) by mouth at bedtime. 12/22/12  Yes Carole Civil, MD  levETIRAcetam (KEPPRA) 750 MG tablet Take 1 tablet (750 mg total) by mouth 2 (two) times daily. 10/03/12  Yes Nita Sells, MD  levothyroxine (SYNTHROID, LEVOTHROID) 112 MCG tablet Take 112 mcg by mouth 2 (two) times daily.    Yes [provider]  LORazepam (ATIVAN) 0.5 MG tablet Take 1 tablet (0.5 mg total) by mouth 2 (two) times daily as needed for anxiety. 12/30/12  Yes [name], MD  losartan (COZAAR) 100 MG tablet Take 100 mg  by mouth daily. 05/03/14  Yes [provider]  memantine (NAMENDA) 10 MG tablet Take 10 mg by mouth 2 (two) times daily.   Yes [provider]  ondansetron (ZOFRAN) 4 MG tablet Take 1 tablet (4 mg total) by mouth every 6 (six) hours. 05/18/14  Yes Ripley Fraise, MD  potassium chloride (K-DUR) 10 MEQ tablet Take 1 tablet (10 mEq total) by mouth 2 (two) times daily. 10/01/12  Yes [name], MD    Family History Family History  Problem Relation Age of Onset  . Arthritis Unknown   . Cancer Brother        rectal  . Asthma Unknown   . Diabetes Unknown     Social History Social History     Tobacco Use  . Smoking status: Never Smoker  Substance Use Topics  . Alcohol use: No  . Drug use: No     Allergies   Aspirin   Review of Systems Review of Systems  All systems reviewed and negative, other than as noted in HPI.  Physical Exam Updated Vital Signs BP (!) 199/85   Pulse 61   Temp 97.6 F (36.4 C) (Oral)   Resp 14   Wt 72.6 kg   SpO2 97%   BMI 22.96 kg/m   Physical Exam  Constitutional: He appears well-developed and well-nourished. No distress.  HENT:  Head: Normocephalic and atraumatic.  Eyes: Conjunctivae are normal. Right eye exhibits no discharge. Left eye exhibits no discharge.  Neck: Neck supple.  Cardiovascular: Normal rate, regular rhythm and normal heart sounds. Exam reveals no gallop and no friction rub.  No murmur heard. Pulmonary/Chest: Effort normal and breath sounds normal. No respiratory distress.  Abdominal: Soft. He exhibits no distension. There is no tenderness.  Musculoskeletal: He exhibits edema. He exhibits no tenderness.  Tense, symmetric lower extremity edema with some cracking the skin bilaterally.  No cellulitis.  Minimal serous drainage noted in various places.  Neurological: He is alert.  Skin: Skin is warm and dry.  Psychiatric: He has a normal mood and affect. His behavior is normal. Thought content normal.  Nursing note and vitals reviewed.    ED Treatments / Results  Labs (all labs ordered are listed, but only abnormal results are displayed) Labs Reviewed  COMPREHENSIVE METABOLIC PANEL - Abnormal; Notable for the following components:      Result Value   Potassium 3.3 (*)    Glucose, Bld 107 (*)    Creatinine, Ser 1.48 (*)    Total Protein 8.2 (*)    GFR calc non Af Amer 38 (*)    GFR calc Af Amer 45 (*)    All other components within normal limits  CBC WITH DIFFERENTIAL/PLATELET - Abnormal; Notable for the following components:   RBC 3.62 (*)    Hemoglobin 10.1 (*)    HCT 32.6 (*)    Eosinophils Absolute  1.0 (*)    Basophils Absolute 0.2 (*)    All other components within normal limits  URINALYSIS, ROUTINE W REFLEX MICROSCOPIC - Abnormal; Notable for the following components:   Color, Urine STRAW (*)    APPearance HAZY (*)    Specific Gravity, Urine 1.004 (*)    All other components within normal limits  BRAIN NATRIURETIC PEPTIDE    EKG EKG Interpretation  Date/Time:  Sunday April 20 2018 10:01:06 EST Ventricular Rate:  58 PR Interval:    QRS Duration: 145 QT Interval:  479 QTC Calculation: 471 R Axis:   -10 Text Interpretation:  Sinus rhythm Prolonged PR interval Right bundle branch block Confirmed by Virgel Manifold [phone]) on 04/20/2018 11:47:03 AM   Radiology Dg Chest 2 View  Result Date: 04/20/2018 CLINICAL DATA:  Lower leg swelling x2 weeks EXAM: CHEST - 2 VIEW COMPARISON:  01/11/2014 FINDINGS: Moderate to large left pleural effusion. Associated lower lobe atelectasis. Right lung is clear. The heart is normal in size. Sclerotic foci involving the right acromion and at least two left ribs, possibly reflecting osseus metastases. IMPRESSION: Moderate to large left pleural effusion. Possible sclerotic osseous metastases, better evaluated on prior bone scan. Electronically Signed   By: Julian Hy M.D.   On: 04/20/2018 10:55    Procedures Procedures (including critical care time)  Medications Ordered in ED Medications  losartan (COZAAR) tablet 100 mg (100 mg Oral Given 04/20/18 1150)     Initial Impression / Assessment and Plan / ED Course  I have reviewed the triage vital signs and the nursing notes.  Pertinent labs & imaging results that were available during my care of the patient were reviewed by me and considered in my medical decision making (see chart for details).     84 year old male with bilateral lower extremity pain secondary to tense edema.  We will start him on a diuretic.  Keep legs elevated.  Recommended trying to wear compression hose when his  legs begin to heal.  He has no respiratory or other acute complaints.  Outpatient follow-up.  Return precautions discussed. Final Clinical Impressions(s) / ED Diagnoses   Final diagnoses:  Peripheral edema    ED Discharge Orders    None       Virgel Manifold, MD 04/22/18 [phone]

## 2018-04-20 NOTE — ED Triage Notes (Signed)
Pt reports swelling in lower legs for 2 weeks. Legs burn. Skin dry and cracked. Denies sob, cp

## 2018-04-23 ENCOUNTER — Inpatient Hospital Stay (HOSPITAL_COMMUNITY)
Admission: EM | Admit: 2018-04-23 | Discharge: 2018-04-28 | DRG: 292 | Disposition: A | Discharge status: Home Health | Payer: Medicare HMO | Attending: Internal Medicine | Admitting: Internal Medicine

## 2018-04-23 ENCOUNTER — Emergency Department (HOSPITAL_BASED_OUTPATIENT_CLINIC_OR_DEPARTMENT_OTHER): Payer: Medicare HMO

## 2018-04-23 ENCOUNTER — Other Ambulatory Visit: Payer: Self-pay

## 2018-04-23 ENCOUNTER — Emergency Department (HOSPITAL_COMMUNITY): Payer: Medicare HMO

## 2018-04-23 ENCOUNTER — Encounter (HOSPITAL_COMMUNITY): Payer: Self-pay | Admitting: Pharmacy Technician

## 2018-04-23 DIAGNOSIS — I5032 Chronic diastolic (congestive) heart failure: Secondary | ICD-10-CM | POA: Diagnosis present

## 2018-04-23 DIAGNOSIS — R609 Edema, unspecified: Secondary | ICD-10-CM

## 2018-04-23 DIAGNOSIS — R600 Localized edema: Secondary | ICD-10-CM | POA: Diagnosis not present

## 2018-04-23 DIAGNOSIS — I130 Hypertensive heart and chronic kidney disease with heart failure and stage 1 through stage 4 chronic kidney disease, or unspecified chronic kidney disease: Secondary | ICD-10-CM | POA: Diagnosis not present

## 2018-04-23 DIAGNOSIS — J910 Malignant pleural effusion: Secondary | ICD-10-CM | POA: Diagnosis present

## 2018-04-23 DIAGNOSIS — N183 Chronic kidney disease, stage 3 unspecified: Secondary | ICD-10-CM

## 2018-04-23 DIAGNOSIS — Z8546 Personal history of malignant neoplasm of prostate: Secondary | ICD-10-CM

## 2018-04-23 DIAGNOSIS — G629 Polyneuropathy, unspecified: Secondary | ICD-10-CM

## 2018-04-23 DIAGNOSIS — E871 Hypo-osmolality and hyponatremia: Secondary | ICD-10-CM

## 2018-04-23 DIAGNOSIS — N179 Acute kidney failure, unspecified: Secondary | ICD-10-CM

## 2018-04-23 DIAGNOSIS — Z79899 Other long term (current) drug therapy: Secondary | ICD-10-CM

## 2018-04-23 DIAGNOSIS — F419 Anxiety disorder, unspecified: Secondary | ICD-10-CM

## 2018-04-23 DIAGNOSIS — R5381 Other malaise: Secondary | ICD-10-CM

## 2018-04-23 DIAGNOSIS — D649 Anemia, unspecified: Secondary | ICD-10-CM

## 2018-04-23 DIAGNOSIS — Z8673 Personal history of transient ischemic attack (TIA), and cerebral infarction without residual deficits: Secondary | ICD-10-CM

## 2018-04-23 DIAGNOSIS — M7989 Other specified soft tissue disorders: Secondary | ICD-10-CM | POA: Diagnosis not present

## 2018-04-23 DIAGNOSIS — J9811 Atelectasis: Secondary | ICD-10-CM | POA: Diagnosis present

## 2018-04-23 DIAGNOSIS — E079 Disorder of thyroid, unspecified: Secondary | ICD-10-CM | POA: Diagnosis present

## 2018-04-23 DIAGNOSIS — Z7982 Long term (current) use of aspirin: Secondary | ICD-10-CM

## 2018-04-23 DIAGNOSIS — C61 Malignant neoplasm of prostate: Secondary | ICD-10-CM

## 2018-04-23 DIAGNOSIS — I639 Cerebral infarction, unspecified: Secondary | ICD-10-CM

## 2018-04-23 DIAGNOSIS — E039 Hypothyroidism, unspecified: Secondary | ICD-10-CM

## 2018-04-23 DIAGNOSIS — I878 Other specified disorders of veins: Secondary | ICD-10-CM | POA: Diagnosis present

## 2018-04-23 DIAGNOSIS — Z7902 Long term (current) use of antithrombotics/antiplatelets: Secondary | ICD-10-CM

## 2018-04-23 DIAGNOSIS — Z886 Allergy status to analgesic agent status: Secondary | ICD-10-CM

## 2018-04-23 DIAGNOSIS — G40909 Epilepsy, unspecified, not intractable, without status epilepticus: Secondary | ICD-10-CM

## 2018-04-23 DIAGNOSIS — F0390 Unspecified dementia without behavioral disturbance: Secondary | ICD-10-CM

## 2018-04-23 DIAGNOSIS — Z79890 Hormone replacement therapy (postmenopausal): Secondary | ICD-10-CM

## 2018-04-23 DIAGNOSIS — J90 Pleural effusion, not elsewhere classified: Secondary | ICD-10-CM

## 2018-04-23 DIAGNOSIS — C7951 Secondary malignant neoplasm of bone: Secondary | ICD-10-CM

## 2018-04-23 DIAGNOSIS — D631 Anemia in chronic kidney disease: Secondary | ICD-10-CM | POA: Diagnosis present

## 2018-04-23 LAB — CBC WITH DIFFERENTIAL/PLATELET
Abs Immature Granulocytes: 0.01 10*3/uL (ref 0.00–0.07)
BASOS PCT: 2 %
Basophils Absolute: 0.1 10*3/uL (ref 0.0–0.1)
EOS ABS: 0.6 10*3/uL — AB (ref 0.0–0.5)
EOS PCT: 11 %
HCT: 31.6 % — ABNORMAL LOW (ref 39.0–52.0)
Hemoglobin: 9.9 g/dL — ABNORMAL LOW (ref 13.0–17.0)
Immature Granulocytes: 0 %
Lymphocytes Relative: 29 %
Lymphs Abs: 1.7 10*3/uL (ref 0.7–4.0)
MCH: 27.7 pg (ref 26.0–34.0)
MCHC: 31.3 g/dL (ref 30.0–36.0)
MCV: 88.5 fL (ref 80.0–100.0)
MONO ABS: 0.5 10*3/uL (ref 0.1–1.0)
MONOS PCT: 9 %
Neutro Abs: 3 10*3/uL (ref 1.7–7.7)
Neutrophils Relative %: 49 %
PLATELETS: 150 10*3/uL (ref 150–400)
RBC: 3.57 MIL/uL — ABNORMAL LOW (ref 4.22–5.81)
RDW: 13.9 % (ref 11.5–15.5)
WBC: 6 10*3/uL (ref 4.0–10.5)
nRBC: 0 % (ref 0.0–0.2)

## 2018-04-23 LAB — BASIC METABOLIC PANEL
ANION GAP: 9 (ref 5–15)
BUN: 18 mg/dL (ref 8–23)
CALCIUM: 9.4 mg/dL (ref 8.9–10.3)
CO2: 25 mmol/L (ref 22–32)
CREATININE: 1.73 mg/dL — AB (ref 0.61–1.24)
Chloride: 98 mmol/L (ref 98–111)
GFR calc Af Amer: 37 mL/min — ABNORMAL LOW (ref >60)
GFR, EST NON AFRICAN AMERICAN: 32 mL/min — AB (ref >60)
GLUCOSE: 98 mg/dL (ref 70–99)
Potassium: 4 mmol/L (ref 3.5–5.1)
Sodium: 132 mmol/L — ABNORMAL LOW (ref 135–145)

## 2018-04-23 LAB — BRAIN NATRIURETIC PEPTIDE: B Natriuretic Peptide: 18.3 pg/mL (ref 0.0–100.0)

## 2018-04-23 MED ORDER — GABAPENTIN 100 MG PO CAPS
100.0000 mg | ORAL_CAPSULE | Freq: Every day | ORAL | Status: DC
  Administered 2018-04-24 – 2018-04-27 (×5): 100 mg via ORAL
  Filled 2018-04-23 (×5): qty 1

## 2018-04-23 MED ORDER — CLOPIDOGREL BISULFATE 75 MG PO TABS
75.0000 mg | ORAL_TABLET | Freq: Every day | ORAL | Status: DC
  Administered 2018-04-24 – 2018-04-28 (×5): 75 mg via ORAL
  Filled 2018-04-23 (×5): qty 1

## 2018-04-23 MED ORDER — LORAZEPAM 0.5 MG PO TABS
0.5000 mg | ORAL_TABLET | Freq: Two times a day (BID) | ORAL | Status: DC | PRN
  Administered 2018-04-24: 0.5 mg via ORAL
  Filled 2018-04-23: qty 1

## 2018-04-23 MED ORDER — ASPIRIN EC 81 MG PO TBEC
81.0000 mg | DELAYED_RELEASE_TABLET | Freq: Every day | ORAL | Status: DC
  Administered 2018-04-24 – 2018-04-28 (×5): 81 mg via ORAL
  Filled 2018-04-23 (×5): qty 1

## 2018-04-23 MED ORDER — ACETAMINOPHEN 650 MG RE SUPP: 650.0000 mg | Freq: Four times a day (QID) | RECTAL | Status: DC | PRN

## 2018-04-23 MED ORDER — LEVOTHYROXINE SODIUM 112 MCG PO TABS
112.0000 ug | ORAL_TABLET | Freq: Two times a day (BID) | ORAL | Status: DC
  Administered 2018-04-24 (×2): 112 ug via ORAL
  Filled 2018-04-23 (×3): qty 1

## 2018-04-23 MED ORDER — ACETAMINOPHEN 325 MG PO TABS
650.0000 mg | ORAL_TABLET | Freq: Four times a day (QID) | ORAL | Status: DC | PRN
  Administered 2018-04-24: 650 mg via ORAL
  Filled 2018-04-23: qty 2

## 2018-04-23 MED ORDER — LEVETIRACETAM 750 MG PO TABS
750.0000 mg | ORAL_TABLET | Freq: Two times a day (BID) | ORAL | Status: DC
  Administered 2018-04-24 – 2018-04-28 (×10): 750 mg via ORAL
  Filled 2018-04-23 (×10): qty 1

## 2018-04-23 MED ORDER — ACETAMINOPHEN 325 MG PO TABS
650.0000 mg | ORAL_TABLET | Freq: Once | ORAL | Status: AC
  Administered 2018-04-23: 650 mg via ORAL
  Filled 2018-04-23: qty 2

## 2018-04-23 MED ORDER — MEMANTINE HCL 10 MG PO TABS
10.0000 mg | ORAL_TABLET | Freq: Two times a day (BID) | ORAL | Status: DC
  Administered 2018-04-24 – 2018-04-28 (×10): 10 mg via ORAL
  Filled 2018-04-23 (×10): qty 1

## 2018-04-23 NOTE — H&P (Signed)
History and Physical    [name] RJJ:[id number] DOB: 12-30-1920 DOA: 04/23/2018  PCP: [name], MD Patient coming from: Home  Chief Complaint: Leg swelling  HPI: [name] is a 84 y.o. male with medical history significant of prostate cancer, dementia, hypertension, CVA, thyroid disease presenting to the hospital for evaluation of bilateral lower extremity edema.  Patient was seen in the ED on November 17 and placed on Lasix.  He was asked to follow-up with his primary care doctor.  Patient states he went to his PCPs office and no new recommendations were made.  He reports having bilateral lower extremity edema for the past 2 weeks which has partially improved with Lasix.  Reports having pain in his lower extremities (left greater than right).  Denies having any shortness of breath.  Reports having a cough productive of little amount of phlegm.  Denies having any fevers or chills.  ED Course: Afebrile, not tachycardic, and not hypotensive.  No leukocytosis.  BNP normal.  Sodium 132.  Creatinine 1.7, was 1.4 three days ago.  Bilateral lower extremity Dopplers negative for DVT.  Chest x-ray showing moderate left pleural effusion with atelectasis; no significant interval change compared with the prior exam.  Review of Systems: As per HPI otherwise 10 point review of systems negative.  Past Medical History:  Diagnosis Date  . Cancer [facility])    prostate  . Dementia ([place])   . HTN (hypertension)   . Seizures ([place])   . Stroke [facility])    TIA  . Thyroid disease     Past Surgical History:  Procedure Laterality Date  . BACK SURGERY     cervical spine  . COLONOSCOPY     at [facility], polyp per wife  . CYSTOSCOPY N/A 12/25/2012   Procedure: CYSTOSCOPY FLEXIBLE;  Surgeon: Marissa Nestle, MD;  Location: [facility] ORS;  Service: Urology;  Laterality: N/A;  . ESOPHAGOGASTRODUODENOSCOPY (EGD) WITH ESOPHAGEAL DILATION N/A 12/27/2012   Procedure: ESOPHAGOGASTRODUODENOSCOPY (EGD) WITH  ESOPHAGEAL DILATION;  Surgeon: Danie Binder, MD;  Location: [facility] ENDO SUITE;  Service: Endoscopy;  Laterality: N/A;  . HERNIA REPAIR     x 2  . PROSTATE SURGERY       reports that he has never smoked. He does not have any smokeless tobacco history on file. He reports that he does not drink alcohol or use drugs.  Allergies  Allergen Reactions  . Aspirin     REACTION: "elevated pulse". Cannot take any strength higher than 81mg     Family History  Problem Relation Age of Onset  . Arthritis Unknown   . Cancer Brother        rectal  . Asthma Unknown   . Diabetes Unknown     Prior to Admission medications   Medication Sig Start Date End Date Taking? Authorizing Provider  aspirin EC 81 MG tablet Take 81 mg by mouth daily.   Yes [provider]  clopidogrel (PLAVIX) 75 MG tablet Take 75 mg by mouth daily. 12/08/13  Yes [provider]  furosemide (LASIX) 20 MG tablet Take 1 tablet (20 mg total) by mouth 2 (two) times daily. 04/20/18  Yes Virgel Manifold, MD  gabapentin (NEURONTIN) 100 MG capsule Take 1 capsule (100 mg total) by mouth at bedtime. 12/22/12  Yes Carole Civil, MD  levETIRAcetam (KEPPRA) 750 MG tablet Take 1 tablet (750 mg total) by mouth 2 (two) times daily. 10/03/12  Yes Nita Sells, MD  levothyroxine (SYNTHROID, LEVOTHROID) 112 MCG tablet Take  112 mcg by mouth 2 (two) times daily.    Yes [provider]  LORazepam (ATIVAN) 0.5 MG tablet Take 1 tablet (0.5 mg total) by mouth 2 (two) times daily as needed for anxiety. 12/30/12  Yes [name], MD  losartan (COZAAR) 100 MG tablet Take 100 mg by mouth daily. 05/03/14  Yes [provider]  memantine (NAMENDA) 10 MG tablet Take 10 mg by mouth 2 (two) times daily.   Yes [provider]  ondansetron (ZOFRAN) 4 MG tablet Take 1 tablet (4 mg total) by mouth every 6 (six) hours. 05/18/14  Yes Ripley Fraise, MD  potassium chloride (K-DUR) 10 MEQ tablet Take 1 tablet (10 mEq  total) by mouth 2 (two) times daily. 10/01/12  Yes [name], MD  potassium chloride 20 MEQ TBCR Take 20 mEq by mouth 2 (two) times daily. Patient not taking: Reported on 04/23/2018 04/20/18   Virgel Manifold, MD    Physical Exam: Vitals:   04/23/18 2215 04/23/18 2230 04/23/18 2245 04/24/18 0005  BP: (!) 143/72 (!) 147/76 (!) 156/73 (!) 190/80  Pulse: 62 65 (!) 59 (!) 59  Resp: 20 15 20 16   Temp:    98.1 F (36.7 C)  TempSrc:    Oral  SpO2: 99% 98% 99% 99%  Weight:    72.6 kg  Height:    5\' 10"  (1.778 m)    Physical Exam  Constitutional: He is oriented to person, place, and time. No distress.  Resting comfortably in a hospital stretcher  HENT:  Head: Normocephalic.  Mouth/Throat: Oropharynx is clear and moist.  Eyes: Right eye exhibits no discharge. Left eye exhibits no discharge.  Neck: Neck supple. No tracheal deviation present.  Cardiovascular: Normal rate, regular rhythm and intact distal pulses.  Pulmonary/Chest: Effort normal. No respiratory distress. He has no wheezes. He has no rales.  Decreased breath sounds at the left lung base  Abdominal: Soft. Bowel sounds are normal. He exhibits no distension. There is no tenderness. There is no guarding.  Musculoskeletal: He exhibits edema.  +2 pitting edema of bilateral lower extremities with venous stasis dermatitis  Neurological: He is alert and oriented to person, place, and time.  Skin: Skin is warm and dry. He is not diaphoretic.  Psychiatric: His behavior is normal.     Labs on Admission: I have personally reviewed following labs and imaging studies  CBC: Recent Labs  Lab 04/20/18 1003 04/23/18 1950  WBC 7.9 6.0  NEUTROABS 4.3 3.0  HGB 10.1* 9.9*  HCT 32.6* 31.6*  MCV 90.1 88.5  PLT 159 220   Basic Metabolic Panel: Recent Labs  Lab 04/20/18 1003 04/23/18 1950  NA 136 132*  K 3.3* 4.0  CL 103 98  CO2 25 25  GLUCOSE 107* 98  BUN 14 18  CREATININE 1.48* 1.73*  CALCIUM 9.6 9.4   GFR: Estimated  Creatinine Clearance: 26.2 mL/min (A) (by C-G formula based on SCr of 1.73 mg/dL (H)). Liver Function Tests: Recent Labs  Lab 04/20/18 1003  AST 20  ALT 10  ALKPHOS 93  BILITOT 1.2  PROT 8.2*  ALBUMIN 4.4   No results for input(s): LIPASE, AMYLASE in the last 168 hours. No results for input(s): AMMONIA in the last 168 hours. Coagulation Profile: No results for input(s): INR, PROTIME in the last 168 hours. Cardiac Enzymes: No results for input(s): CKTOTAL, CKMB, CKMBINDEX, TROPONINI in the last 168 hours. BNP (last 3 results) No results for input(s): PROBNP in the last 8760 hours. HbA1C: No  results for input(s): HGBA1C in the last 72 hours. CBG: No results for input(s): GLUCAP in the last 168 hours. Lipid Profile: No results for input(s): CHOL, HDL, LDLCALC, TRIG, CHOLHDL, LDLDIRECT in the last 72 hours. Thyroid Function Tests: No results for input(s): TSH, T4TOTAL, FREET4, T3FREE, THYROIDAB in the last 72 hours. Anemia Panel: No results for input(s): VITAMINB12, FOLATE, FERRITIN, TIBC, IRON, RETICCTPCT in the last 72 hours. Urine analysis:    Component Value Date/Time   COLORURINE STRAW (A) 04/20/2018 1007   APPEARANCEUR HAZY (A) 04/20/2018 1007   LABSPEC 1.004 (L) 04/20/2018 1007   PHURINE 6.0 04/20/2018 1007   GLUCOSEU NEGATIVE 04/20/2018 1007   HGBUR NEGATIVE 04/20/2018 1007   HGBUR negative 10/17/2007 0915   BILIRUBINUR NEGATIVE 04/20/2018 1007   KETONESUR NEGATIVE 04/20/2018 1007   PROTEINUR NEGATIVE 04/20/2018 1007   UROBILINOGEN 0.2 05/18/2014 1653   NITRITE NEGATIVE 04/20/2018 1007   LEUKOCYTESUR NEGATIVE 04/20/2018 1007    Radiological Exams on Admission: Dg Chest Port 1 View  Result Date: 04/23/2018 CLINICAL DATA:  Bilateral lower extremity swelling for 2 weeks EXAM: PORTABLE CHEST 1 VIEW COMPARISON:  04/20/2018 FINDINGS: Moderate left pleural effusion with atelectasis. No right pleural effusion. No pneumothorax. Stable cardiomediastinal silhouette. No  acute osseous abnormality. IMPRESSION: Moderate left pleural effusion with atelectasis. No significant interval change compared with the prior exam. Electronically Signed   By: Kathreen Devoid   On: 04/23/2018 19:34    EKG: Pending at this time.  Assessment/Plan Principal Problem:   Bilateral lower extremity edema Active Problems:   Hypothyroidism   Seizure disorder (HCC)   Moderate sized pleural effusion   Hyponatremia   AKI (acute kidney injury) (HCC)   CKD (chronic kidney disease) stage 3, GFR 30-59 ml/min (HCC)   Chronic anemia   Physical deconditioning   CVA (cerebral vascular accident) ([place])   Neuropathy   Anxiety   Dementia ([place])   Bilateral lower extremity edema -Likely due to venous stasis -Bilateral lower extremity Dopplers negative for DVT. -Cellulitis less likely as no erythema or increased warmth noted on exam.  In addition, patient is afebrile and does not have leukocytosis. -BNP normal.  However, patient does mention having a cough and chest x-ray showing left-sided pleural effusion.  Will order echocardiogram.  -Check TSH level -Tylenol PRN pain -Hold Lasix at this time due to worsening creatinine  Moderate pleural effusion -Chest x-ray showing moderate left pleural effusion with atelectasis; no significant interval change compared with study done on November 17. -Patient has satting well on room air and is in no respiratory distress. -Consult IR in the morning for thoracentesis -Hold Lasix at this time due to worsening creatinine  Mild hyponatremia Sodium 132 in the setting of diuretic use. -Repeat BMP in a.m.  Mild AKI on CKD 3 Creatinine 1.7, was 1.4 three days ago. -Hold Lasix at this time -Avoid nephrotoxic agents/contrast -Repeat BMP in a.m.  Chronic anemia -Hemoglobin 9.9, stable.  Baseline 9-10.  Physical deconditioning -PT evaluation  History of CVA -Continue home aspirin, Plavix  Neuropathy -Continue home gabapentin  Seizure  disorder -Continue home Keppra  Hypothyroidism -Continue home Synthroid -Check TSH  Anxiety -Continue home Ativan PRN  Dementia -Continue home Namenda  DVT prophylaxis: SCDs Code Status: Patient wishes to be full code. Family Communication: Family at bedside. Disposition Plan: Anticipate discharge in 1 to 2 days. Consults called: None Admission status: Observation  Shela Leff MD [facility] Pager [phone]  If 7PM-7AM, please contact night-coverage [email or web address] Password [facility]  04/24/2018,  2:52 AM

## 2018-04-23 NOTE — ED Provider Notes (Signed)
[facility] EMERGENCY DEPARTMENT Provider Note   CSN: [id number] Arrival date & time: 04/23/18  1758     History   Chief Complaint Chief Complaint  Patient presents with  . Leg Swelling    HPI [name] is a 84 y.o. male.  HPI  84 year old male history of prostate cancer, dementia, hypertension presents complaining of a several day history of increased bilateral lower extremity edema.  He was seen in the ED on 1117 and placed on Lasix.  He was told to follow-up with his primary care doctor.  He reports he was seen in the office and had no changes to his medications.  He continues to complain of pain in both of his legs.  He reports no prior history of same.  He denies any injury, fever, chest pain, dyspnea, nausea, vomiting, diarrhea, history of DVT, history of PE, history of DVT risk factors besides history of prostate cancer.  He reports decreased swelling after taking the Lasix.  However, he states that his feet continue to hurt.  His niece who is at the bedside states that he has had problems walking due to increased pain in his feet.  Past Medical History:  Diagnosis Date  . Cancer [facility])    prostate  . Dementia ([place])   . HTN (hypertension)   . Seizures ([place])   . Stroke [facility])    TIA  . Thyroid disease     Patient Active Problem List   Diagnosis Date Noted  . Other dysphagia 12/29/2012  . Generalized weakness 12/22/2012  . ARF (acute renal failure) ([place]) 12/22/2012  . Weight loss 12/22/2012  . Urinary retention 12/22/2012  . Leg swelling 12/22/2012  . Transaminitis 12/22/2012  . Hypokalemia 12/22/2012  . Seizure ([place]) 10/02/2012  . Disc degeneration, lumbosacral 10/16/2011  . Pain, hip 09/26/2011  . Left hip pain 09/26/2011  . Leg pain 09/26/2011  . Lumbosacral neuritis 09/26/2011  . DJD (degenerative joint disease) of hip 09/26/2011  . Peripheral vascular disease ([place]) 09/26/2011  . ECZEMA 01/06/2008  . COUGH 08/08/2007  .  HYPOTHYROIDISM 06/25/2007  . CATARACTS, BILATERAL 06/25/2007  . HYPERTENSION 06/25/2007  . ALLERGIC RHINITIS 06/25/2007  . ARTHRITIS 06/25/2007  . SEIZURE DISORDER 06/25/2007  . PROSTATE CANCER, HX OF 06/25/2007    Past Surgical History:  Procedure Laterality Date  . BACK SURGERY     cervical spine  . COLONOSCOPY     at [facility], polyp per wife  . CYSTOSCOPY N/A 12/25/2012   Procedure: CYSTOSCOPY FLEXIBLE;  Surgeon: Marissa Nestle, MD;  Location: [facility] ORS;  Service: Urology;  Laterality: N/A;  . ESOPHAGOGASTRODUODENOSCOPY (EGD) WITH ESOPHAGEAL DILATION N/A 12/27/2012   Procedure: ESOPHAGOGASTRODUODENOSCOPY (EGD) WITH ESOPHAGEAL DILATION;  Surgeon: Danie Binder, MD;  Location: [facility] ENDO SUITE;  Service: Endoscopy;  Laterality: N/A;  . HERNIA REPAIR     x 2  . PROSTATE SURGERY          Home Medications    Prior to Admission medications   Medication Sig Start Date End Date Taking? Authorizing Provider  aspirin EC 81 MG tablet Take 81 mg by mouth daily.    [provider]  clopidogrel (PLAVIX) 75 MG tablet Take 75 mg by mouth daily. 12/08/13   [provider]  furosemide (LASIX) 20 MG tablet Take 1 tablet (20 mg total) by mouth 2 (two) times daily. 04/20/18   Virgel Manifold, MD  gabapentin (NEURONTIN) 100 MG capsule Take 1 capsule (100 mg total) by mouth at  bedtime. 12/22/12   Carole Civil, MD  levETIRAcetam (KEPPRA) 750 MG tablet Take 1 tablet (750 mg total) by mouth 2 (two) times daily. 10/03/12   Nita Sells, MD  levothyroxine (SYNTHROID, LEVOTHROID) 112 MCG tablet Take 112 mcg by mouth 2 (two) times daily.     [provider]  LORazepam (ATIVAN) 0.5 MG tablet Take 1 tablet (0.5 mg total) by mouth 2 (two) times daily as needed for anxiety. 12/30/12   [name], MD  losartan (COZAAR) 100 MG tablet Take 100 mg by mouth daily. 05/03/14   [provider]  memantine (NAMENDA) 10 MG tablet Take 10 mg by mouth 2 (two) times daily.     [provider]  ondansetron (ZOFRAN) 4 MG tablet Take 1 tablet (4 mg total) by mouth every 6 (six) hours. 05/18/14   Ripley Fraise, MD  potassium chloride (K-DUR) 10 MEQ tablet Take 1 tablet (10 mEq total) by mouth 2 (two) times daily. 10/01/12   [name], MD  potassium chloride 20 MEQ TBCR Take 20 mEq by mouth 2 (two) times daily. 04/20/18   Virgel Manifold, MD    Family History Family History  Problem Relation Age of Onset  . Arthritis Unknown   . Cancer Brother        rectal  . Asthma Unknown   . Diabetes Unknown     Social History Social History   Tobacco Use  . Smoking status: Never Smoker  Substance Use Topics  . Alcohol use: No  . Drug use: No     Allergies   Aspirin   Review of Systems Review of Systems  All other systems reviewed and are negative.    Physical Exam Updated Vital Signs BP (!) 177/78 (BP Location: Right Arm)   Pulse 69   Temp 98.3 F (36.8 C) (Oral)   Resp 18   Ht 1.829 m (6')   SpO2 98%   BMI 21.70 kg/m   Physical Exam  Constitutional: He is oriented to person, place, and time. He appears well-developed and well-nourished.  HENT:  Head: Normocephalic and atraumatic.  Right Ear: External ear normal.  Left Ear: External ear normal.  Nose: Nose normal.  Mouth/Throat: Oropharynx is clear and moist.  Eyes: Pupils are equal, round, and reactive to light. EOM are normal.  Neck: Normal range of motion.  Cardiovascular: Normal rate, regular rhythm and normal heart sounds.  Pulmonary/Chest: Effort normal.  Decreased breath sounds left lower lobe  Abdominal: Soft. Bowel sounds are normal.  Musculoskeletal: Normal range of motion. He exhibits edema and tenderness.  Bilateral lower extremities with erythema and some pitting edema swelling is more pronounced on left versus right  Neurological: He is alert and oriented to person, place, and time.  Skin: Skin is warm and dry. Capillary refill takes less than 2 seconds.    Psychiatric: He has a normal mood and affect.  Nursing note and vitals reviewed.    ED Treatments / Results  Labs (all labs ordered are listed, but only abnormal results are displayed) Labs Reviewed - No data to display  EKG None  Radiology Dg Chest [facility] 1 View  Result Date: 04/23/2018 CLINICAL DATA:  Bilateral lower extremity swelling for 2 weeks EXAM: PORTABLE CHEST 1 VIEW COMPARISON:  04/20/2018 FINDINGS: Moderate left pleural effusion with atelectasis. No right pleural effusion. No pneumothorax. Stable cardiomediastinal silhouette. No acute osseous abnormality. IMPRESSION: Moderate left pleural effusion with atelectasis. No significant interval change compared with the prior exam. Electronically Signed  By: Kathreen Devoid   On: 04/23/2018 19:34    Procedures Procedures (including critical care time)  Medications Ordered in ED Medications - No data to display   Initial Impression / Assessment and Plan / ED Course  I have reviewed the triage vital signs and the nursing notes.  Pertinent labs & imaging results that were available during my care of the patient were reviewed by me and considered in my medical decision making (see chart for details).     Here with days of lower extremity swelling.  He has been treated with Lasix and has had decreased some decrease of the edema but continues to have pain. No dvt seen on doppler here tonight.  Evaluation here significant for large left pleural effusion This was present on 04/20/18 but not on first prior of 2015. Discussed with Dr. Marlowe Sax and she will see for admission Final Clinical Impressions(s) / ED Diagnoses   Final diagnoses:  Peripheral edema  Pleural effusion    ED Discharge Orders    None       Pattricia Boss, MD 04/23/18 2226

## 2018-04-23 NOTE — ED Triage Notes (Signed)
Pt reports swelling to bil lower extemities. Pt denies injury.

## 2018-04-23 NOTE — Progress Notes (Signed)
*  Preliminary Results* Bilateral lower extremity venous duplex completed. Bilateral lower extremities are negative for deep vein thrombosis. There is no evidence of Baker's cyst bilaterally.  04/23/2018 7:11 PM Maudry Mayhew, MHA, RVT, RDCS, RDMS

## 2018-04-23 NOTE — ED Notes (Signed)
Vascular at the bedside. 

## 2018-04-24 ENCOUNTER — Observation Stay (HOSPITAL_COMMUNITY): Payer: Medicare HMO

## 2018-04-24 ENCOUNTER — Encounter (HOSPITAL_COMMUNITY): Payer: Self-pay | Admitting: Physician Assistant

## 2018-04-24 ENCOUNTER — Other Ambulatory Visit (HOSPITAL_COMMUNITY): Payer: Medicare HMO

## 2018-04-24 ENCOUNTER — Other Ambulatory Visit: Payer: Self-pay

## 2018-04-24 ENCOUNTER — Ambulatory Visit (HOSPITAL_COMMUNITY): Payer: Medicare HMO

## 2018-04-24 DIAGNOSIS — G629 Polyneuropathy, unspecified: Secondary | ICD-10-CM

## 2018-04-24 DIAGNOSIS — F0390 Unspecified dementia without behavioral disturbance: Secondary | ICD-10-CM | POA: Diagnosis present

## 2018-04-24 DIAGNOSIS — F419 Anxiety disorder, unspecified: Secondary | ICD-10-CM

## 2018-04-24 DIAGNOSIS — N183 Chronic kidney disease, stage 3 unspecified: Secondary | ICD-10-CM

## 2018-04-24 DIAGNOSIS — I351 Nonrheumatic aortic (valve) insufficiency: Secondary | ICD-10-CM | POA: Diagnosis not present

## 2018-04-24 DIAGNOSIS — Z7902 Long term (current) use of antithrombotics/antiplatelets: Secondary | ICD-10-CM | POA: Diagnosis not present

## 2018-04-24 DIAGNOSIS — N179 Acute kidney failure, unspecified: Secondary | ICD-10-CM

## 2018-04-24 DIAGNOSIS — D631 Anemia in chronic kidney disease: Secondary | ICD-10-CM | POA: Diagnosis present

## 2018-04-24 DIAGNOSIS — Z7982 Long term (current) use of aspirin: Secondary | ICD-10-CM | POA: Diagnosis not present

## 2018-04-24 DIAGNOSIS — E871 Hypo-osmolality and hyponatremia: Secondary | ICD-10-CM | POA: Diagnosis present

## 2018-04-24 DIAGNOSIS — E079 Disorder of thyroid, unspecified: Secondary | ICD-10-CM | POA: Diagnosis present

## 2018-04-24 DIAGNOSIS — Z79899 Other long term (current) drug therapy: Secondary | ICD-10-CM | POA: Diagnosis not present

## 2018-04-24 DIAGNOSIS — I5032 Chronic diastolic (congestive) heart failure: Secondary | ICD-10-CM | POA: Diagnosis present

## 2018-04-24 DIAGNOSIS — C7951 Secondary malignant neoplasm of bone: Secondary | ICD-10-CM | POA: Diagnosis present

## 2018-04-24 DIAGNOSIS — G40909 Epilepsy, unspecified, not intractable, without status epilepticus: Secondary | ICD-10-CM | POA: Diagnosis present

## 2018-04-24 DIAGNOSIS — Z8546 Personal history of malignant neoplasm of prostate: Secondary | ICD-10-CM | POA: Diagnosis not present

## 2018-04-24 DIAGNOSIS — I639 Cerebral infarction, unspecified: Secondary | ICD-10-CM

## 2018-04-24 DIAGNOSIS — C61 Malignant neoplasm of prostate: Secondary | ICD-10-CM

## 2018-04-24 DIAGNOSIS — E039 Hypothyroidism, unspecified: Secondary | ICD-10-CM | POA: Diagnosis present

## 2018-04-24 DIAGNOSIS — Z79890 Hormone replacement therapy (postmenopausal): Secondary | ICD-10-CM | POA: Diagnosis not present

## 2018-04-24 DIAGNOSIS — J90 Pleural effusion, not elsewhere classified: Secondary | ICD-10-CM

## 2018-04-24 DIAGNOSIS — D649 Anemia, unspecified: Secondary | ICD-10-CM

## 2018-04-24 DIAGNOSIS — J910 Malignant pleural effusion: Secondary | ICD-10-CM | POA: Diagnosis present

## 2018-04-24 DIAGNOSIS — I130 Hypertensive heart and chronic kidney disease with heart failure and stage 1 through stage 4 chronic kidney disease, or unspecified chronic kidney disease: Secondary | ICD-10-CM | POA: Diagnosis present

## 2018-04-24 DIAGNOSIS — Z8673 Personal history of transient ischemic attack (TIA), and cerebral infarction without residual deficits: Secondary | ICD-10-CM | POA: Diagnosis not present

## 2018-04-24 DIAGNOSIS — Z886 Allergy status to analgesic agent status: Secondary | ICD-10-CM | POA: Diagnosis not present

## 2018-04-24 DIAGNOSIS — J9811 Atelectasis: Secondary | ICD-10-CM | POA: Diagnosis present

## 2018-04-24 DIAGNOSIS — R5381 Other malaise: Secondary | ICD-10-CM

## 2018-04-24 DIAGNOSIS — R600 Localized edema: Secondary | ICD-10-CM | POA: Diagnosis not present

## 2018-04-24 DIAGNOSIS — I878 Other specified disorders of veins: Secondary | ICD-10-CM | POA: Diagnosis present

## 2018-04-24 DIAGNOSIS — R609 Edema, unspecified: Secondary | ICD-10-CM | POA: Diagnosis present

## 2018-04-24 HISTORY — PX: IR THORACENTESIS ASP PLEURAL SPACE W/IMG GUIDE: IMG5380

## 2018-04-24 LAB — BASIC METABOLIC PANEL
ANION GAP: 7 (ref 5–15)
BUN: 17 mg/dL (ref 8–23)
CALCIUM: 9.5 mg/dL (ref 8.9–10.3)
CO2: 27 mmol/L (ref 22–32)
Chloride: 99 mmol/L (ref 98–111)
Creatinine, Ser: 1.84 mg/dL — ABNORMAL HIGH (ref 0.61–1.24)
GFR calc Af Amer: 34 mL/min — ABNORMAL LOW (ref >60)
GFR, EST NON AFRICAN AMERICAN: 30 mL/min — AB (ref >60)
Glucose, Bld: 107 mg/dL — ABNORMAL HIGH (ref 70–99)
POTASSIUM: 4.1 mmol/L (ref 3.5–5.1)
SODIUM: 133 mmol/L — AB (ref 135–145)

## 2018-04-24 LAB — PROTEIN, PLEURAL OR PERITONEAL FLUID: Total protein, fluid: 5.1 g/dL

## 2018-04-24 LAB — TSH: TSH: 8.377 u[IU]/mL — AB (ref 0.350–4.500)

## 2018-04-24 LAB — GRAM STAIN

## 2018-04-24 LAB — BODY FLUID CELL COUNT WITH DIFFERENTIAL
Eos, Fluid: 0 %
LYMPHS FL: 81 %
Monocyte-Macrophage-Serous Fluid: 18 % — ABNORMAL LOW (ref 50–90)
NEUTROPHIL FLUID: 1 % (ref 0–25)
WBC FLUID: 512 uL (ref 0–1000)

## 2018-04-24 LAB — LACTATE DEHYDROGENASE, PLEURAL OR PERITONEAL FLUID: LD, Fluid: 162 U/L — ABNORMAL HIGH (ref 3–23)

## 2018-04-24 LAB — GLUCOSE, PLEURAL OR PERITONEAL FLUID: GLUCOSE FL: 105 mg/dL

## 2018-04-24 LAB — ECHOCARDIOGRAM COMPLETE
Height: 70 in
WEIGHTICAEL: 2560 [oz_av]

## 2018-04-24 MED ORDER — LIDOCAINE HCL 1 % IJ SOLN
INTRAMUSCULAR | Status: AC
  Filled 2018-04-24: qty 20

## 2018-04-24 MED ORDER — LEVOTHYROXINE SODIUM 112 MCG PO TABS: 112.0000 ug | ORAL_TABLET | Freq: Every day | ORAL | Status: DC

## 2018-04-24 MED ORDER — LEVOTHYROXINE SODIUM 25 MCG PO TABS
124.0000 ug | ORAL_TABLET | Freq: Every day | ORAL | Status: DC
  Administered 2018-04-25 – 2018-04-28 (×4): 124.5 ug via ORAL
  Filled 2018-04-24 (×4): qty 1

## 2018-04-24 MED ORDER — LIDOCAINE HCL (PF) 1 % IJ SOLN
INTRAMUSCULAR | Status: DC | PRN
  Administered 2018-04-24: 10 mL

## 2018-04-24 NOTE — Evaluation (Signed)
Physical Therapy Evaluation Patient Details Name: [name] MRN: [id number] DOB: Feb 11, 1921 Today's Date: 04/24/2018   History of Present Illness  Patient is a 84 y/o male presenting to the ED with primary complaints of B LE swelling and pain. Bilateral lower extremity Dopplers negative for DVT.  Chest x-ray showing moderate left pleural effusion with atelectasis. PMH significant for prostate cancer, dementia, hypertension, CVA, thyroid disease.    Clinical Impression  Mr. Genova is a very pleasant 84 y/o male admitted with the above listed diagnosis. Patient reports that prior to admission he was Mod I with all mobility at home. Patient today requiring general min guard for transfers and mobility with mild unsteadiness, however no overt LOB or need for physical assist for steadying. Patient to benefit from continued skilled PT to maximize safe and independent functional mobility prior to return home.     Follow Up Recommendations Home health PT;Supervision/Assistance - 24 hour    Equipment Recommendations  None recommended by PT    Recommendations for Other Services       Precautions / Restrictions Precautions Precautions: Fall Restrictions Weight Bearing Restrictions: No      Mobility  Bed Mobility Overal bed mobility: Needs Assistance Bed Mobility: Supine to Sit     Supine to sit: Min guard     General bed mobility comments: min guard with patient pulling up on PT as bedrail  Transfers Overall transfer level: Needs assistance Equipment used: 1 person hand held assist Transfers: Sit to/from Stand Sit to Stand: Min assist;Min guard         General transfer comment: very light Min A to power up at bedside  Ambulation/Gait Ambulation/Gait assistance: Min guard Gait Distance (Feet): 100 Feet Assistive device: 1 person hand held assist Gait Pattern/deviations: Step-through pattern;Decreased stride length;Drifts right/left Gait velocity: decreased    General Gait Details: 1 HHA with gait - no LOB or overt instability with no need for physical assist for steadying   Stairs            Wheelchair Mobility    Modified Rankin (Stroke Patients Only)       Balance Overall balance assessment: Mild deficits observed, not formally tested                                           Pertinent Vitals/Pain Pain Assessment: No/denies pain    Home Living Family/patient expects to be discharged to:: Private residence Living Arrangements: Other relatives Available Help at Discharge: Family;Available 24 hours/day Type of Home: House Home Access: Stairs to enter Entrance Stairs-Rails: Right Entrance Stairs-Number of Steps: 8 Home Layout: One level Home Equipment: None      Prior Function Level of Independence: Independent               Hand Dominance        Extremity/Trunk Assessment   Upper Extremity Assessment Upper Extremity Assessment: Overall WFL for tasks assessed    Lower Extremity Assessment Lower Extremity Assessment: Generalized weakness    Cervical / Trunk Assessment Cervical / Trunk Assessment: Normal  Communication   Communication: No difficulties  Cognition Arousal/Alertness: Awake/alert Behavior During Therapy: WFL for tasks assessed/performed Overall Cognitive Status: Within Functional Limits for tasks assessed  General Comments General comments (skin integrity, edema, etc.): family present and supportive    Exercises     Assessment/Plan    PT Assessment Patient needs continued PT services  PT Problem List Decreased strength;Decreased activity tolerance;Decreased balance;Decreased mobility;Decreased safety awareness       PT Treatment Interventions DME instruction;Gait training;Stair training;Functional mobility training;Therapeutic activities;Therapeutic exercise;Balance training;Patient/family education    PT  Goals (Current goals can be found in the Care Plan section)  Acute Rehab PT Goals Patient Stated Goal: return home PT Goal Formulation: With patient Time For Goal Achievement: 05/08/18 Potential to Achieve Goals: Good    Frequency Min 3X/week   Barriers to discharge        Co-evaluation               AM-PAC PT "6 Clicks" Daily Activity  Outcome Measure Difficulty turning over in bed (including adjusting bedclothes, sheets and blankets)?: A Little Difficulty moving from lying on back to sitting on the side of the bed? : A Little Difficulty sitting down on and standing up from a chair with arms (e.g., wheelchair, bedside commode, etc,.)?: Unable Help needed moving to and from a bed to chair (including a wheelchair)?: A Little Help needed walking in hospital room?: A Little Help needed climbing 3-5 steps with a railing? : A Little 6 Click Score: 16    End of Session Equipment Utilized During Treatment: Gait belt Activity Tolerance: Patient tolerated treatment well Patient left: in chair;with call bell/phone within reach;with family/visitor present Nurse Communication: Mobility status PT Visit Diagnosis: Unsteadiness on feet (R26.81);Other abnormalities of gait and mobility (R26.89)    Time: 5329-9242 PT Time Calculation (min) (ACUTE ONLY): 31 min   Charges:   PT Evaluation $PT Eval Moderate Complexity: 1 Mod PT Treatments $Gait Training: 8-22 mins       Lanney Gins, PT, DPT Supplemental Physical Therapist 04/24/18 3:06 PM Pager: [phone] Office: [phone]

## 2018-04-24 NOTE — Progress Notes (Signed)
  Echocardiogram 2D Echocardiogram has been performed.  Jennette Dubin 04/24/2018, 4:43 PM

## 2018-04-24 NOTE — Care Management Obs Status (Signed)
[facility] NOTIFICATION   Patient Details  Name: [name] MRN: [id number] Date of Birth: [birth year]   Medicare Observation Status Notification Given:       Marilu Favre, RN 04/24/2018, 3:49 PM

## 2018-04-24 NOTE — Progress Notes (Signed)
Received patient from ED accompanied by niece and granddaughter.  Pt. AOx3, VSS with elevated BP at 190/80 d/t right lower leg pain at 5/10 and O2Sat at 99% on RA, oriented to room, bed controls, call light and gave ice water to drink.  Administered scheduled medications including neurontin for pain.  Granddaughter just left [facility] while niece will be staying for the night.  Pt. Now resting on bed comfortably trying to get some sleep.  Will monitor.

## 2018-04-24 NOTE — Procedures (Signed)
PROCEDURE SUMMARY:  Successful US guided left thoracentesis. Yielded 1.2 L of clear yellow fluid. Patient tolerated procedure well. No immediate complications.  EBL less than 5 mL  Specimen was sent for labs.  Post procedure chest X-ray reveals no pneumothorax  Kelsie Zaborowski S Nada Godley PA-C 04/24/2018 1:45 PM

## 2018-04-24 NOTE — Progress Notes (Signed)
Patient admitted after midnight, please see H&P.   Here with lower extremity pain and swelling as well as a pleural effusion.  Was seen at [facility] ER on Monday and sent to follow with PCP-- was taking lasix with minimal improvement in symptoms for last few days.  Here again with leg pain/pleural effusion.  Known prostate CA with what appears to be mets to his bones (2017 NM study)--- patient is not sure.  Have requested records from Dr. Alinda Money.  Will request diagnostic and therapeutic thoracentesis by IR.   TSH elevated-- synthroid may be BID or just once daily, patient does not know. Will adjust to daily at a higher dose with outpatient follow up.  Eulogio Bear DO

## 2018-04-24 NOTE — Care Management Note (Signed)
Case Management Note  Patient Details  Name: [name] MRN: [id number] Date of Birth: November 06, 1920  Subjective/Objective:                    Action/Plan:  Discussed discharge plan with patient ,and  Niece. Patient from home has had [facility] in past and would like [facility] again. Referral given to [facility] with [facility] Expected Discharge Date:                  Expected Discharge Plan:  [place]  In-House Referral:     Discharge planning Services  CM Consult  Post Acute Care Choice:  Home Health Choice offered to:  Patient  DME Arranged:  N/A DME Agency:  NA  HH Arranged:  PT [place] Agency:  [facility]  Status of Service:  In process, will continue to follow  If discussed at Long Length of Stay Meetings, dates discussed:    Additional Comments:  Marilu Favre, RN 04/24/2018, 3:50 PM

## 2018-04-25 ENCOUNTER — Inpatient Hospital Stay (HOSPITAL_COMMUNITY): Payer: Medicare HMO

## 2018-04-25 ENCOUNTER — Encounter (HOSPITAL_COMMUNITY): Payer: Self-pay | Admitting: *Deleted

## 2018-04-25 LAB — BASIC METABOLIC PANEL
Anion gap: 8 (ref 5–15)
BUN: 19 mg/dL (ref 8–23)
CHLORIDE: 100 mmol/L (ref 98–111)
CO2: 26 mmol/L (ref 22–32)
Calcium: 9.3 mg/dL (ref 8.9–10.3)
Creatinine, Ser: 1.66 mg/dL — ABNORMAL HIGH (ref 0.61–1.24)
GFR calc Af Amer: 39 mL/min — ABNORMAL LOW (ref >60)
GFR calc non Af Amer: 33 mL/min — ABNORMAL LOW (ref >60)
Glucose, Bld: 110 mg/dL — ABNORMAL HIGH (ref 70–99)
Potassium: 4.1 mmol/L (ref 3.5–5.1)
SODIUM: 134 mmol/L — AB (ref 135–145)

## 2018-04-25 LAB — CBC
HCT: 34.5 % — ABNORMAL LOW (ref 39.0–52.0)
HEMOGLOBIN: 10.9 g/dL — AB (ref 13.0–17.0)
MCH: 27.8 pg (ref 26.0–34.0)
MCHC: 31.6 g/dL (ref 30.0–36.0)
MCV: 88 fL (ref 80.0–100.0)
Platelets: 196 10*3/uL (ref 150–400)
RBC: 3.92 MIL/uL — AB (ref 4.22–5.81)
RDW: 13.9 % (ref 11.5–15.5)
WBC: 7 10*3/uL (ref 4.0–10.5)
nRBC: 0 % (ref 0.0–0.2)

## 2018-04-25 LAB — PH, BODY FLUID: PH, BODY FLUID: 7.9

## 2018-04-25 LAB — LACTATE DEHYDROGENASE: LDH: 164 U/L (ref 98–192)

## 2018-04-25 MED ORDER — AMLODIPINE BESYLATE 5 MG PO TABS: 5.0000 mg | ORAL_TABLET | Freq: Every day | ORAL | Status: DC

## 2018-04-25 MED ORDER — HEPARIN SODIUM (PORCINE) 5000 UNIT/ML IJ SOLN
5000.0000 [IU] | Freq: Three times a day (TID) | INTRAMUSCULAR | Status: DC
  Administered 2018-04-25 – 2018-04-28 (×9): 5000 [IU] via SUBCUTANEOUS
  Filled 2018-04-25 (×9): qty 1

## 2018-04-25 MED ORDER — AMLODIPINE BESYLATE 10 MG PO TABS
10.0000 mg | ORAL_TABLET | Freq: Every day | ORAL | Status: DC
  Administered 2018-04-25 – 2018-04-28 (×4): 10 mg via ORAL
  Filled 2018-04-25 (×4): qty 1

## 2018-04-25 NOTE — Progress Notes (Addendum)
[facility] Kitchen   PROGRESS NOTE  OLUWASEMILORE BAHL ZDG:[id number] DOB: 1921-04-06 DOA: 04/23/2018 PCP: [name], MD  HPI/Recap of past 24 hours: [name] is a 84 y.o. male with medical history significant of prostate cancer, hypertension, CVA, thyroid disease presenting to the hospital for evaluation of bilateral lower extremity edema.  Patient was seen in the ED on November 17 and placed on Lasix.  He was asked to follow-up with his primary care doctor.  Patient states he went to his PCPs office and no new recommendations were made.  He reports having bilateral lower extremity edema for the past 2 weeks which has partially improved with Lasix.  Reports having pain in his lower extremities (left greater than right).  Denies having any shortness of breath.  Reports having a cough productive of little amount of phlegm.  Denies having any fevers or chills. In the ED, BNP normal.  Bilateral lower extremity Dopplers negative for DVT.  Chest x-ray showing moderate left pleural effusion with atelectasis.   Had L thoracentesis done on 04/24/18 by IR with 1.2L clear fluid removed.  04/25/18: Seen and examined with his niece at bedside. Feels better and breathing is improving. Denies chest pain, palpitations or dyspnea at rest. States he still drives and takes care of his ADLs. Would like to return to his home after discharge. Lives with his niece.    Assessment/Plan: Principal Problem:   Bilateral lower extremity edema Active Problems:   Hypothyroidism   Seizure disorder (HCC)   Moderate sized pleural effusion   Hyponatremia   AKI (acute kidney injury) (HCC)   CKD (chronic kidney disease) stage 3, GFR 30-59 ml/min (HCC)   Chronic anemia   Physical deconditioning   CVA (cerebral vascular accident) ([place])   Neuropathy   Anxiety   Dementia ([place])   Prostate cancer metastatic to bone (HCC)  B/L LE edema Symmetrical with hyperpigmentation, improved with raising legs Suspect 2/2 to chronic venous  stasis Negative for DVT Normal BNP  Moderate L pleural effusion post thoracentesis 1.2L clear removed by IR on 04/24/18 Unclear etiology Has hx of prostate CA with mets to bone and a suspected left mid lung lesion that will need to be monitored outpatient seen on cxr; recommend CT chest assessment CT chest no constrast  AKI on CKD 3, improving Baseline creatinine appears to be 1.4 with GFR of Creatinine today 1.66 from 1.84 Continue to avoid nephrotoxic agents Repeat BMP in the morning  Euvolemic hyponatremia Sodium 134 Fluid restriction Repeat chemistry panel in the morning  Chronic diastolic CHF Last 2D echo revealed LVEF 60 to 65% with grade 1 diastolic dysfunction Appears stable at this time  Uncontrolled hypertension Resume Lasix 20 mg daily (home dose) Holding losartan due to AKI Start amlodipine 10 mg daily Continue to monitor vital signs      Code Status: Full code  Family Communication: Niece at bedside.  All questions answered to her satisfaction  Disposition Plan: Home possibly tomorrow 04/26/2018   Consultants:  None  Procedures:  Thoracentesis  Antimicrobials:  None  DVT prophylaxis: Subcu heparin 3 times daily   Objective: Vitals:   04/24/18 1323 04/24/18 2036 04/25/18 0544 04/25/18 1357  BP: (!) 162/81 (!) 149/76 (!) 159/75 (!) 180/90  Pulse: 72 68 70 71  Resp: 17 16 16 16   Temp: 98.2 F (36.8 C) 97.6 F (36.4 C) 98.6 F (37 C) 98.3 F (36.8 C)  TempSrc: Oral Oral Oral Oral  SpO2: 100% 98% 100% 99%  Weight:  Height:        Intake/Output Summary (Last 24 hours) at 04/25/2018 1412 Last data filed at 04/25/2018 1355 Gross per 24 hour  Intake 1417 ml  Output 200 ml  Net 1217 ml   Filed Weights   04/24/18 0005  Weight: 72.6 kg    Exam:  . General: 84 y.o. year-old male well developed well nourished in no acute distress.  Alert and oriented x3. . Cardiovascular: Regular rate and rhythm with no rubs or gallops.  No  thyromegaly or JVD noted.   Marland Kitchen Respiratory: Mild expiratory rales at bases with no wheezes noted. Good inspiratory effort. . Abdomen: Soft nontender nondistended with normal bowel sounds x4 quadrants. . Musculoskeletal: Trace lower extremity edema. 2/4 pulses in all 4 extremities. . Skin: Hyperpigmentation involving the lower extremities bilaterally.  Dry skin. Marland Kitchen Psychiatry: Mood is appropriate for condition and setting   Data Reviewed: CBC: Recent Labs  Lab 04/20/18 1003 04/23/18 1950 04/25/18 0544  WBC 7.9 6.0 7.0  NEUTROABS 4.3 3.0  --   HGB 10.1* 9.9* 10.9*  HCT 32.6* 31.6* 34.5*  MCV 90.1 88.5 88.0  PLT 159 150 427   Basic Metabolic Panel: Recent Labs  Lab 04/20/18 1003 04/23/18 1950 04/24/18 0300 04/25/18 0316  NA 136 132* 133* 134*  K 3.3* 4.0 4.1 4.1  CL 103 98 99 100  CO2 25 25 27 26   GLUCOSE 107* 98 107* 110*  BUN 14 18 17 19   CREATININE 1.48* 1.73* 1.84* 1.66*  CALCIUM 9.6 9.4 9.5 9.3   GFR: Estimated Creatinine Clearance: 27.3 mL/min (A) (by C-G formula based on SCr of 1.66 mg/dL (H)). Liver Function Tests: Recent Labs  Lab 04/20/18 1003  AST 20  ALT 10  ALKPHOS 93  BILITOT 1.2  PROT 8.2*  ALBUMIN 4.4   No results for input(s): LIPASE, AMYLASE in the last 168 hours. No results for input(s): AMMONIA in the last 168 hours. Coagulation Profile: No results for input(s): INR, PROTIME in the last 168 hours. Cardiac Enzymes: No results for input(s): CKTOTAL, CKMB, CKMBINDEX, TROPONINI in the last 168 hours. BNP (last 3 results) No results for input(s): PROBNP in the last 8760 hours. HbA1C: No results for input(s): HGBA1C in the last 72 hours. CBG: No results for input(s): GLUCAP in the last 168 hours. Lipid Profile: No results for input(s): CHOL, HDL, LDLCALC, TRIG, CHOLHDL, LDLDIRECT in the last 72 hours. Thyroid Function Tests: Recent Labs    04/24/18 0300  TSH 8.377*   Anemia Panel: No results for input(s): VITAMINB12, FOLATE, FERRITIN,  TIBC, IRON, RETICCTPCT in the last 72 hours. Urine analysis:    Component Value Date/Time   COLORURINE STRAW (A) 04/20/2018 1007   APPEARANCEUR HAZY (A) 04/20/2018 1007   LABSPEC 1.004 (L) 04/20/2018 1007   PHURINE 6.0 04/20/2018 1007   GLUCOSEU NEGATIVE 04/20/2018 1007   HGBUR NEGATIVE 04/20/2018 1007   HGBUR negative 10/17/2007 0915   BILIRUBINUR NEGATIVE 04/20/2018 1007   KETONESUR NEGATIVE 04/20/2018 1007   PROTEINUR NEGATIVE 04/20/2018 1007   UROBILINOGEN 0.2 05/18/2014 1653   NITRITE NEGATIVE 04/20/2018 1007   LEUKOCYTESUR NEGATIVE 04/20/2018 1007   Sepsis Labs: @LABRCNTIP (procalcitonin:4,lacticidven:4)  ) Recent Results (from the past 240 hour(s))  Gram stain     Status: None   Collection Time: 04/24/18 12:28 PM  Result Value Ref Range Status   Specimen Description PLEURAL LEFT  Final   Special Requests NONE  Final   Gram Stain   Final    RARE WBC PRESENT, PREDOMINANTLY  PMN NO ORGANISMS SEEN Performed at [facility], [place] [address] [place], [place] [address]    Report Status 04/24/2018 FINAL  Final      Studies: No results found.  Scheduled Meds: . amLODipine  10 mg Oral Daily  . aspirin EC  81 mg Oral Daily  . clopidogrel  75 mg Oral Daily  . gabapentin  100 mg Oral QHS  . levETIRAcetam  750 mg Oral BID  . levothyroxine  124.5 mcg Oral Q0600  . memantine  10 mg Oral BID    Continuous Infusions:   LOS: 1 day     Kayleen Memos, MD [facility] Pager [phone]  If 7PM-7AM, please contact night-coverage [email or web address] Password [email or web address] 04/25/2018, 2:12 PM

## 2018-04-25 NOTE — Progress Notes (Signed)
Physical Therapy Treatment Patient Details Name: [name] MRN: [id number] DOB: 25-Oct-1920 Today's Date: 04/25/2018    History of Present Illness Patient is a 84 y/o male presenting to the ED with primary complaints of B LE swelling and pain. Bilateral lower extremity Dopplers negative for DVT.  Chest x-ray showing moderate left pleural effusion with atelectasis. PMH significant for prostate cancer, dementia, hypertension, CVA, thyroid disease.    PT Comments    Patient seen for mobility progression. Pt requires min guard/min A for mobility due to mild balance deficits. No overt LOB with mobility. Pt tolerated increased gait distance and stair training this session. Pt with SOB while ambulating with SpO2 95% on RA and HR in 80s bpm. Current plan remains appropriate.    Follow Up Recommendations  Home health PT;Supervision/Assistance - 24 hour     Equipment Recommendations  None recommended by PT    Recommendations for Other Services       Precautions / Restrictions Precautions Precautions: Fall    Mobility  Bed Mobility Overal bed mobility: Needs Assistance Bed Mobility: Supine to Sit     Supine to sit: Min assist     General bed mobility comments: son assisted with elevating trunk into sitting  Transfers Overall transfer level: Needs assistance Equipment used: (gait belt to assist ) Transfers: Sit to/from Stand Sit to Stand: Min assist         General transfer comment: assist to power up into standing and to steady upon standing   Ambulation/Gait Ambulation/Gait assistance: Min assist Gait Distance (Feet): 200 Feet Assistive device: (assist at trunk with use of gait belt) Gait Pattern/deviations: Step-through pattern;Decreased stride length;Drifts right/left Gait velocity: decreased   General Gait Details: pt with unsteady gait but no overt LOB; cues for posture and increased stride length; balance improving with increased  distance   Stairs Stairs: Yes Stairs assistance: Min guard;Min assist Stair Management: One rail Right;Alternating pattern;Forwards Number of Stairs: 6 General stair comments: cues for step to pattern to conserve energy; assist to steady   Wheelchair Mobility    Modified Rankin (Stroke Patients Only)       Balance Overall balance assessment: Mild deficits observed, not formally tested                                          Cognition Arousal/Alertness: Awake/alert Behavior During Therapy: WFL for tasks assessed/performed Overall Cognitive Status: Within Functional Limits for tasks assessed                                        Exercises      General Comments General comments (skin integrity, edema, etc.): son present in room      Pertinent Vitals/Pain Pain Assessment: No/denies pain    Home Living                      Prior Function            PT Goals (current goals can now be found in the care plan section) Acute Rehab PT Goals Patient Stated Goal: return home Progress towards PT goals: Progressing toward goals    Frequency    Min 3X/week      PT Plan Current plan remains appropriate    Co-evaluation  AM-PAC PT "6 Clicks" Daily Activity  Outcome Measure  Difficulty turning over in bed (including adjusting bedclothes, sheets and blankets)?: A Little Difficulty moving from lying on back to sitting on the side of the bed? : Unable Difficulty sitting down on and standing up from a chair with arms (e.g., wheelchair, bedside commode, etc,.)?: Unable Help needed moving to and from a bed to chair (including a wheelchair)?: A Little Help needed walking in hospital room?: A Little Help needed climbing 3-5 steps with a railing? : A Little 6 Click Score: 14    End of Session Equipment Utilized During Treatment: Gait belt Activity Tolerance: Patient tolerated treatment well Patient left:  with call bell/phone within reach;with family/visitor present;Other (comment)(pt in bathroom and son present and able to assist when ready) Nurse Communication: Mobility status PT Visit Diagnosis: Unsteadiness on feet (R26.81);Other abnormalities of gait and mobility (R26.89)     Time: 9702-6378 PT Time Calculation (min) (ACUTE ONLY): 20 min  Charges:  $Gait Training: 8-22 mins                     Earney Navy, PTA Acute Rehabilitation Services Pager: [phone] Office: [phone]     Darliss Cheney 04/25/2018, 3:58 PM

## 2018-04-26 DIAGNOSIS — N179 Acute kidney failure, unspecified: Secondary | ICD-10-CM

## 2018-04-26 DIAGNOSIS — J90 Pleural effusion, not elsewhere classified: Secondary | ICD-10-CM

## 2018-04-26 DIAGNOSIS — R600 Localized edema: Secondary | ICD-10-CM

## 2018-04-26 LAB — BASIC METABOLIC PANEL
Anion gap: 8 (ref 5–15)
BUN: 15 mg/dL (ref 8–23)
CHLORIDE: 99 mmol/L (ref 98–111)
CO2: 26 mmol/L (ref 22–32)
Calcium: 9.3 mg/dL (ref 8.9–10.3)
Creatinine, Ser: 1.71 mg/dL — ABNORMAL HIGH (ref 0.61–1.24)
GFR calc non Af Amer: 32 mL/min — ABNORMAL LOW (ref >60)
GFR, EST AFRICAN AMERICAN: 37 mL/min — AB (ref >60)
GLUCOSE: 107 mg/dL — AB (ref 70–99)
Potassium: 3.8 mmol/L (ref 3.5–5.1)
SODIUM: 133 mmol/L — AB (ref 135–145)

## 2018-04-26 LAB — CBC WITH DIFFERENTIAL/PLATELET
Abs Immature Granulocytes: 0.01 10*3/uL (ref 0.00–0.07)
BASOS PCT: 2 %
Basophils Absolute: 0.1 10*3/uL (ref 0.0–0.1)
EOS ABS: 0.6 10*3/uL — AB (ref 0.0–0.5)
EOS PCT: 9 %
HCT: 33.9 % — ABNORMAL LOW (ref 39.0–52.0)
HEMOGLOBIN: 10.7 g/dL — AB (ref 13.0–17.0)
Immature Granulocytes: 0 %
LYMPHS PCT: 33 %
Lymphs Abs: 2.1 10*3/uL (ref 0.7–4.0)
MCH: 27.9 pg (ref 26.0–34.0)
MCHC: 31.6 g/dL (ref 30.0–36.0)
MCV: 88.3 fL (ref 80.0–100.0)
MONO ABS: 0.6 10*3/uL (ref 0.1–1.0)
Monocytes Relative: 9 %
NRBC: 0 % (ref 0.0–0.2)
Neutro Abs: 2.9 10*3/uL (ref 1.7–7.7)
Neutrophils Relative %: 47 %
Platelets: 186 10*3/uL (ref 150–400)
RBC: 3.84 MIL/uL — AB (ref 4.22–5.81)
RDW: 13.9 % (ref 11.5–15.5)
WBC: 6.3 10*3/uL (ref 4.0–10.5)

## 2018-04-26 NOTE — Progress Notes (Signed)
[name] Kitchen   PROGRESS NOTE  [name] YNW:[id number] DOB: Apr 20, 1921 DOA: 04/23/2018 PCP: [name], MD  HPI/Recap of past 24 hours: [name] is a 84 y.o. male with medical history significant of prostate cancer, hypertension, CVA, thyroid disease presenting to the hospital for evaluation of bilateral lower extremity edema.  Patient was seen in the ED on November 17 and placed on Lasix.  He was asked to follow-up with his primary care doctor.  Patient states he went to his PCPs office and no new recommendations were made.  He reports having bilateral lower extremity edema for the past 2 weeks which has partially improved with Lasix.  Reports having pain in his lower extremities (left greater than right).  Denies having any shortness of breath.  Reports having a cough productive of little amount of phlegm.  Denies having any fevers or chills. In the ED, BNP normal.  Bilateral lower extremity Dopplers negative for DVT.  Chest x-ray showing moderate left pleural effusion with atelectasis.   Had L thoracentesis done on 04/24/18 by IR with 1.2L clear fluid removed.  Subjective:  Patient reports some cough with taking deep breaths with incentive spirometry, otherwise no complaints  Assessment/Plan: Principal Problem:   Bilateral lower extremity edema Active Problems:   Hypothyroidism   Seizure disorder (HCC)   Moderate sized pleural effusion   Hyponatremia   AKI (acute kidney injury) ([place])   CKD (chronic kidney disease) stage 3, GFR 30-59 ml/min (HCC)   Chronic anemia   Physical deconditioning   CVA (cerebral vascular accident) ([place])   Neuropathy   Anxiety   Dementia ([place])   Prostate cancer metastatic to bone (HCC)  B/L LE edema -2  D echo with evidence of grade 1 diastolic dysfunction, but other than leg edema there is no evidence of heart failure, so edema unlikely related to CHF, most likely due to this  Moderate L pleural effusion post thoracentesis 1.2L clear  removed by IR on 04/24/18 It looks exudative, given elevated LDH and protein count, but no evidence of infection, nontoxic looking, Gram stain's and cultures so far are negative, will need to follow on CT chest, it was done earlier today, but has not been read yet.  AKI on CKD 3, improving Baseline creatinine appears to be 1.4 with GFR of Creatinine today 1.66 from 1.84 Continue to avoid nephrotoxic agents Repeat BMP in the morning  Euvolemic hyponatremia Sodium 134 Fluid restriction Repeat chemistry panel in the morning  Chronic diastolic CHF Last 2D echo revealed LVEF 60 to 65% with grade 1 diastolic dysfunction Appears stable at this time  Uncontrolled hypertension Resume Lasix 20 mg daily (home dose) Holding losartan due to AKI Start amlodipine 10 mg daily Continue to monitor vital signs      Code Status: Full code  Family Communication: Discussed with son at bedside  Disposition Plan: Home possibly tomorrow 04/26/2018   Consultants:  None  Procedures:  Thoracentesis  Antimicrobials:  None  DVT prophylaxis: Subcu heparin 3 times daily   Objective: Vitals:   04/25/18 1503 04/25/18 2103 04/26/18 0611 04/26/18 1315  BP: (!) 175/91 (!) 168/93 137/85 (!) 154/86  Pulse: 66 71 71 82  Resp:  16 18 18   Temp:  97.8 F (36.6 C) 98.4 F (36.9 C) 98.6 F (37 C)  TempSrc:  Oral Oral Axillary  SpO2:  98% 99% 99%  Weight:      Height:        Intake/Output Summary (Last 24 hours) at 04/26/2018  [place]ane filed at 04/26/2018 1315 Gross per 24 hour  Intake 1260 ml  Output 375 ml  Net 885 ml   Filed Weights   04/24/18 0005  Weight: 72.6 kg    Exam:  Awake Alert, Oriented X 3, No new F.N deficits, Normal affect, frail, in no apparent distress Symmetrical Chest wall movement, diminished air entry in the left, some rales at the bases  RRR,No Gallops,Rubs or new Murmurs, No Parasternal Heave +ve B.Sounds, Abd Soft, No tenderness, No rebound -  guarding or rigidity. No Cyanosis, Clubbing or edema, No new Rash or bruise      Data Reviewed: CBC: Recent Labs  Lab 04/20/18 1003 04/23/18 1950 04/25/18 0544 04/26/18 0456  WBC 7.9 6.0 7.0 6.3  NEUTROABS 4.3 3.0  --  2.9  HGB 10.1* 9.9* 10.9* 10.7*  HCT 32.6* 31.6* 34.5* 33.9*  MCV 90.1 88.5 88.0 88.3  PLT 159 150 196 454   Basic Metabolic Panel: Recent Labs  Lab 04/20/18 1003 04/23/18 1950 04/24/18 0300 04/25/18 0316 04/26/18 0456  NA 136 132* 133* 134* 133*  K 3.3* 4.0 4.1 4.1 3.8  CL 103 98 99 100 99  CO2 25 25 27 26 26   GLUCOSE 107* 98 107* 110* 107*  BUN 14 18 17 19 15   CREATININE 1.48* 1.73* 1.84* 1.66* 1.71*  CALCIUM 9.6 9.4 9.5 9.3 9.3   GFR: Estimated Creatinine Clearance: 26.5 mL/min (A) (by C-G formula based on SCr of 1.71 mg/dL (H)). Liver Function Tests: Recent Labs  Lab 04/20/18 1003  AST 20  ALT 10  ALKPHOS 93  BILITOT 1.2  PROT 8.2*  ALBUMIN 4.4   No results for input(s): LIPASE, AMYLASE in the last 168 hours. No results for input(s): AMMONIA in the last 168 hours. Coagulation Profile: No results for input(s): INR, PROTIME in the last 168 hours. Cardiac Enzymes: No results for input(s): CKTOTAL, CKMB, CKMBINDEX, TROPONINI in the last 168 hours. BNP (last 3 results) No results for input(s): PROBNP in the last 8760 hours. HbA1C: No results for input(s): HGBA1C in the last 72 hours. CBG: No results for input(s): GLUCAP in the last 168 hours. Lipid Profile: No results for input(s): CHOL, HDL, LDLCALC, TRIG, CHOLHDL, LDLDIRECT in the last 72 hours. Thyroid Function Tests: Recent Labs    04/24/18 0300  TSH 8.377*   Anemia Panel: No results for input(s): VITAMINB12, FOLATE, FERRITIN, TIBC, IRON, RETICCTPCT in the last 72 hours. Urine analysis:    Component Value Date/Time   COLORURINE STRAW (A) 04/20/2018 1007   APPEARANCEUR HAZY (A) 04/20/2018 1007   LABSPEC 1.004 (L) 04/20/2018 1007   PHURINE 6.0 04/20/2018 1007   GLUCOSEU  NEGATIVE 04/20/2018 1007   HGBUR NEGATIVE 04/20/2018 1007   HGBUR negative 10/17/2007 0915   BILIRUBINUR NEGATIVE 04/20/2018 1007   KETONESUR NEGATIVE 04/20/2018 1007   PROTEINUR NEGATIVE 04/20/2018 1007   UROBILINOGEN 0.2 05/18/2014 1653   NITRITE NEGATIVE 04/20/2018 1007   LEUKOCYTESUR NEGATIVE 04/20/2018 1007   Sepsis Labs: @LABRCNTIP (procalcitonin:4,lacticidven:4)  ) Recent Results (from the past 240 hour(s))  Gram stain     Status: None   Collection Time: 04/24/18 12:28 PM  Result Value Ref Range Status   Specimen Description PLEURAL LEFT  Final   Special Requests NONE  Final   Gram Stain   Final    RARE WBC PRESENT, PREDOMINANTLY PMN NO ORGANISMS SEEN Performed at [facility], [place] [address] [place], [place] [address]    Report Status 04/24/2018 FINAL  Final  Culture, body fluid-bottle     Status: None (Preliminary result)   Collection Time: 04/24/18 12:28 PM  Result Value Ref Range Status   Specimen Description PLEURAL LEFT  Final   Special Requests NONE  Final   Culture   Final    NO GROWTH 2 DAYS Performed at [facility], [address] [place], [place] [address]    Report Status PENDING  Incomplete      Studies: No results found.  Scheduled Meds: . amLODipine  10 mg Oral Daily  . aspirin EC  81 mg Oral Daily  . clopidogrel  75 mg Oral Daily  . gabapentin  100 mg Oral QHS  . heparin injection (subcutaneous)  5,000 Units Subcutaneous Q8H  . levETIRAcetam  750 mg Oral BID  . levothyroxine  124.5 mcg Oral Q0600  . memantine  10 mg Oral BID    Continuous Infusions:   LOS: 2 days     Phillips Climes, MD [facility]  If 7PM-7AM, please contact night-coverage [email or web address] Password [facility] 04/26/2018, 3:43 PM

## 2018-04-27 ENCOUNTER — Encounter (HOSPITAL_COMMUNITY): Payer: Self-pay | Admitting: *Deleted

## 2018-04-27 DIAGNOSIS — C61 Malignant neoplasm of prostate: Secondary | ICD-10-CM

## 2018-04-27 DIAGNOSIS — C7951 Secondary malignant neoplasm of bone: Secondary | ICD-10-CM

## 2018-04-27 LAB — CBC
HCT: 37 % — ABNORMAL LOW (ref 39.0–52.0)
Hemoglobin: 11.4 g/dL — ABNORMAL LOW (ref 13.0–17.0)
MCH: 27.3 pg (ref 26.0–34.0)
MCHC: 30.8 g/dL (ref 30.0–36.0)
MCV: 88.5 fL (ref 80.0–100.0)
Platelets: 201 10*3/uL (ref 150–400)
RBC: 4.18 MIL/uL — AB (ref 4.22–5.81)
RDW: 13.9 % (ref 11.5–15.5)
WBC: 8.1 10*3/uL (ref 4.0–10.5)
nRBC: 0 % (ref 0.0–0.2)

## 2018-04-27 LAB — CD19 AND CD20, FLOW CYTOMETRY
% CD19: 8 %
% CD20: 8 %

## 2018-04-27 LAB — BASIC METABOLIC PANEL
Anion gap: 8 (ref 5–15)
BUN: 15 mg/dL (ref 8–23)
CO2: 27 mmol/L (ref 22–32)
Calcium: 9.5 mg/dL (ref 8.9–10.3)
Chloride: 100 mmol/L (ref 98–111)
Creatinine, Ser: 1.6 mg/dL — ABNORMAL HIGH (ref 0.61–1.24)
GFR calc non Af Amer: 35 mL/min — ABNORMAL LOW (ref >60)
GFR, EST AFRICAN AMERICAN: 41 mL/min — AB (ref >60)
Glucose, Bld: 105 mg/dL — ABNORMAL HIGH (ref 70–99)
POTASSIUM: 3.9 mmol/L (ref 3.5–5.1)
SODIUM: 135 mmol/L (ref 135–145)

## 2018-04-27 NOTE — Progress Notes (Signed)
[name] Kitchen   PROGRESS NOTE  [name] DGU:[id number] DOB: Oct 16, 1920 DOA: 04/23/2018 PCP: [name], MD  HPI/Recap of past 24 hours: [name] is a 84 y.o. male with medical history significant of prostate cancer, hypertension, CVA, thyroid disease presenting to the hospital for evaluation of bilateral lower extremity edema.  Patient was seen in the ED on November 17 and placed on Lasix.  He was asked to follow-up with his primary care doctor.  Patient states he went to his PCPs office and no new recommendations were made.  He reports having bilateral lower extremity edema for the past 2 weeks which has partially improved with Lasix.  Reports having pain in his lower extremities (left greater than right).  Denies having any shortness of breath.  Reports having a cough productive of little amount of phlegm.  Denies having any fevers or chills. In the ED, BNP normal.  Bilateral lower extremity Dopplers negative for DVT.  Chest x-ray showing moderate left pleural effusion with atelectasis.   Had L thoracentesis done on 04/24/18 by IR with 1.2L clear fluid removed.  Subjective:  Patient has any complaints today  Assessment/Plan: Principal Problem:   Bilateral lower extremity edema Active Problems:   Hypothyroidism   Seizure disorder (HCC)   Moderate sized pleural effusion   Hyponatremia   AKI (acute kidney injury) ([place])   CKD (chronic kidney disease) stage 3, GFR 30-59 ml/min (HCC)   Chronic anemia   Physical deconditioning   CVA (cerebral vascular accident) ([place])   Neuropathy   Anxiety   Dementia ([place])   Prostate cancer metastatic to bone (HCC)  B/L LE edema -2  D echo with evidence of grade 1 diastolic dysfunction, but other than leg edema there is no evidence of heart failure, so edema unlikely related to CHF, most likely due to this  Moderate L pleural effusion post thoracentesis 1.2L clear removed by IR on 04/24/18 It looks exudative, given elevated LDH and  protein count, but no evidence of infection, nontoxic looking, Gram stain's and cultures so far are negative, masses, it appears having some chronic changes in the left lung related to his pleural effusion but evidence of extensive metastatic disease most likely due to his prostate cancer. -Awaiting cytology results to determine if this is malignant effusion or not.  AKI on CKD 3, improving -Baseline creatinine 1.4, peaked at 1.8, trending down, it is 1.6 today, avoid nephrotoxic medication and monitor closely  Euvolemic hyponatremia Resolved  Metastatic  prostate cancer -Follow with urology Dr. Alinda Money, appears to be having metastatic disease given his NM bone scan 2017, which was confirmed with repeat CT chest currently -follow on cytology and pleural effusion,  Chronic diastolic CHF Last 2D echo revealed LVEF 60 to 65% with grade 1 diastolic dysfunction Appears stable at this time  Uncontrolled hypertension Resume Lasix 20 mg daily (home dose) Holding losartan due to AKI Start amlodipine 10 mg daily  Hypothyroidism -Continue with Synthroid      Code Status: Full code  Family Communication: Discussed with son at bedside  Disposition Plan: Home possibly tomorrow 04/26/2018   Consultants:  None  Procedures:  Thoracentesis  Antimicrobials:  None  DVT prophylaxis: Subcu heparin 3 times daily   Objective: Vitals:   04/26/18 0611 04/26/18 1315 04/26/18 2133 04/27/18 0427  BP: 137/85 (!) 154/86 (!) 151/93 (!) 160/83  Pulse: 71 82 77 72  Resp: 18 18 16 18   Temp: 98.4 F (36.9 C) 98.6 F (37 C) 97.7 F (36.5 C) (!)  97.4 F (36.3 C)  TempSrc: Oral Axillary Oral Oral  SpO2: 99% 99% 98% 98%  Weight:      Height:        Intake/Output Summary (Last 24 hours) at 04/27/2018 1357 Last data filed at 04/27/2018 1033 Gross per 24 hour  Intake 237 ml  Output -  Net 237 ml   Filed Weights   04/24/18 0005  Weight: 72.6 kg    Exam:  Awake Alert, Oriented X  3, No new F.N deficits, frail, pleasant Symmetrical Chest wall movement, air entry with rales in the left side RRR,No Gallops,Rubs or new Murmurs, No Parasternal Heave +ve B.Sounds, Abd Soft, No tenderness, No rebound - guarding or rigidity. No Cyanosis, Clubbing or edema, No new Rash or bruise       Data Reviewed: CBC: Recent Labs  Lab 04/23/18 1950 04/25/18 0544 04/26/18 0456 04/27/18 0324  WBC 6.0 7.0 6.3 8.1  NEUTROABS 3.0  --  2.9  --   HGB 9.9* 10.9* 10.7* 11.4*  HCT 31.6* 34.5* 33.9* 37.0*  MCV 88.5 88.0 88.3 88.5  PLT 150 196 186 161   Basic Metabolic Panel: Recent Labs  Lab 04/23/18 1950 04/24/18 0300 04/25/18 0316 04/26/18 0456 04/27/18 0324  NA 132* 133* 134* 133* 135  K 4.0 4.1 4.1 3.8 3.9  CL 98 99 100 99 100  CO2 25 27 26 26 27   GLUCOSE 98 107* 110* 107* 105*  BUN 18 17 19 15 15   CREATININE 1.73* 1.84* 1.66* 1.71* 1.60*  CALCIUM 9.4 9.5 9.3 9.3 9.5   GFR: Estimated Creatinine Clearance: 28.4 mL/min (A) (by C-G formula based on SCr of 1.6 mg/dL (H)). Liver Function Tests: No results for input(s): AST, ALT, ALKPHOS, BILITOT, PROT, ALBUMIN in the last 168 hours. No results for input(s): LIPASE, AMYLASE in the last 168 hours. No results for input(s): AMMONIA in the last 168 hours. Coagulation Profile: No results for input(s): INR, PROTIME in the last 168 hours. Cardiac Enzymes: No results for input(s): CKTOTAL, CKMB, CKMBINDEX, TROPONINI in the last 168 hours. BNP (last 3 results) No results for input(s): PROBNP in the last 8760 hours. HbA1C: No results for input(s): HGBA1C in the last 72 hours. CBG: No results for input(s): GLUCAP in the last 168 hours. Lipid Profile: No results for input(s): CHOL, HDL, LDLCALC, TRIG, CHOLHDL, LDLDIRECT in the last 72 hours. Thyroid Function Tests: No results for input(s): TSH, T4TOTAL, FREET4, T3FREE, THYROIDAB in the last 72 hours. Anemia Panel: No results for input(s): VITAMINB12, FOLATE, FERRITIN, TIBC,  IRON, RETICCTPCT in the last 72 hours. Urine analysis:    Component Value Date/Time   COLORURINE STRAW (A) 04/20/2018 1007   APPEARANCEUR HAZY (A) 04/20/2018 1007   LABSPEC 1.004 (L) 04/20/2018 1007   PHURINE 6.0 04/20/2018 1007   GLUCOSEU NEGATIVE 04/20/2018 1007   HGBUR NEGATIVE 04/20/2018 1007   HGBUR negative 10/17/2007 0915   BILIRUBINUR NEGATIVE 04/20/2018 1007   KETONESUR NEGATIVE 04/20/2018 1007   PROTEINUR NEGATIVE 04/20/2018 1007   UROBILINOGEN 0.2 05/18/2014 1653   NITRITE NEGATIVE 04/20/2018 1007   LEUKOCYTESUR NEGATIVE 04/20/2018 1007   Sepsis Labs: @LABRCNTIP (procalcitonin:4,lacticidven:4)  ) Recent Results (from the past 240 hour(s))  Gram stain     Status: None   Collection Time: 04/24/18 12:28 PM  Result Value Ref Range Status   Specimen Description PLEURAL LEFT  Final   Special Requests NONE  Final   Gram Stain   Final    RARE WBC PRESENT, PREDOMINANTLY PMN NO ORGANISMS SEEN Performed  at [facility], [place] [address], [place], [place] [address]    Report Status 04/24/2018 FINAL  Final  Culture, body fluid-bottle     Status: None (Preliminary result)   Collection Time: 04/24/18 12:28 PM  Result Value Ref Range Status   Specimen Description PLEURAL LEFT  Final   Special Requests NONE  Final   Culture   Final    NO GROWTH 2 DAYS Performed at [facility] [address], [place], [place] [address]    Report Status PENDING  Incomplete      Studies: No results found.  Scheduled Meds: . amLODipine  10 mg Oral Daily  . aspirin EC  81 mg Oral Daily  . clopidogrel  75 mg Oral Daily  . gabapentin  100 mg Oral QHS  . heparin injection (subcutaneous)  5,000 Units Subcutaneous Q8H  . levETIRAcetam  750 mg Oral BID  . levothyroxine  124.5 mcg Oral Q0600  . memantine  10 mg Oral BID    Continuous Infusions:   LOS: 3 days     Phillips Climes, MD [facility]  If 7PM-7AM, please contact night-coverage [email or web address] Password  [email or web address] 04/27/2018, 1:57 PM

## 2018-04-28 MED ORDER — ONDANSETRON HCL 4 MG PO TABS: 4.0000 mg | ORAL_TABLET | Freq: Three times a day (TID) | ORAL | 0 refills | Status: AC | PRN

## 2018-04-28 MED ORDER — AMLODIPINE BESYLATE 10 MG PO TABS: 10.0000 mg | ORAL_TABLET | Freq: Every day | ORAL | 0 refills | Status: AC

## 2018-04-28 MED ORDER — FUROSEMIDE 20 MG PO TABS: 20.0000 mg | ORAL_TABLET | Freq: Every day | ORAL | 0 refills | Status: AC

## 2018-04-28 NOTE — Progress Notes (Signed)
Physical Therapy Treatment Patient Details Name: [name] MRN: [id number] DOB: 1920-08-01 Today's Date: 04/28/2018    History of Present Illness Patient is a 84 y/o male presenting to the ED with primary complaints of B LE swelling and pain. Bilateral lower extremity Dopplers negative for DVT.  Chest x-ray showing moderate left pleural effusion with atelectasis. PMH significant for prostate cancer, dementia, hypertension, CVA, thyroid disease.    PT Comments    Patient seen for mobility progression. Pt continues to demonstrate impaired balance and will benefit from using cane and having supervision/assistance for OOB mobility for decreased risk for falls. Recommendations discussed with pt and pt's son and they are in agreement.  Current plan remains appropriate.   Follow Up Recommendations  Home health PT;Supervision/Assistance - 24 hour     Equipment Recommendations  None recommended by PT    Recommendations for Other Services       Precautions / Restrictions Precautions Precautions: Fall    Mobility  Bed Mobility               General bed mobility comments: pt OOB in chair upon arrival  Transfers Overall transfer level: Needs assistance Equipment used: Straight cane Transfers: Sit to/from Stand Sit to Stand: Min guard         General transfer comment: min guard for safety; pt stood with hands on arm rests and then used SPC upon standing   Ambulation/Gait Ambulation/Gait assistance: Min guard;Min Web designer (Feet): 200 Feet Assistive device: Straight cane Gait Pattern/deviations: Step-through pattern;Decreased stride length;Trunk flexed Gait velocity: decreased   General Gait Details: pt unsteady especially initial 50 ft; cues for forward gaze, increased cadence, and safe use of AD; pt grossly requires min guard for safety and demonstrates improved balance with increased cadence and use of single UE support    Stairs              Wheelchair Mobility    Modified Rankin (Stroke Patients Only)       Balance Overall balance assessment: Needs assistance   Sitting balance-Leahy Scale: Good     Standing balance support: Single extremity supported Standing balance-Leahy Scale: Poor                              Cognition Arousal/Alertness: Awake/alert Behavior During Therapy: WFL for tasks assessed/performed Overall Cognitive Status: Within Functional Limits for tasks assessed                                 General Comments: Pt seemed upset and not speaking much end of session and son reports "he is mad at me. not you"      Exercises      General Comments General comments (skin integrity, edema, etc.): son present and educated that pt will need supervision/assist upon d/c and that pt benefits from using [facility] which he already has at home per pt and son      Pertinent Vitals/Pain Pain Assessment: No/denies pain    Home Living                      Prior Function            PT Goals (current goals can now be found in the care plan section) Acute Rehab PT Goals Patient Stated Goal: return home PT Goal Formulation: With patient Time For Goal  Achievement: 05/08/18 Potential to Achieve Goals: Good Progress towards PT goals: Progressing toward goals    Frequency    Min 3X/week      PT Plan Current plan remains appropriate    Co-evaluation              AM-PAC PT "6 Clicks" Mobility   Outcome Measure  Help needed turning from your back to your side while in a flat bed without using bedrails?: A Little Help needed moving from lying on your back to sitting on the side of a flat bed without using bedrails?: A Little Help needed moving to and from a bed to a chair (including a wheelchair)?: A Little Help needed standing up from a chair using your arms (e.g., wheelchair or bedside chair)?: A Little Help needed to walk in hospital room?: A Little Help  needed climbing 3-5 steps with a railing? : A Little 6 Click Score: 18    End of Session Equipment Utilized During Treatment: Gait belt Activity Tolerance: Patient tolerated treatment well Patient left: with call bell/phone within reach;with family/visitor present;in chair Nurse Communication: Mobility status PT Visit Diagnosis: Unsteadiness on feet (R26.81);Other abnormalities of gait and mobility (R26.89)     Time: 8550-1586 PT Time Calculation (min) (ACUTE ONLY): 12 min  Charges:  $Gait Training: 8-22 mins                     Earney Navy, PTA Acute Rehabilitation Services Pager: [phone] Office: [phone]     Darliss Cheney 04/28/2018, 1:03 PM

## 2018-04-28 NOTE — Progress Notes (Signed)
Pt for discharge going home , family at the bedside, discontinue peripheral IV line, given all  His personal belongings, given health teachings, next appointment, due med explained and understood, prescriptions, no complain of pain at this time, he ambulates in the hallway tolerates well.

## 2018-04-28 NOTE — Care Management Note (Signed)
Case Management Note  Patient Details  Name: [name] MRN: [id number] Date of Birth: February 06, 1921  Subjective/Objective:                    Action/Plan:   Expected Discharge Date:  04/28/18               Expected Discharge Plan:  [place]  In-House Referral:  NA  Discharge planning Services  CM Consult  Post Acute Care Choice:  Home Health Choice offered to:  Patient  DME Arranged:  N/A DME Agency:  NA  HH Arranged:  PT, RN, Social Work, Nurse's Aide [place] Agency:  [place]  Status of Service:  Completed, signed off  If discussed at [employer], dates discussed:    Additional Comments:  Marilu Favre, RN 04/28/2018, 1:47 PM

## 2018-04-28 NOTE — Discharge Summary (Signed)
[name], is a 84 y.o. male  DOB 06/05/1920  MRN [id number].  Admission date:  04/23/2018  Admitting Physician  Shela Leff, MD  Discharge Date:  04/28/2018   Primary MD  Todd Evens, MD  Recommendations for primary care physician for things to follow:  -Please repeat two-view chest x-ray in 3 to 4 weeks to ensure no recurrence of pleural effusion   Admission Diagnosis  Peripheral edema [R60.9] Pleural effusion [J90] Bilateral lower extremity edema [R60.0]   Discharge Diagnosis  Peripheral edema [R60.9] Pleural effusion [J90] Bilateral lower extremity edema [R60.0]    Principal Problem:   Bilateral lower extremity edema Active Problems:   Hypothyroidism   Seizure disorder (HCC)   Moderate sized pleural effusion   Hyponatremia   AKI (acute kidney injury) ([name])   CKD (chronic kidney disease) stage 3, GFR 30-59 ml/min (HCC)   Chronic anemia   Physical deconditioning   CVA (cerebral vascular accident) ([name])   Neuropathy   Anxiety   Dementia ([name])   Prostate cancer metastatic to bone [name], [name])      Past Medical History:  Diagnosis Date  . Cancer [facility]) 2014   prostate  . Dementia ([place])   . HTN (hypertension)   . Seizures ([place])   . Stroke [facility]) 2014   TIA  . Thyroid disease     Past Surgical History:  Procedure Laterality Date  . BACK SURGERY     cervical spine  . COLONOSCOPY     at [facility], polyp per wife  . CYSTOSCOPY N/A 12/25/2012   Procedure: CYSTOSCOPY FLEXIBLE;  Surgeon: Marissa Nestle, MD;  Location: [facility] ORS;  Service: Urology;  Laterality: N/A;  . ESOPHAGOGASTRODUODENOSCOPY (EGD) WITH ESOPHAGEAL DILATION N/A 12/27/2012   Procedure: ESOPHAGOGASTRODUODENOSCOPY (EGD) WITH ESOPHAGEAL DILATION;  Surgeon: Danie Binder, MD;  Location: [facility] ENDO SUITE;  Service: Endoscopy;  Laterality: N/A;  . HERNIA REPAIR     x 2  . IR THORACENTESIS ASP PLEURAL SPACE W/IMG GUIDE   04/24/2018  . PROSTATE SURGERY         History of present illness and  Hospital Course:     Kindly see H&P for history of present illness and admission details, please review complete Labs, Consult reports and Test reports for all details in brief  HPI  from the history and physical done on the day of admission 04/23/2018  HPI: [name] is a 84 y.o. male with medical history significant of prostate cancer, dementia, hypertension, CVA, thyroid disease presenting to the hospital for evaluation of bilateral lower extremity edema.  Patient was seen in the ED on November 17 and placed on Lasix.  He was asked to follow-up with his primary care doctor.  Patient states he went to his PCPs office and no new recommendations were made.  He reports having bilateral lower extremity edema for the past 2 weeks which has partially improved with Lasix.  Reports having pain in his lower extremities (left greater than right).  Denies having any shortness of breath.  Reports having a cough productive of little amount of phlegm.  Denies having any fevers or chills.  ED Course: Afebrile, not tachycardic, and not hypotensive.  No leukocytosis.  BNP normal.  Sodium 132.  Creatinine 1.7, was 1.4 three days ago.  Bilateral lower extremity Dopplers negative for DVT.  Chest x-ray showing moderate left pleural effusion with atelectasis; no significant interval change compared with the prior exam.   [place] a 84 y.o.malewith medical history significant ofprostate cancer, hypertension,CVA, thyroid disease presenting to the hospital for evaluation of bilateral lower extremity edema. Patient was seen in the ED on November 17 and placed on Lasix. He was asked to follow-up with his primary care doctor.Patient states he went to his PCPs office and no new recommendations were made. He reports having bilateral lower extremity edema for the past 2 weeks which has partially improved  with Lasix. Reports having pain in his lower extremities (left greater than right). Denies having any shortness of breath. Reports having a cough productive of little amount of phlegm. Denies having any fevers or chills. In the ED, BNP normal.Bilateral lower extremity Dopplers negative for DVT. Chest x-ray showing moderate left pleural effusion , with atelectasis, status post thoracentesis, work-up significant for exudate, she came back for significant malignant cells.   Malignant L pleural effusion post thoracentesis 1.2L clear removed by IR on 04/24/18 It looks exudative, given elevated LDH and protein count, but no evidence of infection, nontoxic looking, Gram stain's and cultures so far are negative, CT was obtained, no evidence of nodules or masses, it appears having some chronic changes in the left lung related to his pleural effusion as well evidence of extensive metastatic disease to bone, which is known due to prostate cancer . -Pleural effusion cytology came back significant for adenocarcinoma, prelim reading as discussed with pathology, further still pending to determine source, but I think this is most likely related to his metastatic prostate cancer, I have relayed the results to the son and the patient. -Patient encouraged to use incentive spirometry on discharge, repeat two-view chest x-ray in 2 to 3 weeks most likely due to his prostate cancer.   AKI on CKD 3, improving -Baseline creatinine 1.4, peaked at 1.8, trending down, down to 1.6 on discharge, losartan has been held on discharge .  B/L LE edema -2  D echo with evidence of grade 1 diastolic dysfunction, but other than leg edema there is no evidence of heart failure, so edema unlikely related to CHF, most likely due to venostasis.  Euvolemic hyponatremia Resolved  Metastatic  prostate cancer -Follow with urology Dr. Alinda Money, appears to be having metastatic disease given his NM bone scan 2017, which was confirmed  with repeat CT chest currently -There is effusion cytology confirms adenocarcinoma, will forward discharge summary to Dr. Alinda Money  Chronic diastolic CHF Last 2D echo revealed LVEF 60 to 65% with grade 1 diastolic dysfunction Appears stable at this time  Uncontrolled hypertension Resume Lasix 20 mg daily (home dose) Stopped losartan due to AKI Start amlodipine 10 mg daily  Hypothyroidism -Continue with Synthroid      Discharge Condition:  Stable   Follow UP  [place] Follow up.   Contact information: [place] [address] [phone]        Todd Evens, MD Follow up in 1 week(s).   Specialty:  Family Medicine Contact information: [place] [address]  [phone]             Discharge Instructions  and  Discharge Medications    Discharge Instructions    Discharge instructions   Complete by:  As directed    Follow with Primary MD Todd Evens, MD in 7 days   Get CBC, CMP, 2 view Chest X ray checked  by Primary MD next visit.    Activity: As tolerated with Full fall precautions use walker/cane & assistance as needed   Disposition Home   Diet: Regular diet , with feeding assistance and aspiration precautions.  For Heart failure patients - Check your Weight same time everyday, if you gain over 2 pounds, or you develop in leg swelling, experience more shortness of breath or chest pain, call your Primary MD immediately. Follow Cardiac Low Salt Diet and 1.5 lit/day fluid restriction.   On your next visit with your primary care physician please Get Medicines reviewed and adjusted.   Please request your Prim.MD to go over all Hospital Tests and Procedure/Radiological results at the follow up, please get all Hospital records sent to your Prim MD by signing hospital release before you go home.   If you experience worsening of your admission symptoms,  develop shortness of breath, life threatening emergency, suicidal or homicidal thoughts you must seek medical attention immediately by calling 911 or calling your MD immediately  if symptoms less severe.  You Must read complete instructions/literature along with all the possible adverse reactions/side effects for all the Medicines you take and that have been prescribed to you. Take any new Medicines after you have completely understood and accpet all the possible adverse reactions/side effects.   Do not drive, operating heavy machinery, perform activities at heights, swimming or participation in water activities or provide baby sitting services if your were admitted for syncope or siezures until you have seen by Primary MD or a Neurologist and advised to do so again.  Do not drive when taking Pain medications.    Do not take more than prescribed Pain, Sleep and Anxiety Medications  Special Instructions: If you have smoked or chewed Tobacco  in the last 2 yrs please stop smoking, stop any regular Alcohol  and or any Recreational drug use.  Wear Seat belts while driving.   Please note  You were cared for by a hospitalist during your hospital stay. If you have any questions about your discharge medications or the care you received while you were in the hospital after you are discharged, you can call the unit and asked to speak with the hospitalist on call if the hospitalist that took care of you is not available. Once you are discharged, your primary care physician will handle any further medical issues. Please note that NO REFILLS for any discharge medications will be authorized once you are discharged, as it is imperative that you return to your primary care physician (or establish a relationship with a primary care physician if you do not have one) for your aftercare needs so that they can reassess your need for medications and monitor your lab values.   Increase activity slowly   Complete by:  As  directed      Allergies as of 04/28/2018      Reactions   Aspirin    REACTION: "elevated pulse". Cannot take any strength higher than 81mg       Medication List    STOP taking these medications   losartan 100 MG tablet Commonly known as:  COZAAR  TAKE these medications   amLODipine 10 MG tablet Commonly known as:  NORVASC Take 1 tablet (10 mg total) by mouth daily. Start taking on:  04/29/2018   aspirin EC 81 MG tablet Take 81 mg by mouth daily.   clopidogrel 75 MG tablet Commonly known as:  PLAVIX Take 75 mg by mouth daily.   furosemide 20 MG tablet Commonly known as:  LASIX Take 1 tablet (20 mg total) by mouth daily. What changed:  when to take this   gabapentin 100 MG capsule Commonly known as:  NEURONTIN Take 1 capsule (100 mg total) by mouth at bedtime.   levETIRAcetam 750 MG tablet Commonly known as:  KEPPRA Take 1 tablet (750 mg total) by mouth 2 (two) times daily.   levothyroxine 112 MCG tablet Commonly known as:  SYNTHROID, LEVOTHROID Take 112 mcg by mouth 2 (two) times daily.   LORazepam 0.5 MG tablet Commonly known as:  ATIVAN Take 1 tablet (0.5 mg total) by mouth 2 (two) times daily as needed for anxiety.   memantine 10 MG tablet Commonly known as:  NAMENDA Take 10 mg by mouth 2 (two) times daily.   ondansetron 4 MG tablet Commonly known as:  ZOFRAN Take 1 tablet (4 mg total) by mouth every 8 (eight) hours as needed for nausea or vomiting. What changed:    when to take this  reasons to take this   potassium chloride 10 MEQ tablet Commonly known as:  K-DUR Take 1 tablet (10 mEq total) by mouth 2 (two) times daily. What changed:  Another medication with the same name was removed. Continue taking this medication, and follow the directions you see here.         Diet and Activity recommendation: See Discharge Instructions above   Consults obtained -  None   Major procedures and Radiology Reports - PLEASE review detailed and  final reports for all details, in brief -      Dg Chest 1 View  Result Date: 04/24/2018 CLINICAL DATA:  Post LEFT thoracentesis EXAM: CHEST  1 VIEW COMPARISON:  04/23/2018 FINDINGS: Mild decrease in LEFT pleural effusion and atelectasis post thoracentesis. Persistent moderate LEFT pleural effusion and atelectasis. No pneumothorax following thoracentesis. Stable heart size. RIGHT lung clear. Small nodular focus identified in the LEFT mid lung, at 7 mm diameter, question pulmonary nodule versus sclerotic focus within the anterior LEFT fourth rib. Bones demineralized with BILATERAL chronic rotator cuff tears. IMPRESSION: Decrease in LEFT pleural effusion and basilar atelectasis post thoracentesis without pneumothorax. Question 7 mm LEFT mid lung nodule versus sclerotic focus in the anterior LEFT fourth rib; recommend follow-up CT chest assessment. Electronically Signed   By: Lavonia Dana M.D.   On: 04/24/2018 12:36   Dg Chest 2 View  Result Date: 04/20/2018 CLINICAL DATA:  Lower leg swelling x2 weeks EXAM: CHEST - 2 VIEW COMPARISON:  01/11/2014 FINDINGS: Moderate to large left pleural effusion. Associated lower lobe atelectasis. Right lung is clear. The heart is normal in size. Sclerotic foci involving the right acromion and at least two left ribs, possibly reflecting osseus metastases. IMPRESSION: Moderate to large left pleural effusion. Possible sclerotic osseous metastases, better evaluated on prior bone scan. Electronically Signed   By: Julian Hy M.D.   On: 04/20/2018 10:55   Dg Chest Port 1 View  Result Date: 04/23/2018 CLINICAL DATA:  Bilateral lower extremity swelling for 2 weeks EXAM: PORTABLE CHEST 1 VIEW COMPARISON:  04/20/2018 FINDINGS: Moderate left pleural effusion with atelectasis. No right pleural effusion.  No pneumothorax. Stable cardiomediastinal silhouette. No acute osseous abnormality. IMPRESSION: Moderate left pleural effusion with atelectasis. No significant interval change  compared with the prior exam. Electronically Signed   By: Kathreen Devoid   On: 04/23/2018 19:34   Vas [place] Lower Extremity Venous (dvt) (mc And Wl 7a-7p)  Result Date: 04/24/2018  Lower Venous Study Indications: Swelling.  Performing Technologist: Maudry Mayhew MHA, RDMS, RVT, RDCS  Examination Guidelines: A complete evaluation includes B-mode imaging, spectral Doppler, color Doppler, and power Doppler as needed of all accessible portions of each vessel. Bilateral testing is considered an integral part of a complete examination. Limited examinations for reoccurring indications may be performed as noted.  Right Venous Findings: +---------+---------------+---------+-----------+----------+-------------------+          CompressibilityPhasicitySpontaneityPropertiesSummary             +---------+---------------+---------+-----------+----------+-------------------+ CFV      Full           Yes      Yes                                      +---------+---------------+---------+-----------+----------+-------------------+ SFJ      Full                                                             +---------+---------------+---------+-----------+----------+-------------------+ FV Prox  Full                                                             +---------+---------------+---------+-----------+----------+-------------------+ FV Mid   Full                                                             +---------+---------------+---------+-----------+----------+-------------------+ FV DistalFull                                                             +---------+---------------+---------+-----------+----------+-------------------+ PFV      Full                                                             +---------+---------------+---------+-----------+----------+-------------------+ POP      Full           Yes      Yes                                       +---------+---------------+---------+-----------+----------+-------------------+ PTV  Full                                                             +---------+---------------+---------+-----------+----------+-------------------+ PERO                                                  Unable to                                                                 adequately                                                                visualize           +---------+---------------+---------+-----------+----------+-------------------+  Left Venous Findings: +---------+---------------+---------+-----------+----------+-------------------+          CompressibilityPhasicitySpontaneityPropertiesSummary             +---------+---------------+---------+-----------+----------+-------------------+ CFV      Full           Yes      Yes                                      +---------+---------------+---------+-----------+----------+-------------------+ SFJ      Full                                                             +---------+---------------+---------+-----------+----------+-------------------+ FV Prox  Full                                                             +---------+---------------+---------+-----------+----------+-------------------+ FV Mid   Full                                                             +---------+---------------+---------+-----------+----------+-------------------+ FV DistalFull                                                             +---------+---------------+---------+-----------+----------+-------------------+  PFV      Full                                                             +---------+---------------+---------+-----------+----------+-------------------+ POP      Full           Yes      Yes                                       +---------+---------------+---------+-----------+----------+-------------------+ PTV      Full                                                             +---------+---------------+---------+-----------+----------+-------------------+ PERO                                                  Unable to                                                                 adequately                                                                visualize           +---------+---------------+---------+-----------+----------+-------------------+    Summary: Right: There is no evidence of deep vein thrombosis in the lower extremity. However, portions of this examination were limited- see technologist comments above. No cystic structure found in the popliteal fossa. Left: There is no evidence of deep vein thrombosis in the lower extremity. However, portions of this examination were limited- see technologist comments above. No cystic structure found in the popliteal fossa.  *See table(s) above for measurements and observations. Electronically signed by Todd Martinez MD on 04/24/2018 at 3:35:32 PM.    Final    Ir Thoracentesis Asp Pleural Space W/img Guide  Result Date: 04/24/2018 INDICATION: History of prostate cancer. Lower extremity edema. Left pleural effusion. Request for diagnostic and therapeutic thoracentesis. EXAM: ULTRASOUND GUIDED LEFT THORACENTESIS MEDICATIONS: 1% lidocaine 10 mL COMPLICATIONS: None immediate. PROCEDURE: An ultrasound guided thoracentesis was thoroughly discussed with the patient and questions answered. The benefits, risks, alternatives and complications were also discussed. The patient understands and wishes to proceed with the procedure. Written consent was obtained. Ultrasound was performed to localize and mark an adequate pocket of fluid in the left chest. The area was then prepped and draped in the normal sterile fashion. 1% Lidocaine was used for local  anesthesia. Under  ultrasound guidance a 6 Fr Safe-T-Centesis catheter was introduced. Thoracentesis was performed. The catheter was removed and a dressing applied. FINDINGS: A total of approximately 1.2 L of clear yellow fluid was removed. Samples were sent to the laboratory as requested by the clinical team. IMPRESSION: Successful ultrasound guided left thoracentesis yielding 1.2 L of pleural fluid. Postprocedure chest x-ray shows a decrease in the left pleural effusion without pneumothorax and question 7 mm left mid lung nodule versus sclerotic focus in the anterior left fourth rib; recommend follow-up CT chest assessment. Read by: Todd Eagle, PA-C Electronically Signed   By: [name] M.D.   On: 04/24/2018 13:43   Ct Chest Nodule Follow Up Low Dose W/o  Result Date: 04/26/2018 CLINICAL DATA:  84 year old male with history of pulmonary nodule. Followup study. EXAM: CT CHEST WITHOUT CONTRAST TECHNIQUE: Multidetector CT imaging of the chest was performed following the standard protocol without IV contrast. COMPARISON:  No priors.  Chest x-ray 04/24/2018. FINDINGS: Cardiovascular: Heart size is normal. There is no significant pericardial fluid, thickening or pericardial calcification. There is aortic atherosclerosis, as well as atherosclerosis of the great vessels of the mediastinum and the coronary arteries, including calcified atherosclerotic plaque in the left anterior descending and left circumflex coronary arteries. Ectasia of ascending thoracic aorta (4.2 cm in diameter). Ectasia of proximal descending thoracic aorta measuring up to 4.2 cm in diameter also noted. Mediastinum/Nodes: No pathologically enlarged mediastinal or hilar lymph nodes. Please note that accurate exclusion of hilar adenopathy is limited on noncontrast CT scans. Esophagus is unremarkable in appearance. No axillary lymphadenopathy. Lungs/Pleura: The previously suspected pulmonary nodule appears to correspond to a sclerotic rib lesion.  No definite suspicious appearing pulmonary nodules or masses are noted. The only pulmonary nodule is in the right middle lobe measuring 3 mm (axial image 182 of series 3). Moderate left pleural effusion. Patchy areas of ground-glass attenuation and septal thickening are noted in the left lung. Right lung is well aerated. Upper Abdomen: Aortic atherosclerosis. Musculoskeletal: Widespread sclerotic lesions throughout the visualized axial and appendicular skeleton, indicative of metastatic disease to the bones, largest of which is in the right scapula measuring up to 2.8 cm (axial image 10 of series 3). IMPRESSION: 1. No definite suspicious pulmonary nodules or masses. 2. Sclerotic lesions throughout the visualized axial and appendicular skeleton, indicative of widespread metastatic disease to the bones. Correlation with PSA level is recommended. 3. Moderate left pleural effusion. Patchy multifocal ground-glass attenuation and interstitial prominence in the left lung may suggest multilobar bronchopneumonia, or may be chronic. 4. Aortic atherosclerosis, in addition to left anterior descending and left circumflex coronary artery disease. 5. There is also ectasia of the ascending and proximal descending thoracic aorta which measures up to 4.2 cm in diameter. Aortic Atherosclerosis (ICD10-I70.0). Electronically Signed   By: Vinnie Langton M.D.   On: 04/26/2018 16:20    Micro Results     Recent Results (from the past 240 hour(s))  Gram stain     Status: None   Collection Time: 04/24/18 12:28 PM  Result Value Ref Range Status   Specimen Description PLEURAL LEFT  Final   Special Requests NONE  Final   Gram Stain   Final    RARE WBC PRESENT, PREDOMINANTLY PMN NO ORGANISMS SEEN Performed at [facility], [address], [place], [place] [address]    Report Status 04/24/2018 FINAL  Final  Culture, body fluid-bottle     Status: None (Preliminary result)   Collection Time: 04/24/18 12:28 PM  Result  Value Ref Range Status   Specimen Description PLEURAL LEFT  Final   Special Requests NONE  Final   Culture   Final    NO GROWTH 4 DAYS Performed at [facility], [address], [place], [place] [address]    Report Status PENDING  Incomplete       Today   Subjective:   Machael Raine today has no headache,no chest or  abdominal pain,no new weakness tingling or numbness, he still reports some cough  Objective:   Blood pressure 127/88, pulse 79, temperature 98.4 F (36.9 C), temperature source Oral, resp. rate 18, height 5\' 10"  (1.778 m), weight 72.6 kg, SpO2 98 %.   Intake/Output Summary (Last 24 hours) at 04/28/2018 1242 Last data filed at 04/28/2018 1200 Gross per 24 hour  Intake 360 ml  Output -  Net 360 ml    Exam Awake Alert, Oriented x 3, No new F.N deficits, Normal affect Symmetrical Chest wall movement, air entry in the left lung base, with some rails RRR,No Gallops,Rubs or new Murmurs, No Parasternal Heave +ve B.Sounds, Abd Soft, Non tender, , No rebound -guarding or rigidity. No Cyanosis, Clubbing or edema, No new Rash or bruise  Data Review   CBC w Diff:  Lab Results  Component Value Date   WBC 8.1 04/27/2018   HGB 11.4 (L) 04/27/2018   HCT 37.0 (L) 04/27/2018   PLT 201 04/27/2018   LYMPHOPCT 33 04/26/2018   MONOPCT 9 04/26/2018   EOSPCT 9 04/26/2018   BASOPCT 2 04/26/2018    CMP:  Lab Results  Component Value Date   NA 135 04/27/2018   K 3.9 04/27/2018   CL 100 04/27/2018   CO2 27 04/27/2018   BUN 15 04/27/2018   CREATININE 1.60 (H) 04/27/2018   PROT 8.2 (H) 04/20/2018   ALBUMIN 4.4 04/20/2018   BILITOT 1.2 04/20/2018   ALKPHOS 93 04/20/2018   AST 20 04/20/2018   ALT 10 04/20/2018  .   Total Time in preparing paper work, data evaluation and todays exam - 6 minutes  Phillips Climes M.D on 04/28/2018 at 12:42 PM  [facility]   Office  [phone]

## 2018-04-28 NOTE — Discharge Instructions (Signed)
Follow with Primary MD [name], MD in 7 days   Get CBC, CMP, 2 view Chest X ray checked  by Primary MD next visit.    Activity: As tolerated with Full fall precautions use walker/cane & assistance as needed   Disposition Home   Diet: Regular diet , with feeding assistance and aspiration precautions.  For Heart failure patients - Check your Weight same time everyday, if you gain over 2 pounds, or you develop in leg swelling, experience more shortness of breath or chest pain, call your Primary MD immediately. Follow Cardiac Low Salt Diet and 1.5 lit/day fluid restriction.   On your next visit with your primary care physician please Get Medicines reviewed and adjusted.   Please request your Prim.MD to go over all Hospital Tests and Procedure/Radiological results at the follow up, please get all Hospital records sent to your Prim MD by signing hospital release before you go home.   If you experience worsening of your admission symptoms, develop shortness of breath, life threatening emergency, suicidal or homicidal thoughts you must seek medical attention immediately by calling 911 or calling your MD immediately  if symptoms less severe.  You Must read complete instructions/literature along with all the possible adverse reactions/side effects for all the Medicines you take and that have been prescribed to you. Take any new Medicines after you have completely understood and accpet all the possible adverse reactions/side effects.   Do not drive, operating heavy machinery, perform activities at heights, swimming or participation in water activities or provide baby sitting services if your were admitted for syncope or siezures until you have seen by Primary MD or a Neurologist and advised to do so again.  Do not drive when taking Pain medications.    Do not take more than prescribed Pain, Sleep and Anxiety Medications  Special Instructions: If you have smoked or chewed Tobacco  in the  last 2 yrs please stop smoking, stop any regular Alcohol  and or any Recreational drug use.  Wear Seat belts while driving.   Please note  You were cared for by a hospitalist during your hospital stay. If you have any questions about your discharge medications or the care you received while you were in the hospital after you are discharged, you can call the unit and asked to speak with the hospitalist on call if the hospitalist that took care of you is not available. Once you are discharged, your primary care physician will handle any further medical issues. Please note that NO REFILLS for any discharge medications will be authorized once you are discharged, as it is imperative that you return to your primary care physician (or establish a relationship with a primary care physician if you do not have one) for your aftercare needs so that they can reassess your need for medications and monitor your lab values.

## 2018-04-29 LAB — CULTURE, BODY FLUID W GRAM STAIN -BOTTLE: Culture: NO GROWTH

## 2018-04-29 LAB — CULTURE, BODY FLUID-BOTTLE

## 2018-05-02 ENCOUNTER — Other Ambulatory Visit: Payer: Self-pay

## 2018-05-02 ENCOUNTER — Emergency Department (HOSPITAL_COMMUNITY): Payer: Medicare HMO

## 2018-05-02 ENCOUNTER — Inpatient Hospital Stay (HOSPITAL_COMMUNITY)
Admission: EM | Admit: 2018-05-02 | Discharge: 2018-05-13 | DRG: 683 | Disposition: A | Discharge status: Home Health | Payer: Medicare HMO | Attending: Family Medicine | Admitting: Family Medicine

## 2018-05-02 ENCOUNTER — Encounter (HOSPITAL_COMMUNITY): Payer: Self-pay | Admitting: *Deleted

## 2018-05-02 DIAGNOSIS — I959 Hypotension, unspecified: Secondary | ICD-10-CM

## 2018-05-02 DIAGNOSIS — Z8673 Personal history of transient ischemic attack (TIA), and cerebral infarction without residual deficits: Secondary | ICD-10-CM | POA: Diagnosis not present

## 2018-05-02 DIAGNOSIS — D630 Anemia in neoplastic disease: Secondary | ICD-10-CM | POA: Diagnosis present

## 2018-05-02 DIAGNOSIS — I5032 Chronic diastolic (congestive) heart failure: Secondary | ICD-10-CM | POA: Diagnosis present

## 2018-05-02 DIAGNOSIS — N179 Acute kidney failure, unspecified: Secondary | ICD-10-CM | POA: Diagnosis present

## 2018-05-02 DIAGNOSIS — C7800 Secondary malignant neoplasm of unspecified lung: Secondary | ICD-10-CM | POA: Diagnosis present

## 2018-05-02 DIAGNOSIS — R05 Cough: Secondary | ICD-10-CM | POA: Diagnosis present

## 2018-05-02 DIAGNOSIS — Z79899 Other long term (current) drug therapy: Secondary | ICD-10-CM | POA: Diagnosis not present

## 2018-05-02 DIAGNOSIS — E860 Dehydration: Secondary | ICD-10-CM | POA: Diagnosis present

## 2018-05-02 DIAGNOSIS — R197 Diarrhea, unspecified: Secondary | ICD-10-CM | POA: Diagnosis present

## 2018-05-02 DIAGNOSIS — E441 Mild protein-calorie malnutrition: Secondary | ICD-10-CM | POA: Diagnosis present

## 2018-05-02 DIAGNOSIS — L8990 Pressure ulcer of unspecified site, unspecified stage: Secondary | ICD-10-CM | POA: Diagnosis not present

## 2018-05-02 DIAGNOSIS — I10 Essential (primary) hypertension: Secondary | ICD-10-CM | POA: Diagnosis present

## 2018-05-02 DIAGNOSIS — I4510 Unspecified right bundle-branch block: Secondary | ICD-10-CM | POA: Diagnosis present

## 2018-05-02 DIAGNOSIS — R5381 Other malaise: Secondary | ICD-10-CM | POA: Diagnosis present

## 2018-05-02 DIAGNOSIS — Y846 Urinary catheterization as the cause of abnormal reaction of the patient, or of later complication, without mention of misadventure at the time of the procedure: Secondary | ICD-10-CM | POA: Diagnosis not present

## 2018-05-02 DIAGNOSIS — K922 Gastrointestinal hemorrhage, unspecified: Secondary | ICD-10-CM

## 2018-05-02 DIAGNOSIS — G40909 Epilepsy, unspecified, not intractable, without status epilepticus: Secondary | ICD-10-CM

## 2018-05-02 DIAGNOSIS — R195 Other fecal abnormalities: Secondary | ICD-10-CM | POA: Diagnosis present

## 2018-05-02 DIAGNOSIS — N189 Chronic kidney disease, unspecified: Secondary | ICD-10-CM | POA: Diagnosis not present

## 2018-05-02 DIAGNOSIS — R059 Cough, unspecified: Secondary | ICD-10-CM

## 2018-05-02 DIAGNOSIS — C61 Malignant neoplasm of prostate: Secondary | ICD-10-CM | POA: Diagnosis not present

## 2018-05-02 DIAGNOSIS — I130 Hypertensive heart and chronic kidney disease with heart failure and stage 1 through stage 4 chronic kidney disease, or unspecified chronic kidney disease: Secondary | ICD-10-CM | POA: Diagnosis present

## 2018-05-02 DIAGNOSIS — Z7902 Long term (current) use of antithrombotics/antiplatelets: Secondary | ICD-10-CM

## 2018-05-02 DIAGNOSIS — Z9079 Acquired absence of other genital organ(s): Secondary | ICD-10-CM

## 2018-05-02 DIAGNOSIS — N183 Chronic kidney disease, stage 3 (moderate): Secondary | ICD-10-CM | POA: Diagnosis present

## 2018-05-02 DIAGNOSIS — Z6825 Body mass index (BMI) 25.0-25.9, adult: Secondary | ICD-10-CM | POA: Diagnosis not present

## 2018-05-02 DIAGNOSIS — Z886 Allergy status to analgesic agent status: Secondary | ICD-10-CM

## 2018-05-02 DIAGNOSIS — K222 Esophageal obstruction: Secondary | ICD-10-CM | POA: Diagnosis present

## 2018-05-02 DIAGNOSIS — J910 Malignant pleural effusion: Secondary | ICD-10-CM | POA: Diagnosis present

## 2018-05-02 DIAGNOSIS — Z923 Personal history of irradiation: Secondary | ICD-10-CM

## 2018-05-02 DIAGNOSIS — L89151 Pressure ulcer of sacral region, stage 1: Secondary | ICD-10-CM | POA: Diagnosis present

## 2018-05-02 DIAGNOSIS — E538 Deficiency of other specified B group vitamins: Secondary | ICD-10-CM | POA: Diagnosis present

## 2018-05-02 DIAGNOSIS — E876 Hypokalemia: Secondary | ICD-10-CM | POA: Diagnosis present

## 2018-05-02 DIAGNOSIS — D638 Anemia in other chronic diseases classified elsewhere: Secondary | ICD-10-CM | POA: Diagnosis not present

## 2018-05-02 DIAGNOSIS — Y92239 Unspecified place in hospital as the place of occurrence of the external cause: Secondary | ICD-10-CM | POA: Diagnosis not present

## 2018-05-02 DIAGNOSIS — F0390 Unspecified dementia without behavioral disturbance: Secondary | ICD-10-CM | POA: Diagnosis present

## 2018-05-02 DIAGNOSIS — Z79890 Hormone replacement therapy (postmenopausal): Secondary | ICD-10-CM

## 2018-05-02 DIAGNOSIS — B3781 Candidal esophagitis: Secondary | ICD-10-CM | POA: Diagnosis present

## 2018-05-02 DIAGNOSIS — Z7982 Long term (current) use of aspirin: Secondary | ICD-10-CM

## 2018-05-02 DIAGNOSIS — R300 Dysuria: Secondary | ICD-10-CM | POA: Diagnosis present

## 2018-05-02 DIAGNOSIS — E872 Acidosis: Secondary | ICD-10-CM | POA: Diagnosis present

## 2018-05-02 DIAGNOSIS — D631 Anemia in chronic kidney disease: Secondary | ICD-10-CM | POA: Diagnosis present

## 2018-05-02 DIAGNOSIS — E039 Hypothyroidism, unspecified: Secondary | ICD-10-CM | POA: Diagnosis present

## 2018-05-02 DIAGNOSIS — I509 Heart failure, unspecified: Secondary | ICD-10-CM

## 2018-05-02 DIAGNOSIS — N485 Ulcer of penis: Secondary | ICD-10-CM | POA: Diagnosis not present

## 2018-05-02 HISTORY — DX: Chronic kidney disease, unspecified: N18.9

## 2018-05-02 HISTORY — DX: Heart failure, unspecified: I50.9

## 2018-05-02 HISTORY — DX: Hypothyroidism, unspecified: E03.9

## 2018-05-02 HISTORY — DX: Sleep apnea, unspecified: G47.30

## 2018-05-02 HISTORY — DX: Personal history of other diseases of the digestive system: Z87.19

## 2018-05-02 HISTORY — DX: Malignant neoplasm of prostate: C61

## 2018-05-02 HISTORY — DX: Pure hypercholesterolemia, unspecified: E78.00

## 2018-05-02 HISTORY — DX: Gastro-esophageal reflux disease without esophagitis: K21.9

## 2018-05-02 HISTORY — DX: Secondary malignant neoplasm of bone: C79.51

## 2018-05-02 HISTORY — DX: Unspecified osteoarthritis, unspecified site: M19.90

## 2018-05-02 HISTORY — DX: Transient cerebral ischemic attack, unspecified: G45.9

## 2018-05-02 LAB — COMPREHENSIVE METABOLIC PANEL
ALT: 14 U/L (ref 0–44)
AST: 18 U/L (ref 15–41)
Albumin: 3.8 g/dL (ref 3.5–5.0)
Alkaline Phosphatase: 82 U/L (ref 38–126)
Anion gap: 10 (ref 5–15)
BILIRUBIN TOTAL: 0.5 mg/dL (ref 0.3–1.2)
BUN: 57 mg/dL — ABNORMAL HIGH (ref 8–23)
CALCIUM: 9.1 mg/dL (ref 8.9–10.3)
CO2: 21 mmol/L — ABNORMAL LOW (ref 22–32)
Chloride: 103 mmol/L (ref 98–111)
Creatinine, Ser: 3.27 mg/dL — ABNORMAL HIGH (ref 0.61–1.24)
GFR calc non Af Amer: 15 mL/min — ABNORMAL LOW (ref >60)
GFR, EST AFRICAN AMERICAN: 18 mL/min — AB (ref >60)
Glucose, Bld: 98 mg/dL (ref 70–99)
POTASSIUM: 3.2 mmol/L — AB (ref 3.5–5.1)
SODIUM: 134 mmol/L — AB (ref 135–145)
TOTAL PROTEIN: 7.2 g/dL (ref 6.5–8.1)

## 2018-05-02 LAB — CBC
HCT: 29.3 % — ABNORMAL LOW (ref 39.0–52.0)
HCT: 32 % — ABNORMAL LOW (ref 39.0–52.0)
HEMOGLOBIN: 9.6 g/dL — AB (ref 13.0–17.0)
Hemoglobin: 9.1 g/dL — ABNORMAL LOW (ref 13.0–17.0)
MCH: 28.9 pg (ref 26.0–34.0)
MCH: 27.2 pg (ref 26.0–34.0)
MCHC: 31.1 g/dL (ref 30.0–36.0)
MCHC: 30 g/dL (ref 30.0–36.0)
MCV: 93 fL (ref 80.0–100.0)
MCV: 90.7 fL (ref 80.0–100.0)
NRBC: 0 % (ref 0.0–0.2)
PLATELETS: 160 10*3/uL (ref 150–400)
Platelets: 173 10*3/uL (ref 150–400)
RBC: 3.15 MIL/uL — AB (ref 4.22–5.81)
RBC: 3.53 MIL/uL — ABNORMAL LOW (ref 4.22–5.81)
RDW: 13.9 % (ref 11.5–15.5)
RDW: 14 % (ref 11.5–15.5)
WBC: 5.9 10*3/uL (ref 4.0–10.5)
WBC: 6.3 10*3/uL (ref 4.0–10.5)
nRBC: 0 % (ref 0.0–0.2)

## 2018-05-02 LAB — URINALYSIS, ROUTINE W REFLEX MICROSCOPIC
Bilirubin Urine: NEGATIVE
Glucose, UA: NEGATIVE mg/dL
Hgb urine dipstick: NEGATIVE
KETONES UR: NEGATIVE mg/dL
LEUKOCYTES UA: NEGATIVE
NITRITE: NEGATIVE
PROTEIN: NEGATIVE mg/dL
Specific Gravity, Urine: 1.008 (ref 1.005–1.030)
pH: 5 (ref 5.0–8.0)

## 2018-05-02 LAB — CREATININE, SERUM
Creatinine, Ser: 2.87 mg/dL — ABNORMAL HIGH (ref 0.61–1.24)
GFR calc non Af Amer: 18 mL/min — ABNORMAL LOW (ref >60)
GFR, EST AFRICAN AMERICAN: 21 mL/min — AB (ref >60)

## 2018-05-02 LAB — POC OCCULT BLOOD, ED: FECAL OCCULT BLD: NEGATIVE

## 2018-05-02 LAB — I-STAT TROPONIN, ED: Troponin i, poc: 0.02 ng/mL (ref 0.00–0.08)

## 2018-05-02 LAB — I-STAT CG4 LACTIC ACID, ED: Lactic Acid, Venous: 2.43 mmol/L (ref 0.5–1.9)

## 2018-05-02 MED ORDER — AMLODIPINE BESYLATE 10 MG PO TABS
10.0000 mg | ORAL_TABLET | Freq: Every day | ORAL | Status: DC
  Administered 2018-05-03 – 2018-05-13 (×11): 10 mg via ORAL
  Filled 2018-05-02 (×11): qty 1

## 2018-05-02 MED ORDER — SODIUM CHLORIDE 0.9 % IV SOLN
8.0000 mg/h | INTRAVENOUS | Status: DC
  Administered 2018-05-02: 8 mg/h via INTRAVENOUS
  Filled 2018-05-02 (×7): qty 80

## 2018-05-02 MED ORDER — SODIUM CHLORIDE 0.9 % IV SOLN
INTRAVENOUS | Status: DC
  Administered 2018-05-02 – 2018-05-04 (×3): via INTRAVENOUS

## 2018-05-02 MED ORDER — LEVOTHYROXINE SODIUM 112 MCG PO TABS
112.0000 ug | ORAL_TABLET | ORAL | Status: DC
  Administered 2018-05-03 – 2018-05-13 (×11): 112 ug via ORAL
  Filled 2018-05-02 (×10): qty 1

## 2018-05-02 MED ORDER — ASPIRIN EC 81 MG PO TBEC
81.0000 mg | DELAYED_RELEASE_TABLET | Freq: Every day | ORAL | Status: DC
  Administered 2018-05-03 – 2018-05-13 (×11): 81 mg via ORAL
  Filled 2018-05-02 (×11): qty 1

## 2018-05-02 MED ORDER — SODIUM CHLORIDE 0.9 % IV BOLUS
1000.0000 mL | Freq: Once | INTRAVENOUS | Status: AC
  Administered 2018-05-02: 1000 mL via INTRAVENOUS

## 2018-05-02 MED ORDER — PANTOPRAZOLE SODIUM 40 MG PO TBEC
40.0000 mg | DELAYED_RELEASE_TABLET | Freq: Every day | ORAL | Status: DC
  Administered 2018-05-03 – 2018-05-13 (×11): 40 mg via ORAL
  Filled 2018-05-02 (×11): qty 1

## 2018-05-02 MED ORDER — PANTOPRAZOLE SODIUM 40 MG IV SOLR
INTRAVENOUS | Status: AC
  Filled 2018-05-02: qty 160

## 2018-05-02 MED ORDER — HYDRALAZINE HCL 20 MG/ML IJ SOLN: 5.0000 mg | Freq: Four times a day (QID) | INTRAMUSCULAR | Status: DC | PRN

## 2018-05-02 MED ORDER — HEPARIN SODIUM (PORCINE) 5000 UNIT/ML IJ SOLN
5000.0000 [IU] | Freq: Three times a day (TID) | INTRAMUSCULAR | Status: DC
  Administered 2018-05-02 – 2018-05-13 (×28): 5000 [IU] via SUBCUTANEOUS
  Filled 2018-05-02 (×26): qty 1

## 2018-05-02 MED ORDER — LORAZEPAM 0.5 MG PO TABS: 0.5000 mg | ORAL_TABLET | Freq: Two times a day (BID) | ORAL | Status: DC | PRN

## 2018-05-02 MED ORDER — MEMANTINE HCL 10 MG PO TABS
10.0000 mg | ORAL_TABLET | Freq: Two times a day (BID) | ORAL | Status: DC
  Administered 2018-05-03 – 2018-05-13 (×21): 10 mg via ORAL
  Filled 2018-05-02 (×22): qty 1

## 2018-05-02 MED ORDER — SODIUM CHLORIDE 0.9 % IV SOLN
80.0000 mg | Freq: Once | INTRAVENOUS | Status: AC
  Administered 2018-05-02: 80 mg via INTRAVENOUS
  Filled 2018-05-02: qty 80

## 2018-05-02 MED ORDER — CLONIDINE HCL 0.1 MG PO TABS
0.1000 mg | ORAL_TABLET | Freq: Two times a day (BID) | ORAL | Status: DC
  Administered 2018-05-03 – 2018-05-13 (×21): 0.1 mg via ORAL
  Filled 2018-05-02 (×21): qty 1

## 2018-05-02 MED ORDER — LEVETIRACETAM 500 MG PO TABS
750.0000 mg | ORAL_TABLET | Freq: Two times a day (BID) | ORAL | Status: DC
  Administered 2018-05-03 – 2018-05-05 (×5): 750 mg via ORAL
  Filled 2018-05-02 (×5): qty 1

## 2018-05-02 MED ORDER — CLOPIDOGREL BISULFATE 75 MG PO TABS
75.0000 mg | ORAL_TABLET | Freq: Every day | ORAL | Status: DC
  Administered 2018-05-03 – 2018-05-13 (×11): 75 mg via ORAL
  Filled 2018-05-02 (×11): qty 1

## 2018-05-02 NOTE — ED Triage Notes (Signed)
Patient family called EMS due to "lethargy and low BP".  EMS initial report of low BP, once patient began to move around BP back into normal limit.  Patient states he is just tired and wants to sleep.  CBG per EMS 148. Patient is alert and oriented times 4, no distress noted.

## 2018-05-02 NOTE — H&P (Addendum)
PCP:   [name], MD   Chief Complaint:  Low blood pressure  HPI: This is a 84 y/o male who a h/o of prostate cancer, recently diagnosed with lung mets. He he was d/c from [facility] last Monday. He has been doing, eating and drinking ok. Today it was noted that the patients blood pressure was soft. His SBP was in the 90's. Later his SBP dropped furthur to 60/40, 911 was called and he was sent to the ER. There is no report of fevers or chills, no nausea or vomiting. He denies any pain. He states his UOP is unchanged, no decrease in his UOP. He is not light headed or dizzy. He denies any BOU. Per his daughter he has been having liquid only stools since Tuesday. Clear watery diarrhea, no evidence of bleeding. Patient occult negative in ER,  In the ER his creatine was noted to have increased from 1.6 to 3.27. Request for admission was made. The family requested transfer to [facility].  History provided by the patient and family members present at bedside.   Review of Systems:  The patient denies anorexia, fever, weight loss,, low blood pressure, diarrhea, vision loss, decreased hearing, hoarseness, chest pain, syncope, dyspnea on exertion, peripheral edema, balance deficits, hemoptysis, abdominal pain, melena, hematochezia, severe indigestion/heartburn, hematuria, incontinence, genital sores, muscle weakness, suspicious skin lesions, transient blindness, difficulty walking, depression, unusual weight change, abnormal bleeding, enlarged lymph nodes, angioedema, and breast masses.  Past Medical History: Past Medical History:  Diagnosis Date  . Cancer [facility]) 2014   prostate  . Dementia ([place])   . HTN (hypertension)   . Seizures ([place])   . Stroke [facility]) 2014   TIA  . Thyroid disease    Past Surgical History:  Procedure Laterality Date  . BACK SURGERY     cervical spine  . COLONOSCOPY     at [facility], polyp per wife  . CYSTOSCOPY N/A 12/25/2012   Procedure: CYSTOSCOPY FLEXIBLE;  Surgeon: Marissa Nestle, MD;  Location: [facility] ORS;  Service: Urology;  Laterality: N/A;  . ESOPHAGOGASTRODUODENOSCOPY (EGD) WITH ESOPHAGEAL DILATION N/A 12/27/2012   Procedure: ESOPHAGOGASTRODUODENOSCOPY (EGD) WITH ESOPHAGEAL DILATION;  Surgeon: Danie Binder, MD;  Location: [facility] ENDO SUITE;  Service: Endoscopy;  Laterality: N/A;  . HERNIA REPAIR     x 2  . IR THORACENTESIS ASP PLEURAL SPACE W/IMG GUIDE  04/24/2018  . PROSTATE SURGERY      Medications: Prior to Admission medications   Medication Sig Start Date End Date Taking? Authorizing Provider  amLODipine (NORVASC) 10 MG tablet Take 1 tablet (10 mg total) by mouth daily. 04/29/18  Yes Elgergawy, Silver Huguenin, MD  aspirin EC 81 MG tablet Take 81 mg by mouth daily.   Yes [provider]  cloNIDine (CATAPRES) 0.1 MG tablet Take 0.1 mg by mouth 2 (two) times daily.   Yes [provider]  clopidogrel (PLAVIX) 75 MG tablet Take 75 mg by mouth daily. 12/08/13  Yes [provider]  furosemide (LASIX) 20 MG tablet Take 1 tablet (20 mg total) by mouth daily. 04/28/18  Yes Elgergawy, Silver Huguenin, MD  hydrochlorothiazide (HYDRODIURIL) 25 MG tablet Take 25 mg by mouth daily.   Yes [provider]  levETIRAcetam (KEPPRA) 750 MG tablet Take 1 tablet (750 mg total) by mouth 2 (two) times daily. 10/03/12  Yes Nita Sells, MD  levothyroxine (SYNTHROID, LEVOTHROID) 112 MCG tablet Take 112 mcg by mouth daily before breakfast.    Yes [provider]  LORazepam (  ATIVAN) 0.5 MG tablet Take 1 tablet (0.5 mg total) by mouth 2 (two) times daily as needed for anxiety. 12/30/12  Yes [name], MD  losartan (COZAAR) 50 MG tablet Take 50 mg by mouth daily.   Yes [provider]  memantine (NAMENDA) 10 MG tablet Take 10 mg by mouth 2 (two) times daily.   Yes [provider]  omeprazole (PRILOSEC) 20 MG capsule Take 20 mg by mouth daily.   Yes [provider]  ondansetron (ZOFRAN) 4 MG tablet Take 1 tablet (4 mg  total) by mouth every 8 (eight) hours as needed for nausea or vomiting. 04/28/18  Yes Elgergawy, Silver Huguenin, MD  potassium chloride (K-DUR) 10 MEQ tablet Take 1 tablet (10 mEq total) by mouth 2 (two) times daily. Patient taking differently: Take 10 mEq by mouth daily.  10/01/12  Yes [name], MD    Allergies:   Allergies  Allergen Reactions  . Aspirin     REACTION: "elevated pulse". Cannot take any strength higher than 81mg     Social History:  reports that he has never smoked. He has never used smokeless tobacco. He reports that he does not drink alcohol or use drugs.  Family History: Family History  Problem Relation Age of Onset  . Arthritis Unknown   . Cancer Brother        rectal  . Asthma Unknown   . Diabetes Unknown     Physical Exam: Vitals:   05/02/18 1522 05/02/18 1600 05/02/18 1736 05/02/18 1800  BP:  (!) 97/59 119/78 107/70  Pulse:    67  Resp:    (!) 22  Temp:      TempSrc:      SpO2:    100%  Weight: 68 kg     Height: 5\' 10"  (1.778 m)       General:  Alert and oriented times three, well developed, elderly Eyes: PERRLA, pink conjunctiva, no scleral icterus ENT: Moist oral mucosa, neck supple, no thyromegaly Lungs: clear to ascultation, no wheeze, no crackles, no use of accessory muscles Cardiovascular: regular rate and rhythm, no regurgitation, no gallops, no murmurs. No carotid bruits, no JVD Abdomen: soft, positive BS, non-tender, non-distended, no organomegaly, not an acute abdomen GU: not examined Neuro: CN II - XII grossly intact, sensation intact Musculoskeletal: strength 5/5 all extremities, no clubbing, cyanosis or edema Skin: no rash, no subcutaneous crepitation, no decubitus Psych: appropriate patient   Labs on Admission:  Recent Labs    05/02/18 1612  NA 134*  K 3.2*  CL 103  CO2 21*  GLUCOSE 98  BUN 57*  CREATININE 3.27*  CALCIUM 9.1   Recent Labs    05/02/18 1612  AST 18  ALT 14  ALKPHOS 82  BILITOT 0.5  PROT 7.2   ALBUMIN 3.8   No results for input(s): LIPASE, AMYLASE in the last 72 hours. Recent Labs    05/02/18 1612  WBC 5.9  HGB 9.1*  HCT 29.3*  MCV 93.0  PLT 160   No results for input(s): CKTOTAL, CKMB, CKMBINDEX, TROPONINI in the last 72 hours. Invalid input(s): POCBNP No results for input(s): DDIMER in the last 72 hours. No results for input(s): HGBA1C in the last 72 hours. No results for input(s): CHOL, HDL, LDLCALC, TRIG, CHOLHDL, LDLDIRECT in the last 72 hours. No results for input(s): TSH, T4TOTAL, T3FREE, THYROIDAB in the last 72 hours.  Invalid input(s): FREET3 No results for input(s): VITAMINB12, FOLATE, FERRITIN, TIBC, IRON, RETICCTPCT in the last 72  hours.   Results for IYAD, DEROO (MRN [id number]) as of 05/02/2018 20:38  Ref. Range 05/02/2018 19:16  URINALYSIS, ROUTINE W REFLEX MICROSCOPIC Unknown Rpt  Appearance Latest Ref Range: CLEAR  CLEAR  Bilirubin Urine Latest Ref Range: NEGATIVE  NEGATIVE  Color, Urine Latest Ref Range: YELLOW  YELLOW  Glucose, UA Latest Ref Range: NEGATIVE mg/dL NEGATIVE  Hgb urine dipstick Latest Ref Range: NEGATIVE  NEGATIVE  Ketones, ur Latest Ref Range: NEGATIVE mg/dL NEGATIVE  Leukocytes, UA Latest Ref Range: NEGATIVE  NEGATIVE  Nitrite Latest Ref Range: NEGATIVE  NEGATIVE  pH Latest Ref Range: 5.0 - 8.0  5.0  Protein Latest Ref Range: NEGATIVE mg/dL NEGATIVE  Specific Gravity, Urine Latest Ref Range: 1.005 - 1.030  1.008     Micro Results: Recent Results (from the past 240 hour(s))  Gram stain     Status: None   Collection Time: 04/24/18 12:28 PM  Result Value Ref Range Status   Specimen Description PLEURAL LEFT  Final   Special Requests NONE  Final   Gram Stain   Final    RARE WBC PRESENT, PREDOMINANTLY PMN NO ORGANISMS SEEN Performed at [facility], [address] [place], [place] [address]    Report Status 04/24/2018 FINAL  Final  Culture, body fluid-bottle     Status: None   Collection Time: 04/24/18 12:28  PM  Result Value Ref Range Status   Specimen Description PLEURAL LEFT  Final   Special Requests NONE  Final   Culture   Final    NO GROWTH 5 DAYS Performed at [facility] [address], [place], [place] [address]    Report Status 04/29/2018 FINAL  Final     Radiological Exams on Admission: Dg Chest 2 View  Result Date: 05/02/2018 CLINICAL DATA:  Cough and hypotension today. History of prostate cancer. EXAM: CHEST - 2 VIEW COMPARISON:  CT chest 04/25/2018. Single-view of the chest 04/24/2018. FINDINGS: Small to moderate left pleural effusion has decreased in size since the prior examination. There is compressive atelectasis in the left lung base. The right lung is clear. No right effusion. No pneumothorax. Scattered sclerotic bony lesions consistent with metastatic prostate carcinoma noted. IMPRESSION: Small to moderate left pleural effusion has decreased in size since the most recent examination. No new abnormality. Metastatic prostate carcinoma. Electronically Signed   By: Inge Rise M.D.   On: 05/02/2018 16:11    Assessment/Plan Present on Admission: . Acute kidney injury superimposed on chronic kidney disease ([place]) -Admit to MedSurg at [facility] -Likely due to dehydration from medication, diarrhea, possible urinary retention with patient's prostate cancer and enlarged prostate -hold lasix and HCTZ and cozaar -ua ordered and pending -gentle IV fluid hydration -Strict I's and O's, BMP in AM -Defer to AM team nephrology consult if creatinine not improving in a.m.  Diarrhea -stool cultures and c diff toxin  Lactic acidosis -likely from dehydration. No evidence of infection. IVF hydration. Repeat lactic acid level in AM -no antibiotics started  Prostate cancer -?obstruction.  Foley not placed.  Will order serial bladder scans for the next 24 hours  . Dementia ([place]) -Stable, home meds resumed  Seizures -Stable, home meds resumed  . HTN  (hypertension) -Stable, home meds resumed -PRN blood pressure medicatioins  . Hypothyroidism -Stable, home meds resumed  . Hypokalemia -PO potassium held for now with patient said increased creatinine  [name] 05/02/2018, 7:05 PM

## 2018-05-02 NOTE — ED Notes (Signed)
Patient left unit with [facility]

## 2018-05-02 NOTE — ED Provider Notes (Signed)
[facility] Emergency Department Provider Note MRN:  [id number]  Arrival date & time: 05/02/18     Chief Complaint   Fatigue   History of Present Illness   TAE VONADA is a 84 y.o. year-old male with a history of metastatic prostate cancer, hypertension, stroke presenting to the ED with chief complaint of fatigue.  EMS was called today by family, who noted increased lethargy and fatigue today.  Patient's blood pressure was checked and found to be low.  Blood pressure did improve on patient began walking/moving around the house.  Patient currently denying any recent fevers or chills, no headache or vision change, no chest pain or shortness of breath, no abdominal pain, no dysuria, no numbness or weakness to the arms or legs.  Review of Systems  A complete 10 system review of systems was obtained and all systems are negative except as noted in the HPI and PMH.   Patient's Health History    Past Medical History:  Diagnosis Date  . Cancer [facility]) 2014   prostate  . Dementia ([place])   . HTN (hypertension)   . Seizures ([place])   . Stroke [facility]) 2014   TIA  . Thyroid disease     Past Surgical History:  Procedure Laterality Date  . BACK SURGERY     cervical spine  . COLONOSCOPY     at [facility], polyp per wife  . CYSTOSCOPY N/A 12/25/2012   Procedure: CYSTOSCOPY FLEXIBLE;  Surgeon: Marissa Nestle, MD;  Location: [facility] ORS;  Service: Urology;  Laterality: N/A;  . ESOPHAGOGASTRODUODENOSCOPY (EGD) WITH ESOPHAGEAL DILATION N/A 12/27/2012   Procedure: ESOPHAGOGASTRODUODENOSCOPY (EGD) WITH ESOPHAGEAL DILATION;  Surgeon: Danie Binder, MD;  Location: [facility] ENDO SUITE;  Service: Endoscopy;  Laterality: N/A;  . HERNIA REPAIR     x 2  . IR THORACENTESIS ASP PLEURAL SPACE W/IMG GUIDE  04/24/2018  . PROSTATE SURGERY      Family History  Problem Relation Age of Onset  . Arthritis Unknown   . Cancer Brother        rectal  . Asthma Unknown   . Diabetes Unknown     Social History    Socioeconomic History  . Marital status: Married    Spouse name: Not on file  . Number of children: Not on file  . Years of education: college  . Highest education level: Not on file  Occupational History  . Not on file  Social Needs  . Financial resource strain: Not hard at all  . Food insecurity:    Worry: Never true    Inability: Never true  . Transportation needs:    Medical: No    Non-medical: No  Tobacco Use  . Smoking status: Never Smoker  . Smokeless tobacco: Never Used  Substance and Sexual Activity  . Alcohol use: No  . Drug use: No  . Sexual activity: Not Currently  Lifestyle  . Physical activity:    Days per week: 2 days    Minutes per session: 20 min  . Stress: Not at all  Relationships  . Social connections:    Talks on phone: Patient refused    Gets together: Patient refused    Attends religious service: Patient refused    Active member of club or organization: Patient refused    Attends meetings of clubs or organizations: Patient refused    Relationship status: Patient refused  . Intimate partner violence:    Fear of current or ex partner: Patient refused  Emotionally abused: Patient refused    Physically abused: Patient refused    Forced sexual activity: Patient refused  Other Topics Concern  . Not on file  Social History Narrative  . Not on file     Physical Exam  Vital Signs and Nursing Notes reviewed Vitals:   05/02/18 1736 05/02/18 1800  BP: 119/78 107/70  Pulse:  67  Resp:  (!) 22  Temp:    SpO2:  100%    CONSTITUTIONAL: Tired-appearing, NAD NEURO:  Alert and oriented x 3, no focal deficits EYES:  eyes equal and reactive ENT/NECK:  no LAD, no JVD CARDIO: Regular rate, well-perfused, normal S1 and S2 PULM:  CTAB no wheezing or rhonchi GI/GU:  normal bowel sounds, non-distended, non-tender MSK/SPINE:  No gross deformities, no edema SKIN:  no rash, atraumatic PSYCH:  Appropriate speech and behavior  Diagnostic and  Interventional Summary    EKG Interpretation  Date/Time:  Friday May 02 2018 16:43:41 EST Ventricular Rate:  60 PR Interval:    QRS Duration: 153 QT Interval:  472 QTC Calculation: 472 R Axis:   49 Text Interpretation:  Sinus rhythm Prolonged PR interval Right bundle branch block Confirmed by Gerlene Fee [phone]) on 05/02/2018 4:54:57 PM      Labs Reviewed  CBC - Abnormal; Notable for the following components:      Result Value   RBC 3.15 (*)    Hemoglobin 9.1 (*)    HCT 29.3 (*)    All other components within normal limits  COMPREHENSIVE METABOLIC PANEL - Abnormal; Notable for the following components:   Sodium 134 (*)    Potassium 3.2 (*)    CO2 21 (*)    BUN 57 (*)    Creatinine, Ser 3.27 (*)    GFR calc non Af Amer 15 (*)    GFR calc Af Amer 18 (*)    All other components within normal limits  I-STAT CG4 LACTIC ACID, ED - Abnormal; Notable for the following components:   Lactic Acid, Venous 2.43 (*)    All other components within normal limits  URINALYSIS, ROUTINE W REFLEX MICROSCOPIC  I-STAT TROPONIN, ED  POC OCCULT BLOOD, ED    DG Chest 2 View  Final Result      Medications  sodium chloride 0.9 % bolus 1,000 mL (1,000 mLs Intravenous New Bag/Given 05/02/18 1757)  pantoprazole (PROTONIX) 80 mg in sodium chloride 0.9 % 100 mL IVPB (has no administration in time range)  pantoprazole (PROTONIX) 80 mg in sodium chloride 0.9 % 250 mL (0.32 mg/mL) infusion (has no administration in time range)  sodium chloride 0.9 % bolus 1,000 mL (0 mLs Intravenous Stopped 05/02/18 1739)     Procedures Critical Care Critical Care Documentation Critical care time provided by me (excluding procedures): 37 minutes  Condition necessitating critical care: Hypotension, concern for upper GI bleed  Components of critical care management: reviewing of prior records, laboratory and imaging interpretation, frequent re-examination and reassessment of vital signs, administration of IV  fluid resuscitation, IV Protonix, discussion with consulting services, facilitation of transfer.    ED Course and Medical Decision Making  I have reviewed the triage vital signs and the nursing notes.  Pertinent labs & imaging results that were available during my care of the patient were reviewed by me and considered in my medical decision making (see below for details).  Fatigue, lethargy, somnolence in this 84 year old male with multiple comorbidities, recent admission 9 days ago for malignant pleural effusion that required IR drainage.  Vital signs stable, no increased work of breathing, currently asymptomatic.  Blood pressure 90 systolic, considering infection, metabolic disarray, dehydration.  Patient is warm and well-perfused.  Will provide 1 L normal saline, work-up pending.  Clinical Course as of May 03 1851  Ludwig Clarks May 02, 2018  3578 Labs reveal AKI with significantly elevated BUN, downtrending hemoglobin.  Patient and family deny any reason why he would be dehydrated, has been drinking well.  No vomiting, no diarrhea.  Concern for upper GI bleeding, rectal exam performed, no stool obtained.  Given total of 2 L normal saline, Protonix drip initiated.   [MB]  1823 Family has had prior concerns with [facility], requesting transfer to [facility].   [MB]    Clinical Course User Index [MB] Sedonia Small Barth Kirks, MD    Spoke with Dr. Claria Dice of [facility], who will admit the patient and facilitate transfer to [facility].  Barth Kirks. Sedonia Small, [place] [email or web address]  Final Clinical Impressions(s) / ED Diagnoses     ICD-10-CM   1. Hypotension, unspecified hypotension type I95.9   2. Cough R05 DG Chest 2 View    DG Chest 2 View  3. AKI (acute kidney injury) ([place]) N17.9   4. Gastrointestinal hemorrhage, unspecified gastrointestinal hemorrhage type K92.2     ED Discharge Orders    None         Maudie Flakes,  MD 05/02/18 [phone]

## 2018-05-02 NOTE — ED Notes (Signed)
CRITICAL VALUE ALERT  Critical Value:  Lactic 2.43  Date & Time Notied:  05/02/18 5745  Provider Notified: Sedonia Small  Orders Received/Actions taken: continue to monitor

## 2018-05-03 DIAGNOSIS — C61 Malignant neoplasm of prostate: Secondary | ICD-10-CM

## 2018-05-03 DIAGNOSIS — I959 Hypotension, unspecified: Secondary | ICD-10-CM

## 2018-05-03 DIAGNOSIS — F0390 Unspecified dementia without behavioral disturbance: Secondary | ICD-10-CM

## 2018-05-03 DIAGNOSIS — G40909 Epilepsy, unspecified, not intractable, without status epilepticus: Secondary | ICD-10-CM

## 2018-05-03 DIAGNOSIS — I10 Essential (primary) hypertension: Secondary | ICD-10-CM

## 2018-05-03 DIAGNOSIS — L8990 Pressure ulcer of unspecified site, unspecified stage: Secondary | ICD-10-CM

## 2018-05-03 DIAGNOSIS — E876 Hypokalemia: Secondary | ICD-10-CM

## 2018-05-03 DIAGNOSIS — E039 Hypothyroidism, unspecified: Secondary | ICD-10-CM

## 2018-05-03 LAB — BASIC METABOLIC PANEL
Anion gap: 14 (ref 5–15)
BUN: 47 mg/dL — ABNORMAL HIGH (ref 8–23)
CO2: 20 mmol/L — AB (ref 22–32)
Calcium: 9.3 mg/dL (ref 8.9–10.3)
Chloride: 105 mmol/L (ref 98–111)
Creatinine, Ser: 2.66 mg/dL — ABNORMAL HIGH (ref 0.61–1.24)
GFR calc Af Amer: 23 mL/min — ABNORMAL LOW (ref >60)
GFR calc non Af Amer: 20 mL/min — ABNORMAL LOW (ref >60)
GLUCOSE: 86 mg/dL (ref 70–99)
Potassium: 3.5 mmol/L (ref 3.5–5.1)
Sodium: 139 mmol/L (ref 135–145)

## 2018-05-03 LAB — CBC
HCT: 34.2 % — ABNORMAL LOW (ref 39.0–52.0)
Hemoglobin: 10.6 g/dL — ABNORMAL LOW (ref 13.0–17.0)
MCH: 27.7 pg (ref 26.0–34.0)
MCHC: 31 g/dL (ref 30.0–36.0)
MCV: 89.3 fL (ref 80.0–100.0)
Platelets: 150 10*3/uL (ref 150–400)
RBC: 3.83 MIL/uL — AB (ref 4.22–5.81)
RDW: 13.9 % (ref 11.5–15.5)
WBC: 6 10*3/uL (ref 4.0–10.5)
nRBC: 0 % (ref 0.0–0.2)

## 2018-05-03 LAB — PROTIME-INR
INR: 1.16
Prothrombin Time: 14.7 seconds (ref 11.4–15.2)

## 2018-05-03 LAB — MAGNESIUM: Magnesium: 2.2 mg/dL (ref 1.7–2.4)

## 2018-05-03 NOTE — Progress Notes (Signed)
PROGRESS NOTE    [name]  XHB:[id number] DOB: 1921/05/22 DOA: 05/02/2018 PCP: [name], MD   Brief Narrative:  This is a 84 y/o male who a h/o of prostate cancer, recently diagnosed with lung mets. He he was d/c from [facility] last Monday. He has been doing, eating and drinking ok. Today it was noted that the patients blood pressure was soft. His SBP was in the 90's. Later his SBP dropped furthur to 60/40, 911 was called and he was sent to the ER. There is no report of fevers or chills, no nausea or vomiting. He denies any pain. He states his UOP is unchanged, no decrease in his UOP. He is not light headed or dizzy. He denies any BOU. Per his daughter he has been having liquid only stools since Tuesday. Clear watery diarrhea, no evidence of bleeding. Patient occult negative in ER,  In the ER his creatine was noted to have increased from 1.6 to 3.27. Request for admission was made. The family requested transfer to [place].  History provided by the patient and family members present at bedside.   Assessment & Plan:   Principal Problem:   Acute kidney injury superimposed on chronic kidney disease ([place]) Active Problems:   Hypothyroidism   HTN (hypertension)   Seizure disorder (HCC)   Hypokalemia   Dementia (HCC)   Prostate cancer ([place])   Lung metastases ([place])   Acute-on-chronic kidney injury ([place])   Pressure injury of skin   Hypotension  #1 acute renal failure on chronic kidney disease stage III Likely secondary to prerenal azotemia as patient noted to be hypotensive on presentation in the setting of diuretics and ARB.  Patient also noted to have had some loose stools prior to admission.  Check a urine sodium.  Check a urine creatinine.  Creatinine on admission was 3.27.  Renal function trending down creatinine currently at 2.66.  Continue IV fluids.  Continue to hold diuretics and ARB.  ARB was to be discontinued on prior discharge.  Follow.  2.  Hypotension Likely  secondary to hypovolemia.  Blood pressure responding to IV fluids.  Patient afebrile.  EKG with no ischemic changes.  Urinalysis is nitrite negative leukocytes negative.  Chest x-ray negative for any acute infiltrate.  C. difficile PCR pending.  Continue IV fluids.  Follow.  3.  Diarrhea Questionable etiology.  Patient with no loose stools this morning.  C. difficile PCR and GI pathogen panel pending.  Supportive care.  4.  Hypothyroidism TSH on 04/24/2018 was 8.377.  Continue home regimen Synthroid.  Will need repeat thyroid function studies done in about 4 to 6 weeks.  5.  Hypertension Blood pressure improved.  Patient no longer hypotensive.  Continue home regimen of clonidine and Norvasc.  6.  Lactic acidosis Likely secondary to dehydration.  Urinalysis unremarkable.  Chest x-ray negative for any acute infiltrate.  Patient currently afebrile.  Continue IV fluids.  Repeat lactic acid level in the morning.  7.  History of metastatic prostate cancer/malignant pleural effusion Outpatient follow-up with oncology.  8.  Dementia Stable.  Continue home regimen of Namenda..  9.  Seizures Patient with no signs of seizures.  Continue home regimen of Keppra.  10.  Hypokalemia Repleted.  Follow.  11.  Stage I pressure injury of the sacrum Continue current wound care.     DVT prophylaxis: Heparin Code Status: Full Family Communication: Updated patient and daughters at bedside. Disposition Plan: To be determined.   Consultants:   None  Procedures:   Chest x-ray 05/02/2018    Antimicrobials:   None   Subjective: Sitting up in bed.  Denies chest pain.  No shortness of breath.  No abdominal pain.  Per RN no significant loose stools this morning.  May have had a small mucous in stool this morning.  Objective: Vitals:   05/02/18 2027 05/02/18 2153 05/03/18 0426 05/03/18 1107  BP: 117/68 (!) 161/77 (!) 153/88 (!) 148/76  Pulse: 62 (!) 59 73   Resp: 16 16 18    Temp: 97.6 F  (36.4 C) (!) 97.5 F (36.4 C) 97.8 F (36.6 C)   TempSrc: Oral Oral Oral   SpO2: 97% 95% 100%   Weight:      Height:        Intake/Output Summary (Last 24 hours) at 05/03/2018 1538 Last data filed at 05/03/2018 1140 Gross per 24 hour  Intake 1237.59 ml  Output 350 ml  Net 887.59 ml   Filed Weights   05/02/18 1522  Weight: 68 kg    Examination:  General exam: Appears calm and comfortable  Respiratory system: Clear to auscultation. Respiratory effort normal. Cardiovascular system: S1 & S2 heard, RRR. No JVD, murmurs, rubs, gallops or clicks. No pedal edema. Gastrointestinal system: Abdomen is nondistended, soft and nontender. No organomegaly or masses felt. Normal bowel sounds heard. Central nervous system: Alert and oriented. No focal neurological deficits. Extremities: Symmetric 5 x 5 power. Skin: No rashes, lesions or ulcers Psychiatry: Judgement and insight appear normal. Mood & affect appropriate.     Data Reviewed: I have personally reviewed following labs and imaging studies  CBC: Recent Labs  Lab 04/27/18 0324 05/02/18 1612 05/02/18 2242 05/03/18 0558  WBC 8.1 5.9 6.3 6.0  HGB 11.4* 9.1* 9.6* 10.6*  HCT 37.0* 29.3* 32.0* 34.2*  MCV 88.5 93.0 90.7 89.3  PLT 201 160 173 762   Basic Metabolic Panel: Recent Labs  Lab 04/27/18 0324 05/02/18 1612 05/02/18 2242 05/03/18 0558  NA 135 134*  --  139  K 3.9 3.2*  --  3.5  CL 100 103  --  105  CO2 27 21*  --  20*  GLUCOSE 105* 98  --  86  BUN 15 57*  --  47*  CREATININE 1.60* 3.27* 2.87* 2.66*  CALCIUM 9.5 9.1  --  9.3  MG  --   --   --  2.2   GFR: Estimated Creatinine Clearance: 16 mL/min (A) (by C-G formula based on SCr of 2.66 mg/dL (H)). Liver Function Tests: Recent Labs  Lab 05/02/18 1612  AST 18  ALT 14  ALKPHOS 82  BILITOT 0.5  PROT 7.2  ALBUMIN 3.8   No results for input(s): LIPASE, AMYLASE in the last 168 hours. No results for input(s): AMMONIA in the last 168 hours. Coagulation  Profile: Recent Labs  Lab 05/03/18 0558  INR 1.16   Cardiac Enzymes: No results for input(s): CKTOTAL, CKMB, CKMBINDEX, TROPONINI in the last 168 hours. BNP (last 3 results) No results for input(s): PROBNP in the last 8760 hours. HbA1C: No results for input(s): HGBA1C in the last 72 hours. CBG: No results for input(s): GLUCAP in the last 168 hours. Lipid Profile: No results for input(s): CHOL, HDL, LDLCALC, TRIG, CHOLHDL, LDLDIRECT in the last 72 hours. Thyroid Function Tests: No results for input(s): TSH, T4TOTAL, FREET4, T3FREE, THYROIDAB in the last 72 hours. Anemia Panel: No results for input(s): VITAMINB12, FOLATE, FERRITIN, TIBC, IRON, RETICCTPCT in the last 72 hours. Sepsis Labs: Recent Labs  Lab 05/02/18 1732  LATICACIDVEN 2.43*    Recent Results (from the past 240 hour(s))  Gram stain     Status: None   Collection Time: 04/24/18 12:28 PM  Result Value Ref Range Status   Specimen Description PLEURAL LEFT  Final   Special Requests NONE  Final   Gram Stain   Final    RARE WBC PRESENT, PREDOMINANTLY PMN NO ORGANISMS SEEN Performed at [facility], [place] [address], [place], [place] [address]    Report Status 04/24/2018 FINAL  Final  Culture, body fluid-bottle     Status: None   Collection Time: 04/24/18 12:28 PM  Result Value Ref Range Status   Specimen Description PLEURAL LEFT  Final   Special Requests NONE  Final   Culture   Final    NO GROWTH 5 DAYS Performed at [facility] [address], [place], [place] [address]    Report Status 04/29/2018 FINAL  Final         Radiology Studies: Dg Chest 2 View  Result Date: 05/02/2018 CLINICAL DATA:  Cough and hypotension today. History of prostate cancer. EXAM: CHEST - 2 VIEW COMPARISON:  CT chest 04/25/2018. Single-view of the chest 04/24/2018. FINDINGS: Small to moderate left pleural effusion has decreased in size since the prior examination. There is compressive atelectasis in the left lung base.  The right lung is clear. No right effusion. No pneumothorax. Scattered sclerotic bony lesions consistent with metastatic prostate carcinoma noted. IMPRESSION: Small to moderate left pleural effusion has decreased in size since the most recent examination. No new abnormality. Metastatic prostate carcinoma. Electronically Signed   By: Inge Rise M.D.   On: 05/02/2018 16:11        Scheduled Meds: . amLODipine  10 mg Oral Daily  . aspirin EC  81 mg Oral Daily  . cloNIDine  0.1 mg Oral BID  . clopidogrel  75 mg Oral Daily  . heparin  5,000 Units Subcutaneous Q8H  . levETIRAcetam  750 mg Oral BID  . levothyroxine  112 mcg Oral Q24H  . memantine  10 mg Oral BID  . pantoprazole  40 mg Oral Daily   Continuous Infusions: . sodium chloride Stopped (05/03/18 1140)     LOS: 1 day    Time spent: 40 minutes    Irine Seal, MD [facility] Pager [phone]  If 7PM-7AM, please contact night-coverage [email or web address] Password [facility] 05/03/2018, 3:38 PM

## 2018-05-04 DIAGNOSIS — D638 Anemia in other chronic diseases classified elsewhere: Secondary | ICD-10-CM

## 2018-05-04 DIAGNOSIS — E538 Deficiency of other specified B group vitamins: Secondary | ICD-10-CM

## 2018-05-04 LAB — CBC WITH DIFFERENTIAL/PLATELET
Abs Immature Granulocytes: 0.01 10*3/uL (ref 0.00–0.07)
Basophils Absolute: 0.1 10*3/uL (ref 0.0–0.1)
Basophils Relative: 2 %
Eosinophils Absolute: 0.4 10*3/uL (ref 0.0–0.5)
Eosinophils Relative: 9 %
HCT: 26.6 % — ABNORMAL LOW (ref 39.0–52.0)
Hemoglobin: 8.1 g/dL — ABNORMAL LOW (ref 13.0–17.0)
Immature Granulocytes: 0 %
Lymphocytes Relative: 36 %
Lymphs Abs: 1.7 10*3/uL (ref 0.7–4.0)
MCH: 27.1 pg (ref 26.0–34.0)
MCHC: 30.5 g/dL (ref 30.0–36.0)
MCV: 89 fL (ref 80.0–100.0)
Monocytes Absolute: 0.4 10*3/uL (ref 0.1–1.0)
Monocytes Relative: 9 %
Neutro Abs: 2 10*3/uL (ref 1.7–7.7)
Neutrophils Relative %: 44 %
Platelets: 153 10*3/uL (ref 150–400)
RBC: 2.99 MIL/uL — AB (ref 4.22–5.81)
RDW: 13.8 % (ref 11.5–15.5)
WBC: 4.6 10*3/uL (ref 4.0–10.5)
nRBC: 0 % (ref 0.0–0.2)

## 2018-05-04 LAB — RETICULOCYTES
Immature Retic Fract: 7.9 % (ref 2.3–15.9)
RBC.: 3.39 MIL/uL — ABNORMAL LOW (ref 4.22–5.81)
RETIC COUNT ABSOLUTE: 86.4 10*3/uL (ref 19.0–186.0)
Retic Ct Pct: 2.6 % (ref 0.4–3.1)

## 2018-05-04 LAB — LACTIC ACID, PLASMA: Lactic Acid, Venous: 1.4 mmol/L (ref 0.5–1.9)

## 2018-05-04 LAB — FOLATE: Folate: 5.4 ng/mL — ABNORMAL LOW (ref >5.9)

## 2018-05-04 LAB — BASIC METABOLIC PANEL
Anion gap: 9 (ref 5–15)
BUN: 39 mg/dL — AB (ref 8–23)
CO2: 21 mmol/L — ABNORMAL LOW (ref 22–32)
Calcium: 8.6 mg/dL — ABNORMAL LOW (ref 8.9–10.3)
Chloride: 107 mmol/L (ref 98–111)
Creatinine, Ser: 2.21 mg/dL — ABNORMAL HIGH (ref 0.61–1.24)
GFR calc Af Amer: 28 mL/min — ABNORMAL LOW (ref >60)
GFR calc non Af Amer: 24 mL/min — ABNORMAL LOW (ref >60)
Glucose, Bld: 106 mg/dL — ABNORMAL HIGH (ref 70–99)
Potassium: 3.5 mmol/L (ref 3.5–5.1)
SODIUM: 137 mmol/L (ref 135–145)

## 2018-05-04 LAB — VITAMIN B12: VITAMIN B 12: 3083 pg/mL — AB (ref 180–914)

## 2018-05-04 LAB — IRON AND TIBC
Iron: 91 ug/dL (ref 45–182)
Saturation Ratios: 41 % — ABNORMAL HIGH (ref 17.9–39.5)
TIBC: 223 ug/dL — ABNORMAL LOW (ref 250–450)
UIBC: 132 ug/dL

## 2018-05-04 LAB — FERRITIN: Ferritin: 227 ng/mL (ref 24–336)

## 2018-05-04 LAB — HEMOGLOBIN AND HEMATOCRIT, BLOOD
HCT: 30.7 % — ABNORMAL LOW (ref 39.0–52.0)
Hemoglobin: 9.5 g/dL — ABNORMAL LOW (ref 13.0–17.0)

## 2018-05-04 MED ORDER — FOLIC ACID 1 MG PO TABS
1.0000 mg | ORAL_TABLET | Freq: Every day | ORAL | Status: DC
  Administered 2018-05-04 – 2018-05-13 (×10): 1 mg via ORAL
  Filled 2018-05-04 (×10): qty 1

## 2018-05-04 NOTE — Plan of Care (Signed)
  Problem: Pain Managment: Goal: General experience of comfort will improve Outcome: Progressing Note:  Pt denies any pain at this time- has PRN regimen if needed

## 2018-05-04 NOTE — Progress Notes (Signed)
PROGRESS NOTE    [name]  MLY:[id number] DOB: 1921/05/08 DOA: 05/02/2018 PCP: [name], MD   Brief Narrative:  This is a 84 y/o male who a h/o of prostate cancer, recently diagnosed with lung mets. He he was d/c from [facility] last Monday. He has been doing, eating and drinking ok. Today it was noted that the patients blood pressure was soft. His SBP was in the 90's. Later his SBP dropped furthur to 60/40, 911 was called and he was sent to the ER. There is no report of fevers or chills, no nausea or vomiting. He denies any pain. He states his UOP is unchanged, no decrease in his UOP. He is not light headed or dizzy. He denies any BOU. Per his daughter he has been having liquid only stools since Tuesday. Clear watery diarrhea, no evidence of bleeding. Patient occult negative in ER,  In the ER his creatine was noted to have increased from 1.6 to 3.27. Request for admission was made. The family requested transfer to [place].  History provided by the patient and family members present at bedside.   Assessment & Plan:   Principal Problem:   Acute kidney injury superimposed on chronic kidney disease ([place]) Active Problems:   Hypothyroidism   HTN (hypertension)   Seizure disorder (HCC)   Hypokalemia   Dementia (HCC)   Prostate cancer ([place])   Lung metastases ([place])   Acute-on-chronic kidney injury ([place])   Pressure injury of skin   Hypotension  #1 acute renal failure on chronic kidney disease stage III Likely secondary to prerenal azotemia as patient noted to be hypotensive on presentation in the setting of diuretics and ARB.  Patient also noted to have had some loose stools prior to admission per admitting physician however family denies any significant loose stools.  Renal function improving with hydration.  Creatinine currently at 2.21 from 3.27 on admission.  Continue IV fluids.  Continue to hold diuretics and ARB.  ARB was to be discontinued on prior discharge.  Follow..   Check a urine sodium.  Check a urine creatinine.  Creatinine on admission was 3.27.   2.  Hypotension Likely secondary to hypovolemia.  Blood pressure responding to IV fluids.  Patient afebrile.  EKG with no ischemic changes.  Urinalysis is nitrite negative leukocytes negative.  Chest x-ray negative for any acute infiltrate.  C. difficile PCR pending.  Continue IV fluids.  Monitor closely while on antihypertensive medications.  Follow.  3.  Diarrhea Questionable etiology.  Patient with no loose stools this morning.  Family questioning as to whether patient did have stools.  Patient on the with some mucus in stool.  C. difficile PCR and GI pathogen panel pending.  Supportive care.  4.  Hypothyroidism TSH on 04/24/2018 was 8.377.  Continue home regimen Synthroid.  Will need repeat thyroid function studies done in about 4 to 6 weeks.  5.  Hypertension Blood pressure improving with hydration.  Patient was unable to get good IV access for most of the day yesterday.  Patient now with IV access.  On Norvasc and clonidine.  Follow.  If blood pressure remains borderline may need to discontinue antihypertensive medications.  6.  Lactic acidosis Likely secondary to dehydration.  Urinalysis unremarkable.  Chest x-ray negative for any acute infiltrate.  Patient currently afebrile.  Continue IV fluids.  Repeat lactic acid level with a decreasing elevated lactic acid.  Continue hydration.   7.  History of metastatic prostate cancer/malignant pleural effusion Outpatient follow-up  with oncology.  8.  Dementia Stable.  Continue home regimen of Namenda..  9.  Seizures Patient with no signs of seizures.  Continue home regimen of Keppra.  10.  Hypokalemia Potassium repleted.    11.  Stage I pressure injury of the sacrum Continue current wound care.  12.  Anemia of chronic disease/folate deficiency Patient noted to have a hemoglobin of 8.1 from 9.1 on admission.  Likely dilutional.  Patient with no overt  bleeding.  Anemia panel consistent with anemia of chronic disease.  Folate levels decreased at 5.4.  Place on folic acid 1 mg daily.  Follow H&H.     DVT prophylaxis: Heparin Code Status: Full Family Communication: Updated patient and daughters at bedside. Disposition Plan: To be determined.   Consultants:   None  Procedures:   Chest x-ray 05/02/2018    Antimicrobials:   None   Subjective: Sitting up in bed.  Just finished eating lunch.  No significant loose stools per family at bedside.  Patient noted to have some small mucousy stool.  No chest pain.  No abdominal pain.  No shortness of breath.  Patient now with IV access.   Objective: Vitals:   05/03/18 2121 05/04/18 0538 05/04/18 1046 05/04/18 1048  BP: (!) 99/59 108/66 116/62 116/62  Pulse: (!) 58 (!) 50  (!) 58  Resp: 16 16    Temp: 97.6 F (36.4 C) 98.3 F (36.8 C)  98.1 F (36.7 C)  TempSrc: Oral   Oral  SpO2: 99% 100%  99%  Weight:      Height:        Intake/Output Summary (Last 24 hours) at 05/04/2018 1320 Last data filed at 05/04/2018 1045 Gross per 24 hour  Intake 793.38 ml  Output 200 ml  Net 593.38 ml   Filed Weights   05/02/18 1522  Weight: 68 kg    Examination:  General exam: NAD  Respiratory system: CTAB.  No wheezes, no crackles, no rhonchi.  Respiratory effort normal. Cardiovascular system: RRR no murmurs rubs or gallops.  No JVD.  No lower extremity edema. Gastrointestinal system: Abdomen is soft, nontender, nondistended, positive bowel sounds.  No rebound.  No guarding.  Central nervous system: Alert and oriented. No focal neurological deficits. Extremities: Symmetric 5 x 5 power. Skin: No rashes, lesions or ulcers Psychiatry: Judgement and insight appear normal. Mood & affect appropriate.     Data Reviewed: I have personally reviewed following labs and imaging studies  CBC: Recent Labs  Lab 05/02/18 1612 05/02/18 2242 05/03/18 0558 05/04/18 0407 05/04/18 0941  WBC 5.9  6.3 6.0 4.6  --   NEUTROABS  --   --   --  2.0  --   HGB 9.1* 9.6* 10.6* 8.1* 9.5*  HCT 29.3* 32.0* 34.2* 26.6* 30.7*  MCV 93.0 90.7 89.3 89.0  --   PLT 160 173 150 153  --    Basic Metabolic Panel: Recent Labs  Lab 05/02/18 1612 05/02/18 2242 05/03/18 0558 05/04/18 0407  NA 134*  --  139 137  K 3.2*  --  3.5 3.5  CL 103  --  105 107  CO2 21*  --  20* 21*  GLUCOSE 98  --  86 106*  BUN 57*  --  47* 39*  CREATININE 3.27* 2.87* 2.66* 2.21*  CALCIUM 9.1  --  9.3 8.6*  MG  --   --  2.2  --    GFR: Estimated Creatinine Clearance: 19.2 mL/min (A) (by C-G formula based on  SCr of 2.21 mg/dL (H)). Liver Function Tests: Recent Labs  Lab 05/02/18 1612  AST 18  ALT 14  ALKPHOS 82  BILITOT 0.5  PROT 7.2  ALBUMIN 3.8   No results for input(s): LIPASE, AMYLASE in the last 168 hours. No results for input(s): AMMONIA in the last 168 hours. Coagulation Profile: Recent Labs  Lab 05/03/18 0558  INR 1.16   Cardiac Enzymes: No results for input(s): CKTOTAL, CKMB, CKMBINDEX, TROPONINI in the last 168 hours. BNP (last 3 results) No results for input(s): PROBNP in the last 8760 hours. HbA1C: No results for input(s): HGBA1C in the last 72 hours. CBG: No results for input(s): GLUCAP in the last 168 hours. Lipid Profile: No results for input(s): CHOL, HDL, LDLCALC, TRIG, CHOLHDL, LDLDIRECT in the last 72 hours. Thyroid Function Tests: No results for input(s): TSH, T4TOTAL, FREET4, T3FREE, THYROIDAB in the last 72 hours. Anemia Panel: Recent Labs    05/04/18 0941 05/04/18 0952  VITAMINB12 3,083*  --   FOLATE  --  5.4*  FERRITIN 227  --   TIBC 223*  --   IRON 91  --   RETICCTPCT 2.6  --    Sepsis Labs: Recent Labs  Lab 05/02/18 1732 05/04/18 0407  LATICACIDVEN 2.43* 1.4    No results found for this or any previous visit (from the past 240 hour(s)).       Radiology Studies: Dg Chest 2 View  Result Date: 05/02/2018 CLINICAL DATA:  Cough and hypotension today.  History of prostate cancer. EXAM: CHEST - 2 VIEW COMPARISON:  CT chest 04/25/2018. Single-view of the chest 04/24/2018. FINDINGS: Small to moderate left pleural effusion has decreased in size since the prior examination. There is compressive atelectasis in the left lung base. The right lung is clear. No right effusion. No pneumothorax. Scattered sclerotic bony lesions consistent with metastatic prostate carcinoma noted. IMPRESSION: Small to moderate left pleural effusion has decreased in size since the most recent examination. No new abnormality. Metastatic prostate carcinoma. Electronically Signed   By: Inge Rise M.D.   On: 05/02/2018 16:11        Scheduled Meds: . amLODipine  10 mg Oral Daily  . aspirin EC  81 mg Oral Daily  . cloNIDine  0.1 mg Oral BID  . clopidogrel  75 mg Oral Daily  . folic acid  1 mg Oral Daily  . heparin  5,000 Units Subcutaneous Q8H  . levETIRAcetam  750 mg Oral BID  . levothyroxine  112 mcg Oral Q24H  . memantine  10 mg Oral BID  . pantoprazole  40 mg Oral Daily   Continuous Infusions: . sodium chloride 75 mL/hr at 05/04/18 1045     LOS: 2 days    Time spent: 40 minutes    Irine Seal, MD [facility] Pager [phone]  If 7PM-7AM, please contact night-coverage [email or web address] Password [email or web address] 05/04/2018, 1:20 PM

## 2018-05-05 LAB — BASIC METABOLIC PANEL
Anion gap: 6 (ref 5–15)
BUN: 30 mg/dL — ABNORMAL HIGH (ref 8–23)
CO2: 22 mmol/L (ref 22–32)
Calcium: 8.9 mg/dL (ref 8.9–10.3)
Chloride: 111 mmol/L (ref 98–111)
Creatinine, Ser: 1.84 mg/dL — ABNORMAL HIGH (ref 0.61–1.24)
GFR, EST AFRICAN AMERICAN: 35 mL/min — AB (ref >60)
GFR, EST NON AFRICAN AMERICAN: 30 mL/min — AB (ref >60)
Glucose, Bld: 117 mg/dL — ABNORMAL HIGH (ref 70–99)
POTASSIUM: 3.8 mmol/L (ref 3.5–5.1)
Sodium: 139 mmol/L (ref 135–145)

## 2018-05-05 LAB — CBC WITH DIFFERENTIAL/PLATELET
Abs Immature Granulocytes: 0.01 10*3/uL (ref 0.00–0.07)
BASOS ABS: 0.1 10*3/uL (ref 0.0–0.1)
Basophils Relative: 2 %
Eosinophils Absolute: 0.5 10*3/uL (ref 0.0–0.5)
Eosinophils Relative: 8 %
HCT: 30.1 % — ABNORMAL LOW (ref 39.0–52.0)
Hemoglobin: 9.1 g/dL — ABNORMAL LOW (ref 13.0–17.0)
Immature Granulocytes: 0 %
Lymphocytes Relative: 37 %
Lymphs Abs: 2.3 10*3/uL (ref 0.7–4.0)
MCH: 27.5 pg (ref 26.0–34.0)
MCHC: 30.2 g/dL (ref 30.0–36.0)
MCV: 90.9 fL (ref 80.0–100.0)
Monocytes Absolute: 0.5 10*3/uL (ref 0.1–1.0)
Monocytes Relative: 9 %
Neutro Abs: 2.8 10*3/uL (ref 1.7–7.7)
Neutrophils Relative %: 44 %
PLATELETS: 144 10*3/uL — AB (ref 150–400)
RBC: 3.31 MIL/uL — ABNORMAL LOW (ref 4.22–5.81)
RDW: 13.7 % (ref 11.5–15.5)
WBC: 6.3 10*3/uL (ref 4.0–10.5)
nRBC: 0 % (ref 0.0–0.2)

## 2018-05-05 MED ORDER — LEVETIRACETAM 500 MG PO TABS
500.0000 mg | ORAL_TABLET | Freq: Two times a day (BID) | ORAL | Status: DC
  Administered 2018-05-05 – 2018-05-13 (×16): 500 mg via ORAL
  Filled 2018-05-05 (×16): qty 1

## 2018-05-05 NOTE — NC FL2 (Signed)
[place] LEVEL OF CARE SCREENING TOOL     IDENTIFICATION  Patient Name: [name] Birthdate: [birth year] Sex: male Admission Date (Current Location): 05/02/2018  [facility] and [place] Number:  Herbalist and Address:  [facility], [place] [address] [place], [place] [address]      Provider Number: [id number]  Attending Physician Name and Address:  Eugenie Filler, MD  Relative Name and Phone Number:  Peter [place] daughter, [phone]    Current Level of Care: Hospital Recommended Level of Care: [place] Prior Approval Number:    Date Approved/Denied:   PASRR Number: [id number]  Discharge Plan: SNF    Current Diagnoses: Patient Active Problem List   Diagnosis Date Noted  . Pressure injury of skin 05/03/2018  . Hypotension   . Acute kidney injury superimposed on chronic kidney disease ([place]) 05/02/2018  . Prostate cancer ([place]) 05/02/2018  . Lung metastases ([place]) 05/02/2018  . Acute-on-chronic kidney injury ([place]) 05/02/2018  . Moderate sized pleural effusion 04/24/2018  . Hyponatremia 04/24/2018  . AKI (acute kidney injury) ([place]) 04/24/2018  . CKD (chronic kidney disease) stage 3, GFR 30-59 ml/min (HCC) 04/24/2018  . Chronic anemia 04/24/2018  . Physical deconditioning 04/24/2018  . CVA (cerebral vascular accident) ([place]) 04/24/2018  . Neuropathy 04/24/2018  . Anxiety 04/24/2018  . Dementia ([place]) 04/24/2018  . Prostate cancer metastatic to bone ([place]) 04/24/2018  . Bilateral lower extremity edema 04/23/2018  . Other dysphagia 12/29/2012  . Generalized weakness 12/22/2012  . ARF (acute renal failure) ([place]) 12/22/2012  . Weight loss 12/22/2012  . Urinary retention 12/22/2012  . Leg swelling 12/22/2012  . Transaminitis 12/22/2012  . Hypokalemia 12/22/2012  . Seizure disorder ([place]) 10/02/2012  . Disc degeneration, lumbosacral 10/16/2011  . Pain, hip 09/26/2011  . Left hip pain 09/26/2011  . Leg  pain 09/26/2011  . Lumbosacral neuritis 09/26/2011  . DJD (degenerative joint disease) of hip 09/26/2011  . Peripheral vascular disease ([place]) 09/26/2011  . ECZEMA 01/06/2008  . COUGH 08/08/2007  . Hypothyroidism 06/25/2007  . CATARACTS, BILATERAL 06/25/2007  . HTN (hypertension) 06/25/2007  . ALLERGIC RHINITIS 06/25/2007  . ARTHRITIS 06/25/2007  . SEIZURE DISORDER 06/25/2007  . PROSTATE CANCER, HX OF 06/25/2007    Orientation RESPIRATION BLADDER Height & Weight     Self, Time, Situation, Place  Normal Continent, External catheter Weight: 68.1 kg Height:  5\' 10"  (177.8 cm)  BEHAVIORAL SYMPTOMS/MOOD NEUROLOGICAL BOWEL NUTRITION STATUS      Continent Diet(Please see DC Summary)  AMBULATORY STATUS COMMUNICATION OF NEEDS Skin     Verbally Normal                       Personal Care Assistance Level of Assistance  Bathing, Feeding, Dressing Bathing Assistance: Limited assistance Feeding assistance: Limited assistance Dressing Assistance: Limited assistance     Functional Limitations Info  Sight, Hearing, Speech Sight Info: Adequate Hearing Info: Adequate Speech Info: Adequate    SPECIAL CARE FACTORS FREQUENCY  PT (By licensed PT), OT (By licensed OT)     PT Frequency: 5x/week OT Frequency: 3x/week            Contractures Contractures Info: Not present    Additional Factors Info  Allergies, Code Status, Psychotropic Code Status Info: Full Allergies Info: Aspirin Psychotropic Info: Clonodine         Current Medications (05/05/2018):  This is the current hospital active medication list Current Facility-Administered Medications  Medication Dose Route  Frequency Provider Last Rate Last Dose  . 0.9 %  sodium chloride infusion   Intravenous Continuous Crosley, Debby, MD 75 mL/hr at 05/05/18 0600    . amLODipine (NORVASC) tablet 10 mg  10 mg Oral Daily Quintella Baton, MD   10 mg at 05/05/18 0940  . aspirin EC tablet 81 mg  81 mg Oral Daily Quintella Baton, MD   81  mg at 05/05/18 0939  . cloNIDine (CATAPRES) tablet 0.1 mg  0.1 mg Oral BID Claria Dice, Debby, MD   0.1 mg at 05/05/18 0940  . clopidogrel (PLAVIX) tablet 75 mg  75 mg Oral Daily Quintella Baton, MD   75 mg at 05/05/18 0939  . folic acid (FOLVITE) tablet 1 mg  1 mg Oral Daily Eugenie Filler, MD   1 mg at 05/05/18 0940  . heparin injection 5,000 Units  5,000 Units Subcutaneous Q8H Quintella Baton, MD   5,000 Units at 05/05/18 0554  . hydrALAZINE (APRESOLINE) injection 5 mg  5 mg Intravenous Q6H PRN Crosley, Debby, MD      . levETIRAcetam (KEPPRA) tablet 500 mg  500 mg Oral BID Eugenie Filler, MD      . levothyroxine (SYNTHROID, LEVOTHROID) tablet 112 mcg  112 mcg Oral Q24H Quintella Baton, MD   112 mcg at 05/05/18 0553  . LORazepam (ATIVAN) tablet 0.5 mg  0.5 mg Oral BID PRN Quintella Baton, MD      . memantine (NAMENDA) tablet 10 mg  10 mg Oral BID Quintella Baton, MD   10 mg at 05/05/18 0939  . pantoprazole (PROTONIX) EC tablet 40 mg  40 mg Oral Daily Quintella Baton, MD   40 mg at 05/05/18 4665     Discharge Medications: Please see discharge summary for a list of discharge medications.  Relevant Imaging Results:  Relevant Lab Results:   Additional Information [facility], [place]

## 2018-05-05 NOTE — Progress Notes (Addendum)
PROGRESS NOTE    [name]  GUR:[id number] DOB: [birth year] DOA: 05/02/2018 PCP: [name], MD   Brief Narrative:  This is a 84 y/o male who a h/o of prostate cancer, recently diagnosed with lung mets. He he was d/c from [facility] last Monday. He has been doing, eating and drinking ok. Today it was noted that the patients blood pressure was soft. His SBP was in the 90's. Later his SBP dropped furthur to 60/40, 911 was called and he was sent to the ER. There is no report of fevers or chills, no nausea or vomiting. He denies any pain. He states his UOP is unchanged, no decrease in his UOP. He is not light headed or dizzy. He denies any BOU. Per his daughter he has been having liquid only stools since Tuesday. Clear watery diarrhea, no evidence of bleeding. Patient occult negative in ER,  In the ER his creatine was noted to have increased from 1.6 to 3.27. Request for admission was made. The family requested transfer to [place].  History provided by the patient and family members present at bedside.   Assessment & Plan:   Principal Problem:   Acute kidney injury superimposed on chronic kidney disease ([place]) Active Problems:   Hypothyroidism   HTN (hypertension)   Seizure disorder (HCC)   Hypokalemia   Dementia (HCC)   Prostate cancer ([place])   Lung metastases ([place])   Acute-on-chronic kidney injury ([place])   Pressure injury of skin   Hypotension  #1 acute renal failure on chronic kidney disease stage III Likely secondary to prerenal azotemia as patient noted to be hypotensive on presentation in the setting of diuretics and ARB.  Patient also noted to have had some loose stools prior to admission per admitting physician however family denies any significant loose stools.  Renal function improving with hydration.  Creatinine currently at 1.84 from 2.21 from 3.27 on admission.  Renal function close to baseline.  Continue IV fluids for 1 more day and saline lock tomorrow.  Continue to  hold diuretics and ARB.  ARB was to be discontinued on prior discharge.  Follow.  2.  Hypotension Likely secondary to hypovolemia.  Blood pressure improved with hydration.  Afebrile.  EKG with no ischemic changes.  Urinalysis is nitrite negative leukocytes negative.  Chest x-ray negative for any acute infiltrate.  C. difficile PCR pending.  Continue IV fluids.  Monitor closely while on antihypertensive medications.  Follow.  3.  Diarrhea Questionable etiology.  Patient with no loose stools this morning.  Family questioning as to whether patient did have loose stools.  Patient with some mucus in stool.  C. difficile PCR and GI pathogen panel pending.  Discontinue enteric precautions.  Supportive care.  4.  Hypothyroidism TSH on 04/24/2018 was 8.377.  Continue home regimen Synthroid.  Will need repeat thyroid function studies done in about 4 to 6 weeks.  5.  Hypertension Blood pressure improved with hydration.  Patient was unable to get good IV access for most of the day on 05/03/2018.  Patient now with IV access.  Continue home regimen Norvasc and clonidine.  6.  Lactic acidosis Likely secondary to dehydration.  Urinalysis unremarkable.  Chest x-ray negative for any acute infiltrate.  Patient currently afebrile.  Repeat lactic acid level at 1.4.  Continue gentle hydration.  Follow.    7.  History of metastatic prostate cancer/malignant pleural effusion Outpatient follow-up with oncology.  8.  Dementia Stable.  Continue home regimen of Namenda.Marland Kitchen  9.  Seizures Patient with no signs of seizures.  Decrease Keppra dose to 500 mg twice daily due to renal function.  Pharmacy following.   10.  Hypokalemia Repleted.  Potassium at 3.8.    11.  Stage I pressure injury of the sacrum Continue current wound care.  12.  Anemia of chronic disease/folate deficiency Hemoglobin currently at 9.1 from 9.5 from 8.1 from 9.1 on admission.  Likely dilutional.  Patient with no overt bleeding.  Anemia panel  consistent with anemia of chronic disease.  Folate levels decreased at 5.4.  Continue folic acid 1 mg daily.  Follow H&H.  13.  Dysuria Check a UA with cultures and sensitivities.     DVT prophylaxis: Heparin Code Status: Full Family Communication: Updated patient and daughter at bedside. Disposition Plan: SNF versus home with home health.   Consultants:   None  Procedures:   Chest x-ray 05/02/2018    Antimicrobials:   None   Subjective: Patient sitting up in bed.  Eating lunch.  Complaining of pain with urination.  No chest pain.  No shortness of breath.  No diarrhea.    Objective: Vitals:   05/04/18 1048 05/04/18 2145 05/05/18 0505 05/05/18 0932  BP: 116/62 (!) 124/55 127/65 (!) 144/67  Pulse: (!) 58 (!) 52 (!) 47 (!) 53  Resp:  20 15 18   Temp: 98.1 F (36.7 C) (!) 97.5 F (36.4 C) 97.7 F (36.5 C) 98.2 F (36.8 C)  TempSrc: Oral Oral Oral Oral  SpO2: 99% 100% 100% 100%  Weight:  68.1 kg    Height:        Intake/Output Summary (Last 24 hours) at 05/05/2018 1336 Last data filed at 05/05/2018 1300 Gross per 24 hour  Intake 2889.62 ml  Output 1350 ml  Net 1539.62 ml   Filed Weights   05/02/18 1522 05/04/18 2145  Weight: 68 kg 68.1 kg    Examination:  General exam: Sitting up.  Comfortable.  No acute distress. Respiratory system: Lungs clear to auscultation bilaterally.  No wheezes, no crackles, no rhonchi.  Cardiovascular system: Regular rate rhythm no murmurs rubs or gallops.  No JVD.  No lower extremity edema.  Gastrointestinal system: Abdomen is nontender, nondistended, soft, positive bowel sounds.  No rebound.  No guarding.   Central nervous system: Alert and oriented. No focal neurological deficits. Extremities: Symmetric 5 x 5 power. Skin: No rashes, lesions or ulcers Psychiatry: Judgement and insight appear normal. Mood & affect appropriate.     Data Reviewed: I have personally reviewed following labs and imaging studies  CBC: Recent  Labs  Lab 05/02/18 1612 05/02/18 2242 05/03/18 0558 05/04/18 0407 05/04/18 0941 05/05/18 0834  WBC 5.9 6.3 6.0 4.6  --  6.3  NEUTROABS  --   --   --  2.0  --  2.8  HGB 9.1* 9.6* 10.6* 8.1* 9.5* 9.1*  HCT 29.3* 32.0* 34.2* 26.6* 30.7* 30.1*  MCV 93.0 90.7 89.3 89.0  --  90.9  PLT 160 173 150 153  --  220*   Basic Metabolic Panel: Recent Labs  Lab 05/02/18 1612 05/02/18 2242 05/03/18 0558 05/04/18 0407 05/05/18 0834  NA 134*  --  139 137 139  K 3.2*  --  3.5 3.5 3.8  CL 103  --  105 107 111  CO2 21*  --  20* 21* 22  GLUCOSE 98  --  86 106* 117*  BUN 57*  --  47* 39* 30*  CREATININE 3.27* 2.87* 2.66* 2.21* 1.84*  CALCIUM 9.1  --  9.3 8.6* 8.9  MG  --   --  2.2  --   --    GFR: Estimated Creatinine Clearance: 23.1 mL/min (A) (by C-G formula based on SCr of 1.84 mg/dL (H)). Liver Function Tests: Recent Labs  Lab 05/02/18 1612  AST 18  ALT 14  ALKPHOS 82  BILITOT 0.5  PROT 7.2  ALBUMIN 3.8   No results for input(s): LIPASE, AMYLASE in the last 168 hours. No results for input(s): AMMONIA in the last 168 hours. Coagulation Profile: Recent Labs  Lab 05/03/18 0558  INR 1.16   Cardiac Enzymes: No results for input(s): CKTOTAL, CKMB, CKMBINDEX, TROPONINI in the last 168 hours. BNP (last 3 results) No results for input(s): PROBNP in the last 8760 hours. HbA1C: No results for input(s): HGBA1C in the last 72 hours. CBG: No results for input(s): GLUCAP in the last 168 hours. Lipid Profile: No results for input(s): CHOL, HDL, LDLCALC, TRIG, CHOLHDL, LDLDIRECT in the last 72 hours. Thyroid Function Tests: No results for input(s): TSH, T4TOTAL, FREET4, T3FREE, THYROIDAB in the last 72 hours. Anemia Panel: Recent Labs    05/04/18 0941 05/04/18 0952  VITAMINB12 3,083*  --   FOLATE  --  5.4*  FERRITIN 227  --   TIBC 223*  --   IRON 91  --   RETICCTPCT 2.6  --    Sepsis Labs: Recent Labs  Lab 05/02/18 1732 05/04/18 0407  LATICACIDVEN 2.43* 1.4    No  results found for this or any previous visit (from the past 240 hour(s)).       Radiology Studies: No results found.      Scheduled Meds: . amLODipine  10 mg Oral Daily  . aspirin EC  81 mg Oral Daily  . cloNIDine  0.1 mg Oral BID  . clopidogrel  75 mg Oral Daily  . folic acid  1 mg Oral Daily  . heparin  5,000 Units Subcutaneous Q8H  . levETIRAcetam  500 mg Oral BID  . levothyroxine  112 mcg Oral Q24H  . memantine  10 mg Oral BID  . pantoprazole  40 mg Oral Daily   Continuous Infusions: . sodium chloride 75 mL/hr at 05/05/18 0600     LOS: 3 days    Time spent: 35 minutes    Irine Seal, MD [facility] Pager [phone]  If 7PM-7AM, please contact night-coverage [email or web address] Password [email or web address] 05/05/2018, 1:36 PM

## 2018-05-05 NOTE — Evaluation (Signed)
Occupational Therapy Evaluation Patient Details Name: [name] MRN: [id number] DOB: 05-Nov-1920 Today's Date: 05/05/2018    History of Present Illness Pt is a 84 y/o male admitted secondary to low BP. Also found to ave AKI superimposed on CKD. PMH includes HTN, prostate cancer, dementia, CVA, and CKD.    Clinical Impression   Pt currently completes selfcare tasks, toileting, and grooming at min assist level.  Feel he will benefit from acute care OT to further progress to supervision level or better.  Also recommend SNF for short term rehab as pt needs to be modified independent at discharge back home.  Daughter reports that he will not have physical assist and that the niece he lives with cannot provide any real assistance based on her current condition.     Follow Up Recommendations  Supervision/Assistance - 24 hour;SNF    Equipment Recommendations  Tub/shower bench       Precautions / Restrictions Precautions Precautions: Fall Restrictions Weight Bearing Restrictions: No      Mobility Bed Mobility Overal bed mobility: Needs Assistance Bed Mobility: Supine to Sit     Supine to sit: Min assist     General bed mobility comments: Min A for trunk elevation and assist with scooting hips to EOB. Pt very slow to move.   Transfers Overall transfer level: Needs assistance Equipment used: Rolling walker (2 wheeled) Transfers: Sit to/from Stand Sit to Stand: Min assist         General transfer comment: Min A For lift assist and steadying. Pt very slow to stand and required cues for safe hand placement.     Balance Overall balance assessment: Needs assistance Sitting-balance support: No upper extremity supported;Feet supported Sitting balance-Leahy Scale: Fair     Standing balance support: Bilateral upper extremity supported;During functional activity Standing balance-Leahy Scale: Poor Standing balance comment: Reliant on BUE support                             ADL either performed or assessed with clinical judgement   ADL Overall ADL's : Needs assistance/impaired Eating/Feeding: Independent   Grooming: Wash/dry hands;Minimal assistance;Standing   Upper Body Bathing: Supervision/ safety;Sitting   Lower Body Bathing: Minimal assistance;Sit to/from stand   Upper Body Dressing : Supervision/safety;Sitting(hospital gown only)   Lower Body Dressing: Minimal assistance;Sit to/from stand   Toilet Transfer: Minimal Teacher, English as a foreign language;Ambulation;RW   Toileting- Clothing Manipulation and Hygiene: Minimal assistance;Sit to/from stand       Functional mobility during ADLs: Rolling walker;Minimal assistance General ADL Comments: Pt with need for min assist with initial sit to stand from the bedside chair as well as from the 3:1.  Flexed posture noted in standing with need for min assist to complete toilet hygiene as well.      Vision Baseline Vision/History: No visual deficits Patient Visual Report: No change from baseline Vision Assessment?: No apparent visual deficits            Pertinent Vitals/Pain Pain Assessment: No/denies pain     Hand Dominance Right   Extremity/Trunk Assessment Upper Extremity Assessment Upper Extremity Assessment: RUE deficits/detail RUE Deficits / Details: Pt strength 4/5 in elbow with 3+/5 in the shoulders.  He demonstrates history of right hand dysfunction with decreased AROM of digit flexion and extension in digits 3-5. RUE Sensation: WNL RUE Coordination: decreased fine motor   Lower Extremity Assessment Lower Extremity Assessment: Defer to PT evaluation   Cervical / Trunk Assessment Cervical / Trunk  Assessment: Kyphotic   Communication Communication Communication: No difficulties   Cognition Arousal/Alertness: Awake/alert Behavior During Therapy: Agitated(slightly at times, but easily re-directed) Overall Cognitive Status: Within Functional Limits for tasks assessed                                  General Comments: Pt getting slightly agitated for asking if he was lightheaded/dizzy, and stated "you already asked me that." Explained why I asked pt a second time upon standing.    General Comments  Pt's daughter present throughout session. Very concerned about pt returning home alone.     Exercises Exercises: General Lower Extremity General Exercises - Lower Extremity Ankle Circles/Pumps: AROM;Both;10 reps        Home Living Family/patient expects to be discharged to:: Private residence Living Arrangements: Alone Available Help at Discharge: Family;Available PRN/intermittently Type of Home: House Home Access: Stairs to enter [employer] of Steps: 8 Entrance Stairs-Rails: Right Home Layout: One level     Bathroom Shower/Tub: Teacher, early years/pre: Handicapped height     Home Equipment: Shower seat;Bedside commode;Hospital bed(Simultaneous filing. User may not have seen previous data.)   Additional Comments: Has power wheelchiar but battery is dead      Prior Functioning/Environment Level of Independence: Independent                 OT Problem List: Decreased strength;Impaired balance (sitting and/or standing);Decreased activity tolerance;Decreased knowledge of use of DME or AE      OT Treatment/Interventions: Self-care/ADL training;Patient/family education;Balance training;Therapeutic activities;DME and/or AE instruction    OT Goals(Current goals can be found in the care plan section) Acute Rehab OT Goals Patient Stated Goal: to go to rehab to get stronger before going home per pt and pt's daughter.  Time For Goal Achievement: 05/19/18 Potential to Achieve Goals: Good  OT Frequency: Min 2X/week   Barriers to D/C: Decreased caregiver support             AM-PAC OT "6 Clicks" Daily Activity     Outcome Measure Help from another person eating meals?: None Help from another person taking care of  personal grooming?: A Little Help from another person toileting, which includes using toliet, bedpan, or urinal?: A Little Help from another person bathing (including washing, rinsing, drying)?: A Little Help from another person to put on and taking off regular upper body clothing?: A Little Help from another person to put on and taking off regular lower body clothing?: A Little 6 Click Score: 19   End of Session Equipment Utilized During Treatment: Rolling walker Nurse Communication: Mobility status  Activity Tolerance: Patient tolerated treatment well Patient left: in chair;with call bell/phone within reach;with chair alarm set  OT Visit Diagnosis: Unsteadiness on feet (R26.81);Muscle weakness (generalized) (M62.81)                Time: 9470-9628 OT Time Calculation (min): 49 min Charges:  OT General Charges $OT Visit: 1 Visit OT Evaluation $OT Eval Moderate Complexity: 1 Mod OT Treatments $Self Care/Home Management : 23-37 mins  Prestin Munch OTR/L 05/05/2018, 2:48 PM

## 2018-05-05 NOTE — Evaluation (Signed)
Physical Therapy Evaluation Patient Details Name: [name] MRN: [id number] DOB: 05-25-1921 Today's Date: 05/05/2018   History of Present Illness  Pt is a 84 y/o male admitted secondary to low BP. Also found to ave AKI superimposed on CKD. PMH includes HTN, prostate cancer, dementia, CVA, and CKD.   Clinical Impression  Pt admitted secondary to problem above with deficits below. Pt very slow and guarded to move and required min to min guard A for mobility this session. Pt's daughter reports pt's mobility has declined and pt has no one to assist 24/7 at home. Pt and pt's daughter interested in SNF at this time to increase independence and safety with mobility prior to return home. Will continue to follow acutely to maximize functional mobility independence and safety.     Follow Up Recommendations SNF;Supervision for mobility/OOB    Equipment Recommendations  None recommended by PT    Recommendations for Other Services       Precautions / Restrictions Precautions Precautions: Fall Restrictions Weight Bearing Restrictions: No      Mobility  Bed Mobility Overal bed mobility: Needs Assistance Bed Mobility: Supine to Sit     Supine to sit: Min assist     General bed mobility comments: Min A for trunk elevation and assist with scooting hips to EOB. Pt very slow to move.   Transfers Overall transfer level: Needs assistance Equipment used: Rolling walker (2 wheeled) Transfers: Sit to/from Stand Sit to Stand: Min assist         General transfer comment: Min A For lift assist and steadying. Pt very slow to stand and required cues for safe hand placement.   Ambulation/Gait Ambulation/Gait assistance: Min guard;Min assist Gait Distance (Feet): 50 Feet Assistive device: Rolling walker (2 wheeled) Gait Pattern/deviations: Step-through pattern;Decreased stride length;Trunk flexed Gait velocity: Decreased   General Gait Details: Slow, guarded gait. Unsteadiness noted  and required min to min guard A. Required cues for upright posture throughout. Pt's daughter reports pt is having more difficulty ambulating.   Stairs            Wheelchair Mobility    Modified Rankin (Stroke Patients Only)       Balance Overall balance assessment: Needs assistance Sitting-balance support: No upper extremity supported;Feet supported Sitting balance-Leahy Scale: Fair     Standing balance support: Bilateral upper extremity supported;During functional activity Standing balance-Leahy Scale: Poor Standing balance comment: Reliant on BUE support                              Pertinent Vitals/Pain Pain Assessment: No/denies pain    Home Living Family/patient expects to be discharged to:: Private residence Living Arrangements: Alone Available Help at Discharge: Family;Available PRN/intermittently Type of Home: House Home Access: Stairs to enter Entrance Stairs-Rails: Right Entrance Stairs-Number of Steps: 8 Home Layout: One level Home Equipment: Shower seat;Bedside commode;Hospital bed(Simultaneous filing. User may not have seen previous data.) Additional Comments: Has power wheelchiar but battery is dead    Prior Function Level of Independence: Independent               Hand Dominance   Dominant Hand: Right    Extremity/Trunk Assessment   Upper Extremity Assessment Upper Extremity Assessment: Defer to OT evaluation    Lower Extremity Assessment Lower Extremity Assessment: Generalized weakness    Cervical / Trunk Assessment Cervical / Trunk Assessment: Kyphotic  Communication   Communication: No difficulties  Cognition Arousal/Alertness: Awake/alert Behavior During  Therapy: WFL for tasks assessed/performed Overall Cognitive Status: Within Functional Limits for tasks assessed                                 General Comments: Pt getting slightly agitated for asking if he was lightheaded/dizzy, and stated "you  already asked me that." Explained why I asked pt a second time upon standing.       General Comments General comments (skin integrity, edema, etc.): Pt's daughter present throughout session. Very concerned about pt returning home alone.     Exercises General Exercises - Lower Extremity Ankle Circles/Pumps: AROM;Both;10 reps   Assessment/Plan    PT Assessment Patient needs continued PT services  PT Problem List Decreased strength;Decreased balance;Decreased mobility;Decreased cognition;Decreased knowledge of use of DME;Decreased knowledge of precautions       PT Treatment Interventions DME instruction;Gait training;Stair training;Functional mobility training;Therapeutic activities;Therapeutic exercise;Balance training;Patient/family education;Cognitive remediation    PT Goals (Current goals can be found in the Care Plan section)  Acute Rehab PT Goals Patient Stated Goal: to go to rehab to get stronger before going home per pt and pt's daughter.  PT Goal Formulation: With patient/family Time For Goal Achievement: 05/19/18 Potential to Achieve Goals: Good    Frequency Min 2X/week   Barriers to discharge Decreased caregiver support      Co-evaluation               AM-PAC PT "6 Clicks" Mobility  Outcome Measure Help needed turning from your back to your side while in a flat bed without using bedrails?: A Little Help needed moving from lying on your back to sitting on the side of a flat bed without using bedrails?: A Little Help needed moving to and from a bed to a chair (including a wheelchair)?: A Little Help needed standing up from a chair using your arms (e.g., wheelchair or bedside chair)?: A Little Help needed to walk in hospital room?: A Little Help needed climbing 3-5 steps with a railing? : A Lot 6 Click Score: 17    End of Session Equipment Utilized During Treatment: Gait belt Activity Tolerance: Patient tolerated treatment well Patient left: in chair;with  call bell/phone within reach;with chair alarm set Nurse Communication: Mobility status PT Visit Diagnosis: Unsteadiness on feet (R26.81);Other abnormalities of gait and mobility (R26.89);Muscle weakness (generalized) (M62.81)    Time: 3545-6256 PT Time Calculation (min) (ACUTE ONLY): 33 min   Charges:   PT Evaluation $PT Eval Low Complexity: 1 Low PT Treatments $Gait Training: 8-22 mins        Leighton Ruff, PT, DPT  Acute Rehabilitation Services  Pager: [phone] Office: [phone]   Rudean Hitt 05/05/2018, 2:05 PM

## 2018-05-05 NOTE — Progress Notes (Signed)
[facility]  Patient Status: Active (receiving services up to time of hospitalization)  [facility] is providing the following services: RN, PT, MSW and HHA  If patient discharges after hours, please call [phone].   [name] 05/05/2018, 9:59 AM

## 2018-05-06 LAB — URINALYSIS, ROUTINE W REFLEX MICROSCOPIC
Bilirubin Urine: NEGATIVE
Glucose, UA: NEGATIVE mg/dL
HGB URINE DIPSTICK: NEGATIVE
Ketones, ur: NEGATIVE mg/dL
Nitrite: NEGATIVE
PH: 5 (ref 5.0–8.0)
Protein, ur: NEGATIVE mg/dL
Specific Gravity, Urine: 1.009 (ref 1.005–1.030)

## 2018-05-06 LAB — BASIC METABOLIC PANEL
Anion gap: 8 (ref 5–15)
BUN: 23 mg/dL (ref 8–23)
CO2: 21 mmol/L — AB (ref 22–32)
Calcium: 8.8 mg/dL — ABNORMAL LOW (ref 8.9–10.3)
Chloride: 111 mmol/L (ref 98–111)
Creatinine, Ser: 1.77 mg/dL — ABNORMAL HIGH (ref 0.61–1.24)
GFR calc Af Amer: 37 mL/min — ABNORMAL LOW (ref >60)
GFR calc non Af Amer: 32 mL/min — ABNORMAL LOW (ref >60)
Glucose, Bld: 100 mg/dL — ABNORMAL HIGH (ref 70–99)
Potassium: 4.2 mmol/L (ref 3.5–5.1)
Sodium: 140 mmol/L (ref 135–145)

## 2018-05-06 LAB — HEMOGLOBIN AND HEMATOCRIT, BLOOD
HCT: 29.8 % — ABNORMAL LOW (ref 39.0–52.0)
Hemoglobin: 8.9 g/dL — ABNORMAL LOW (ref 13.0–17.0)

## 2018-05-06 LAB — CREATININE, URINE, RANDOM: Creatinine, Urine: 57.35 mg/dL

## 2018-05-06 LAB — SODIUM, URINE, RANDOM: Sodium, Ur: 65 mmol/L

## 2018-05-06 MED ORDER — HYDROCOD POLST-CPM POLST ER 10-8 MG/5ML PO SUER
5.0000 mL | Freq: Once | ORAL | Status: AC
  Administered 2018-05-06: 5 mL via ORAL
  Filled 2018-05-06: qty 5

## 2018-05-06 NOTE — Clinical Social Work Note (Signed)
Clinical Social Work Assessment  Patient Details  Name: [name] MRN: [id number] Date of Birth: [birth year]  Date of referral:  05/06/18               Reason for consult:  Facility Placement                Permission sought to share information with:  Facility Sport and exercise psychologist, Family Supports Permission granted to share information::  Yes, Verbal Permission Granted  Name::     Dianna  Agency::  [facility]NF[facility]  Relationship::  Daughter  Contact Information:  [phone]  Housing/Transportation Living arrangements for the past 2 months:  [place] of Information:  Patient, Adult Children, Other (Comment Required)(Niece) Patient Interpreter Needed:  None Criminal Activity/Legal Involvement Pertinent to Current Situation/Hospitalization:  No - Comment as needed Significant Relationships:  Adult Children, Other Family Members Lives with:  Other (Comment) Do you feel safe going back to the place where you live?  No Need for family participation in patient care:  Yes (Comment)  Care giving concerns:  CSW received consult for possible SNF placement at time of discharge. CSW spoke with patient, daughter, and niece at bedside regarding PT recommendation of SNF placement at time of discharge. Patient's daughter reported that patient's niece is currently unable to care for patient at their home given patient's current physical needs and fall risk. Patient's daughter lives in [place]. Patient expressed understanding of PT recommendation and is agreeable to SNF placement at time of discharge. CSW to continue to follow and assist with discharge planning needs.   Social Worker assessment / plan:  CSW spoke with patient and family concerning possibility of rehab at [facility] before returning home.  Employment status:  Retired Nurse, adult PT Recommendations:  [place] / Referral to community resources:  [place]  Patient/Family's Response to care:  Patient recognizes need for rehab before returning home and is agreeable to a SNF. Patient's daughter reported preference for [facility] since they have a family member there already (no beds at [facility] and didn't like other choices). [facility] to review and start insurance authorization if able.  Patient/Family's Understanding of and Emotional Response to Diagnosis, Current Treatment, and Prognosis:  Patient/family is realistic regarding therapy needs and expressed being hopeful for SNF placement. Patient expressed understanding of CSW role and discharge process as well as medical condition. No questions/concerns about plan or treatment.    Emotional Assessment Appearance:  Appears stated age Attitude/Demeanor/Rapport:  Engaged Affect (typically observed):  Accepting, Appropriate Orientation:  Oriented to Self, Oriented to Place, Oriented to  Time, Oriented to Situation Alcohol / Substance use:  Not Applicable Psych involvement (Current and /or in the community):  No (Comment)  Discharge Needs  Concerns to be addressed:  Care Coordination Readmission within the last 30 days:  Yes Current discharge risk:  None Barriers to Discharge:  Continued Medical Work up   [employer], LCSW 05/06/2018, 3:50 PM

## 2018-05-06 NOTE — Progress Notes (Signed)
PROGRESS NOTE    [name]  EVO:[id number] DOB: [birth year] DOA: 05/02/2018 PCP: [name], MD   Brief Narrative:  This is a 84 y/o male who a h/o of prostate cancer, recently diagnosed with lung mets. He he was d/c from [facility] last Monday. He has been doing, eating and drinking ok. Today it was noted that the patients blood pressure was soft. His SBP was in the 90's. Later his SBP dropped furthur to 60/40, 911 was called and he was sent to the ER. There is no report of fevers or chills, no nausea or vomiting. He denies any pain. He states his UOP is unchanged, no decrease in his UOP. He is not light headed or dizzy. He denies any BOU. Per his daughter he has been having liquid only stools since Tuesday. Clear watery diarrhea, no evidence of bleeding. Patient occult negative in ER,  In the ER his creatine was noted to have increased from 1.6 to 3.27. Request for admission was made. The family requested transfer to [place].  History provided by the patient and family members present at bedside.   Assessment & Plan:   Principal Problem:   Acute kidney injury superimposed on chronic kidney disease ([place]) Active Problems:   Hypothyroidism   HTN (hypertension)   Seizure disorder (HCC)   Hypokalemia   Dementia (HCC)   Prostate cancer ([place])   Lung metastases ([place])   Acute-on-chronic kidney injury ([place])   Pressure injury of skin   Hypotension  #1 acute renal failure on chronic kidney disease stage III Likely secondary to prerenal azotemia as patient noted to be hypotensive on presentation in the setting of diuretics and ARB.  Patient also noted to have had some loose stools prior to admission per admitting physician however family denies any significant loose stools.  Renal function improving with hydration.  Creatinine currently at 1.77 from 1.84 from 2.21 from 3.27 on admission.  Renal function close to baseline.  Saline lock IV fluids.  Diuretics and ARB on hold.  Will not  resume ARB on discharge.   2.  Hypotension Likely secondary to hypovolemia.  Blood pressure improved with hydration.  Afebrile.  EKG with no ischemic changes.  Urinalysis is nitrite negative leukocytes negative.  Chest x-ray negative for any acute infiltrate.  Patient with no diarrhea during his hospitalization.  Saline lock IV fluids.  Monitor closely on antihypertensive medications.  Follow.   3.  Diarrhea Questionable etiology.  Patient with no loose stools during this hospitalization.  Family questioning as to whether patient did have loose stools.  Patient with some mucus in stool.  DC C. difficile PCR and GI pathogen panel.  Enteric precautions have been discontinued.  Supportive care.   4.  Hypothyroidism TSH on 04/24/2018 was 8.377.  Continue Synthroid.  Repeat thyroid function studies in 4 to 6 weeks in the outpatient setting.   5.  Hypertension Blood pressure improved with hydration.  Patient was unable to get good IV access for most of the day on 05/03/2018.  Patient now with IV access.  Continue home regimen Norvasc and clonidine.  6.  Lactic acidosis Likely secondary to dehydration.  Urinalysis unremarkable.  Chest x-ray negative for any acute infiltrate.  Patient currently afebrile.  Repeat lactic acid level at 1.4.  Saline lock IV fluids.  Follow.     7.  History of metastatic prostate cancer/malignant pleural effusion Outpatient follow-up with Dr. Alinda Money of urology post discharge.   8.  Dementia Continue Namenda.  9.  Seizures Patient with no signs of seizures.  Decreased Keppra dose to 500 mg twice daily due to renal function.  Pharmacy following.   10.  Hypokalemia Repleted.  Potassium at 4.2.    11.  Stage I pressure injury of the sacrum Continue current wound care.  12.  Anemia of chronic disease/folate deficiency Hemoglobin currently at 8.9 from 9.1 from 9.5 from 8.1 from 9.1 on admission.  Likely dilutional.  Patient with no overt bleeding.  Anemia panel  consistent with anemia of chronic disease.  Folate levels decreased at 5.4.  Continue folic acid 1 mg daily.  Follow H&H.  13.  Dysuria Patient denies any further dysuria.  Patient afebrile.  Urinalysis with moderate leukocytes nitrite -6-10 WBCs.  Urine cultures pending.  If urine cultures are positive will treat empirically with antibiotics.      DVT prophylaxis: Heparin Code Status: Full Family Communication: Updated patient and daughter at bedside. Disposition Plan: SNF when bed available.   Consultants:   None  Procedures:   Chest x-ray 05/02/2018    Antimicrobials:   None   Subjective: Patient sitting up in chair.  Denies any chest pain or shortness of breath.  Denies any new further pain with urination.  No loose stools.   Objective: Vitals:   05/05/18 1726 05/05/18 2052 05/06/18 0344 05/06/18 0917  BP: (!) 151/65 140/75 109/61 (!) 148/73  Pulse: (!) 49 (!) 54 (!) 49 71  Resp: 18 20 20 18   Temp: 98.4 F (36.9 C) 98.4 F (36.9 C) 98.2 F (36.8 C) 98.4 F (36.9 C)  TempSrc: Oral Oral Oral Oral  SpO2: 100% 100% 100% 100%  Weight:      Height:        Intake/Output Summary (Last 24 hours) at 05/06/2018 1441 Last data filed at 05/06/2018 0936 Gross per 24 hour  Intake 2276.69 ml  Output 1300 ml  Net 976.69 ml   Filed Weights   05/02/18 1522 05/04/18 2145  Weight: 68 kg 68.1 kg    Examination:  General exam: Sitting up in chair.  NAD. Respiratory system: Clear to auscultation bilaterally.  No wheezes, no crackles, no rhonchi.  Cardiovascular system: RRR no murmurs rubs or gallops.  No JVD.  No lower extremity edema.  Gastrointestinal system: Abdomen is soft, nontender, nondistended, positive bowel sounds.  No rebound.  No guarding.  Central nervous system: Alert and oriented. No focal neurological deficits. Extremities: Symmetric 5 x 5 power. Skin: No rashes, lesions or ulcers Psychiatry: Judgement and insight appear normal. Mood & affect  appropriate.     Data Reviewed: I have personally reviewed following labs and imaging studies  CBC: Recent Labs  Lab 05/02/18 1612 05/02/18 2242 05/03/18 0558 05/04/18 0407 05/04/18 0941 05/05/18 0834 05/06/18 0538  WBC 5.9 6.3 6.0 4.6  --  6.3  --   NEUTROABS  --   --   --  2.0  --  2.8  --   HGB 9.1* 9.6* 10.6* 8.1* 9.5* 9.1* 8.9*  HCT 29.3* 32.0* 34.2* 26.6* 30.7* 30.1* 29.8*  MCV 93.0 90.7 89.3 89.0  --  90.9  --   PLT 160 173 150 153  --  144*  --    Basic Metabolic Panel: Recent Labs  Lab 05/02/18 1612 05/02/18 2242 05/03/18 0558 05/04/18 0407 05/05/18 0834 05/06/18 0538  NA 134*  --  139 137 139 140  K 3.2*  --  3.5 3.5 3.8 4.2  CL 103  --  105 107 111  111  CO2 21*  --  20* 21* 22 21*  GLUCOSE 98  --  86 106* 117* 100*  BUN 57*  --  47* 39* 30* 23  CREATININE 3.27* 2.87* 2.66* 2.21* 1.84* 1.77*  CALCIUM 9.1  --  9.3 8.6* 8.9 8.8*  MG  --   --  2.2  --   --   --    GFR: Estimated Creatinine Clearance: 24 mL/min (A) (by C-G formula based on SCr of 1.77 mg/dL (H)). Liver Function Tests: Recent Labs  Lab 05/02/18 1612  AST 18  ALT 14  ALKPHOS 82  BILITOT 0.5  PROT 7.2  ALBUMIN 3.8   No results for input(s): LIPASE, AMYLASE in the last 168 hours. No results for input(s): AMMONIA in the last 168 hours. Coagulation Profile: Recent Labs  Lab 05/03/18 0558  INR 1.16   Cardiac Enzymes: No results for input(s): CKTOTAL, CKMB, CKMBINDEX, TROPONINI in the last 168 hours. BNP (last 3 results) No results for input(s): PROBNP in the last 8760 hours. HbA1C: No results for input(s): HGBA1C in the last 72 hours. CBG: No results for input(s): GLUCAP in the last 168 hours. Lipid Profile: No results for input(s): CHOL, HDL, LDLCALC, TRIG, CHOLHDL, LDLDIRECT in the last 72 hours. Thyroid Function Tests: No results for input(s): TSH, T4TOTAL, FREET4, T3FREE, THYROIDAB in the last 72 hours. Anemia Panel: Recent Labs    05/04/18 0941 05/04/18 0952    VITAMINB12 3,083*  --   FOLATE  --  5.4*  FERRITIN 227  --   TIBC 223*  --   IRON 91  --   RETICCTPCT 2.6  --    Sepsis Labs: Recent Labs  Lab 05/02/18 1732 05/04/18 0407  LATICACIDVEN 2.43* 1.4    No results found for this or any previous visit (from the past 240 hour(s)).       Radiology Studies: No results found.      Scheduled Meds: . amLODipine  10 mg Oral Daily  . aspirin EC  81 mg Oral Daily  . cloNIDine  0.1 mg Oral BID  . clopidogrel  75 mg Oral Daily  . folic acid  1 mg Oral Daily  . heparin  5,000 Units Subcutaneous Q8H  . levETIRAcetam  500 mg Oral BID  . levothyroxine  112 mcg Oral Q24H  . memantine  10 mg Oral BID  . pantoprazole  40 mg Oral Daily   Continuous Infusions:    LOS: 4 days    Time spent: 35 minutes    Irine Seal, MD [facility] Pager [phone]  If 7PM-7AM, please contact night-coverage [email or web address] Password [email or web address] 05/06/2018, 2:41 PM

## 2018-05-07 LAB — BASIC METABOLIC PANEL
Anion gap: 7 (ref 5–15)
BUN: 20 mg/dL (ref 8–23)
CO2: 23 mmol/L (ref 22–32)
CREATININE: 1.56 mg/dL — AB (ref 0.61–1.24)
Calcium: 9 mg/dL (ref 8.9–10.3)
Chloride: 109 mmol/L (ref 98–111)
GFR calc Af Amer: 43 mL/min — ABNORMAL LOW (ref >60)
GFR calc non Af Amer: 37 mL/min — ABNORMAL LOW (ref >60)
Glucose, Bld: 105 mg/dL — ABNORMAL HIGH (ref 70–99)
Potassium: 4.1 mmol/L (ref 3.5–5.1)
Sodium: 139 mmol/L (ref 135–145)

## 2018-05-07 LAB — URINE CULTURE

## 2018-05-07 LAB — HEMOGLOBIN AND HEMATOCRIT, BLOOD
HCT: 26.8 % — ABNORMAL LOW (ref 39.0–52.0)
Hemoglobin: 8.4 g/dL — ABNORMAL LOW (ref 13.0–17.0)

## 2018-05-07 NOTE — Progress Notes (Signed)
Per [facility], [place] has denied SNF admission. MD to complete peer to peer. CSW also provided family appeal number to daughter.  Percell Locus Gilford Lardizabal LCSW [phone]

## 2018-05-07 NOTE — Progress Notes (Signed)
[facility] still awaiting insurance authorization.   Percell Locus Kamiah Fite LCSW [phone]

## 2018-05-07 NOTE — Care Management Important Message (Signed)
Important Message  Patient Details  Name: KENDARIOUS GUDINO MRN: [id number] Date of Birth: 1920/09/08   Medicare Important Message Given:  Yes    Jaziah Goeller 05/07/2018, 1:52 PM

## 2018-05-07 NOTE — Progress Notes (Signed)
[facility] PROGRESS NOTE  [name] VFI:[id number] DOB: 10/02/1920 DOA: 05/02/2018 PCP: [name], MD   Narrative: 84 year old male History of TURP 1992 and XRT to prostate with follow-up with Dr. Hart Rochester in the outpatient setting TIA 09/17/6061 Chronic diastolic heart failure EF 60-65% Hypothyroidism Seizure disorder HTN Probable dementia Esophageal stricture GE junction with Candida esophagitis treated in the past Recent admission 11/20-04/2518 with lower extremity bilateral edema found to be slightly short of breath with a left pleural effusion-this turned out to be malignant left pleural effusion consistent with adenocarcinoma CKD 3 as well  Readmitted any pain 05/02/2018 with low blood pressures liquid stools clear watery diarrhea and bump in creatinine from 1.6-3.2 Diarrhea neg for C. difficile   A & Plan Acute Kidney injury superimposed on chronic kidney disease stage III-resolved-needs to avoid NAID and toxins-rpt labs periodically Hypertension-continue amlodipine 10 and clonidine 0.1  Bid-mod controlled Diarrhea C. difficile negative Hypothyroid-cont synthroid 112 mcg-adjustined meds earlier this admit Hypotension now resolved Metastatic prostate cancer with malignant pleural effusion-stable atthis time-no other signs or sym of sob Probable dementia-very mild-orients well-cont namenda 10 bid Seizure disorder-cont Keppra 500 qds Stage I pressure ulcer sacrum Anemia of chronic disease Dysuria Mild malnutrition-Body mass index is 22.9 kg/m.  Debility-patient unable to get up independently recommends SNF-medically stable for discharge   DVT prophylaxis: lovenox  Code Status: full   Family Communication: inpatient   Disposition Plan: unclear-Peer-toperr scheudled    Verlon Au, MD  [facility] Direct contact: [phone] --Via amion app OR  --[email or web address]; password [email or web address]  7PM-7AM contact night coverage as above 05/07/2018, 7:54 AM  LOS: 5 days    Consultants:  n  Procedures:  n  Antimicrobials:  n  Interval history/Subjective: Weak.cannot sit up in the bed Oriented, but famiyl asking when therapy will work with him No fever no chills Usual baseline-drives, cook and cleans-still active church member-nowhere near baseline per daughter in room  Objective:  Vitals:  Vitals:   05/06/18 1700 05/07/18 0424  BP: 138/66 136/65  Pulse: 72 (!) 51  Resp: 18 18  Temp: 98.2 F (36.8 C) 98.2 F (36.8 C)  SpO2: 98% 99%    Exam:  Awake alert oreineted eomi arcus sinilis cta b  abd soft nt nd no rebound no guard Neuro intact-power 5/5, smile symm, reflexes 2/3   I have personally reviewed the following:   Labs:  BUN/creatinine down from 23/1.7 to 20/1.5  Hemoglobin 8.4     Scheduled Meds: . amLODipine  10 mg Oral Daily  . aspirin EC  81 mg Oral Daily  . cloNIDine  0.1 mg Oral BID  . clopidogrel  75 mg Oral Daily  . folic acid  1 mg Oral Daily  . heparin  5,000 Units Subcutaneous Q8H  . levETIRAcetam  500 mg Oral BID  . levothyroxine  112 mcg Oral Q24H  . memantine  10 mg Oral BID  . pantoprazole  40 mg Oral Daily   Continuous Infusions:  Principal Problem:   Acute kidney injury superimposed on chronic kidney disease (HCC) Active Problems:   Hypothyroidism   HTN (hypertension)   Seizure disorder (HCC)   Hypokalemia   Dementia (HCC)   Prostate cancer ([place])   Lung metastases ([place])   Acute-on-chronic kidney injury ([place])   Pressure injury of skin   Hypotension   LOS: 5 days

## 2018-05-08 NOTE — Progress Notes (Signed)
[facility] PROGRESS NOTE  [name] [id number] DOB: 1920/07/24 DOA: 05/02/2018 PCP: [name], MD   Narrative: 84 year old male History of TURP 1992 and XRT to prostate with follow-up with Dr. Hart Rochester in the outpatient setting TIA 1/63/8466 Chronic diastolic heart failure EF 60-65% Hypothyroidism Mild dementia Seizure disorder HTN mild dementia Esophageal stricture GE junction with Candida esophagitis treated in the past Recent admission 11/20-04/2518 with lower extremity bilateral edema found to be slightly short of breath with a left pleural effusion-this turned out to be malignant left pleural effusion consistent with adenocarcinoma CKD 3 as well  Readmitted any pain 05/02/2018 with low blood pressures liquid stools clear watery diarrhea and bump in creatinine from 1.6-3.2 Diarrhea neg for C. difficile   A & Plan Acute Kidney injury superimposed on chronic kidney disease stage III-resolved-needs to avoid NAID and toxins-rpt labs periodically Hypertension-continue amlodipine 10 and clonidine 0.1  Bid-mod controlled Diarrhea C. difficile negative Hypothyroid-cont synthroid 112 mcg-adjustined meds earlier this admit Hypotension now resolved Metastatic prostate cancer with malignant pleural effusion-stable at this time-no other signs or sym of sob Probable dementia-very mild-orients well-cont namenda 10 bid Seizure disorder-cont Keppra 500 qds Stage I pressure ulcer sacrum Anemia of chronic disease Dysuria Mild malnutrition-Body mass index is 22.9 kg/m.  Debility-patient unable to get up independently--dispose pending as patient has still having ambulation and independent movement as per prior   DVT prophylaxis: lovenox  Code Status: full   Family Communication: inpatient   Disposition Plan: unclear-Peer-toperr scheudled    Verlon Au, MD  [facility] Direct contact: [phone] --Via amion app OR  --[email or web address]; password [email or web address]  7PM-7AM  contact night coverage as above 05/08/2018, 2:11 PM  LOS: 6 days   Consultants:  n  Procedures:  n  Antimicrobials:  n  Interval history/Subjective:  Overall weak needed assistance getting out of bed No fever no chills No nausea no vomiting  Objective:  Vitals:  Vitals:   05/08/18 0631 05/08/18 0833  BP: 135/69 (!) 141/74  Pulse: 60 74  Resp: 18 19  Temp: 98.2 F (36.8 C)   SpO2: 98% 97%    Exam:  Exam unchanged from prior  Awake alert oreineted eomi arcus sinilis cta b  abd soft nt nd no rebound no guard Neuro intact-power 5/5, smile symm, reflexes 2/3   I have personally reviewed the following:   Labs:  BUN/creatinine down from 23/1.7 to 20/1.5  Hemoglobin 8.4     Scheduled Meds: . amLODipine  10 mg Oral Daily  . aspirin EC  81 mg Oral Daily  . cloNIDine  0.1 mg Oral BID  . clopidogrel  75 mg Oral Daily  . folic acid  1 mg Oral Daily  . heparin  5,000 Units Subcutaneous Q8H  . levETIRAcetam  500 mg Oral BID  . levothyroxine  112 mcg Oral Q24H  . memantine  10 mg Oral BID  . pantoprazole  40 mg Oral Daily   Continuous Infusions:  Principal Problem:   Acute kidney injury superimposed on chronic kidney disease (HCC) Active Problems:   Hypothyroidism   HTN (hypertension)   Seizure disorder (HCC)   Hypokalemia   Dementia (HCC)   Prostate cancer ([place])   Lung metastases ([place])   Acute-on-chronic kidney injury ([place])   Pressure injury of skin   Hypotension   LOS: 6 days

## 2018-05-08 NOTE — Progress Notes (Signed)
Physical Therapy Treatment Patient Details Name: [name] MRN: [id number] DOB: 1921-04-28 Today's Date: 05/08/2018    History of Present Illness Pt is a 84 y/o male admitted secondary to low BP. Also found to ave AKI superimposed on CKD. PMH includes HTN, prostate cancer, dementia, CVA, and CKD.     PT Comments    Pt with slow progression towards goals. Attempted mobility tasks without AD this session, as pt was previously independent with gait prior to admission. Pt very slow to move and requiring increased assist without AD. Required mod A and held onto PT's arms during ambulation this session. Reviewed supine HEP with pt as well. Per pt's daughter, pt is no where close to his baseline mobility. Feel pt will require SNF level therapies at d/c to increase independence and safety with functional mobility tasks prior to return home. Will continue to follow acutely.   Follow Up Recommendations  SNF;Supervision for mobility/OOB     Equipment Recommendations  None recommended by PT    Recommendations for Other Services       Precautions / Restrictions Precautions Precautions: Fall Restrictions Weight Bearing Restrictions: No    Mobility  Bed Mobility Overal bed mobility: Needs Assistance Bed Mobility: Supine to Sit     Supine to sit: Mod assist     General bed mobility comments: Mod A for LE assist and trunk elevation. Pt moving slower and requiring more assist than previous session.   Transfers Overall transfer level: Needs assistance Equipment used: 1 person hand held assist Transfers: Sit to/from Stand Sit to Stand: Mod assist         General transfer comment: Attempted to stand without AD, since pt not requiring AD prior to admission. Pt requiring mod A for lift assist and steadying. PT stood in front of pt and had pt holding to PT arms.   Ambulation/Gait Ambulation/Gait assistance: Mod assist Gait Distance (Feet): 20 Feet Assistive device: None Gait  Pattern/deviations: Step-through pattern;Decreased stride length;Trunk flexed Gait velocity: Decreased  Gait velocity interpretation: <1.31 ft/sec, indicative of household ambulator General Gait Details: Pt holding to PT arms throughout ambulation this session, as we performed gait without AD. Pt very unsteady and requiring mod A for steadying assist throughout. Also required cues for sequencing and had posterior lean at times. Pt unaware of deficits and that he required increased assist. Pt's daughter reports pt no where close to baseline ambulation   Stairs             Wheelchair Mobility    Modified Rankin (Stroke Patients Only)       Balance Overall balance assessment: Needs assistance Sitting-balance support: No upper extremity supported;Feet supported Sitting balance-Leahy Scale: Fair     Standing balance support: Bilateral upper extremity supported;During functional activity Standing balance-Leahy Scale: Poor Standing balance comment: Reliant on BUE and external support.                             Cognition Arousal/Alertness: Awake/alert Behavior During Therapy: WFL for tasks assessed/performed Overall Cognitive Status: History of cognitive impairments - at baseline                                        Exercises General Exercises - Lower Extremity Ankle Circles/Pumps: AROM;Both;20 reps Heel Slides: AROM;Both;10 reps(cues for technique )    General Comments General comments (skin  integrity, edema, etc.): Pt's daughter present during session.       Pertinent Vitals/Pain Pain Assessment: No/denies pain    Home Living                      Prior Function            PT Goals (current goals can now be found in the care plan section) Acute Rehab PT Goals Patient Stated Goal: to go to rehab to get stronger before going home per pt and pt's daughter.  PT Goal Formulation: With patient/family Time For Goal Achievement:  05/19/18 Potential to Achieve Goals: Good Progress towards PT goals: Progressing toward goals    Frequency    Min 2X/week      PT Plan Current plan remains appropriate    Co-evaluation              AM-PAC PT "6 Clicks" Mobility   Outcome Measure  Help needed turning from your back to your side while in a flat bed without using bedrails?: A Lot Help needed moving from lying on your back to sitting on the side of a flat bed without using bedrails?: A Lot Help needed moving to and from a bed to a chair (including a wheelchair)?: A Lot Help needed standing up from a chair using your arms (e.g., wheelchair or bedside chair)?: A Lot Help needed to walk in hospital room?: A Lot Help needed climbing 3-5 steps with a railing? : A Lot 6 Click Score: 12    End of Session Equipment Utilized During Treatment: Gait belt Activity Tolerance: Patient tolerated treatment well Patient left: in chair;with call bell/phone within reach;with chair alarm set Nurse Communication: Mobility status PT Visit Diagnosis: Unsteadiness on feet (R26.81);Other abnormalities of gait and mobility (R26.89);Muscle weakness (generalized) (M62.81)     Time: 7124-5809 PT Time Calculation (min) (ACUTE ONLY): 30 min  Charges:  $Gait Training: 8-22 mins $Therapeutic Activity: 8-22 mins                     Leighton Ruff, PT, DPT  Acute Rehabilitation Services  Pager: [phone] Office: [phone]    Rudean Hitt 05/08/2018, 11:16 AM

## 2018-05-08 NOTE — Progress Notes (Signed)
Still awaiting appeal decision. Family updated.  Percell Locus Rahiem Schellinger LCSW [phone]

## 2018-05-09 LAB — CBC WITH DIFFERENTIAL/PLATELET
Abs Immature Granulocytes: 0.03 10*3/uL (ref 0.00–0.07)
BASOS PCT: 1 %
Basophils Absolute: 0.1 10*3/uL (ref 0.0–0.1)
Eosinophils Absolute: 0.6 10*3/uL — ABNORMAL HIGH (ref 0.0–0.5)
Eosinophils Relative: 6 %
HCT: 32.1 % — ABNORMAL LOW (ref 39.0–52.0)
Hemoglobin: 9.8 g/dL — ABNORMAL LOW (ref 13.0–17.0)
Immature Granulocytes: 0 %
Lymphocytes Relative: 20 %
Lymphs Abs: 2 10*3/uL (ref 0.7–4.0)
MCH: 27.5 pg (ref 26.0–34.0)
MCHC: 30.5 g/dL (ref 30.0–36.0)
MCV: 89.9 fL (ref 80.0–100.0)
MONOS PCT: 8 %
Monocytes Absolute: 0.8 10*3/uL (ref 0.1–1.0)
NEUTROS ABS: 6.3 10*3/uL (ref 1.7–7.7)
Neutrophils Relative %: 65 %
PLATELETS: 159 10*3/uL (ref 150–400)
RBC: 3.57 MIL/uL — ABNORMAL LOW (ref 4.22–5.81)
RDW: 13.8 % (ref 11.5–15.5)
WBC: 9.8 10*3/uL (ref 4.0–10.5)
nRBC: 0 % (ref 0.0–0.2)

## 2018-05-09 LAB — BASIC METABOLIC PANEL
Anion gap: 8 (ref 5–15)
BUN: 25 mg/dL — ABNORMAL HIGH (ref 8–23)
CO2: 26 mmol/L (ref 22–32)
Calcium: 9.3 mg/dL (ref 8.9–10.3)
Chloride: 105 mmol/L (ref 98–111)
Creatinine, Ser: 1.75 mg/dL — ABNORMAL HIGH (ref 0.61–1.24)
GFR calc Af Amer: 38 mL/min — ABNORMAL LOW (ref >60)
GFR calc non Af Amer: 32 mL/min — ABNORMAL LOW (ref >60)
Glucose, Bld: 111 mg/dL — ABNORMAL HIGH (ref 70–99)
Potassium: 4.3 mmol/L (ref 3.5–5.1)
Sodium: 139 mmol/L (ref 135–145)

## 2018-05-09 NOTE — Progress Notes (Signed)
Still no word from [place]. Patient's daughter would like to appeal the hospital discharge. CSW explained that she could do so once the MD has placed the DC order. Daughter aware and has the number to appeal.   Cedric Fishman LCSW [phone]

## 2018-05-09 NOTE — Progress Notes (Signed)
Physical Therapy Treatment Patient Details Name: [name] MRN: [id number] DOB: Jun 07, 1920 Today's Date: 05/09/2018    History of Present Illness Pt is a 84 y/o male admitted secondary to low BP. Also found to ave AKI superimposed on CKD. PMH includes HTN, prostate cancer, dementia, CVA, and CKD.     PT Comments    Patient progressing with ambulation with walker.  Remains significantly off balance with standing unsupported to complete any ADL with high fall risk.  Currently remains appropriate for SNF level rehab at d/c.    Follow Up Recommendations  SNF;Supervision for mobility/OOB     Equipment Recommendations  None recommended by PT    Recommendations for Other Services       Precautions / Restrictions Precautions Precautions: Fall    Mobility  Bed Mobility Overal bed mobility: Needs Assistance Bed Mobility: Sit to Supine;Rolling Rolling: Modified independent (Device/Increase time)     Sit to supine: Min guard   General bed mobility comments: cues and assist for positioning in supine, rolled for hygiene once in supine due to incontinent of small amount of liquid stool  Transfers Overall transfer level: Needs assistance Equipment used: Rolling walker (2 wheeled) Transfers: Sit to/from Stand Sit to Stand: Min guard         General transfer comment: assist for safety, increased time, slight posterior bias in standing  Ambulation/Gait   Gait Distance (Feet): 200 Feet Assistive device: Rolling walker (2 wheeled) Gait Pattern/deviations: Step-through pattern;Decreased stride length;Shuffle;Trunk flexed   Gait velocity interpretation: <1.31 ft/sec, indicative of household ambulator General Gait Details: steady with RW, but very slow with increased fall risk due to decreased gait velocity    Stairs             Wheelchair Mobility    Modified Rankin (Stroke Patients Only)       Balance Overall balance assessment: Needs  assistance Sitting-balance support: No upper extremity supported;Feet supported Sitting balance-Leahy Scale: Good   Postural control: Posterior lean Standing balance support: Bilateral upper extremity supported;During functional activity Standing balance-Leahy Scale: Poor Standing balance comment: leaning backward during attempts to pull up mesh brief with mod A for balance                             Cognition Arousal/Alertness: Awake/alert Behavior During Therapy: WFL for tasks assessed/performed Overall Cognitive Status: Within Functional Limits for tasks assessed                                        Exercises      General Comments General comments (skin integrity, edema, etc.): daughter in room during session      Pertinent Vitals/Pain Pain Assessment: No/denies pain    Home Living                      Prior Function            PT Goals (current goals can now be found in the care plan section) Progress towards PT goals: Progressing toward goals    Frequency    Min 2X/week      PT Plan Current plan remains appropriate    Co-evaluation              AM-PAC PT "6 Clicks" Mobility   Outcome Measure  Help needed turning from your back to  your side while in a flat bed without using bedrails?: A Little Help needed moving from lying on your back to sitting on the side of a flat bed without using bedrails?: A Little Help needed moving to and from a bed to a chair (including a wheelchair)?: A Little Help needed standing up from a chair using your arms (e.g., wheelchair or bedside chair)?: A Little Help needed to walk in hospital room?: A Little Help needed climbing 3-5 steps with a railing? : A Lot 6 Click Score: 17    End of Session Equipment Utilized During Treatment: Gait belt Activity Tolerance: Patient tolerated treatment well Patient left: in bed;with call bell/phone within reach;with family/visitor present    PT Visit Diagnosis: Unsteadiness on feet (R26.81);Other abnormalities of gait and mobility (R26.89);Muscle weakness (generalized) (M62.81)     Time: 4944-9675 PT Time Calculation (min) (ACUTE ONLY): 28 min  Charges:  $Gait Training: 8-22 mins $Therapeutic Activity: 8-22 mins                     [name], PT Acute Rehabilitation Services [phone] 05/09/2018    [name] 05/09/2018, 6:03 PM

## 2018-05-09 NOTE — Progress Notes (Signed)
[facility] H[facility]ITALIST PROGRESS NOTE  [name] UXL:[id number] DOB: 09/03/1920 DOA: 05/02/2018 PCP: [name], MD   Narrative: 84 year old male History of TURP 1992 and XRT to prostate with follow-up with Dr. Hart Rochester in the outpatient setting TIA 5/36/6440 Chronic diastolic heart failure EF 60-65% Hypothyroidism Mild dementia Seizure disorder HTN mild dementia Esophageal stricture GE junction with Candida esophagitis treated in the past Recent admission 11/20-04/2518 with lower extremity bilateral edema found to be slightly short of breath with a left pleural effusion-this turned out to be malignant left pleural effusion consistent with adenocarcinoma CKD 3 as well  Readmitted any pain 05/02/2018 with low blood pressures liquid stools clear watery diarrhea and bump in creatinine from 1.6-3.2 Diarrhea neg for C. difficile   A & Plan Acute Kidney injury superimposed on chronic kidney disease stage III-resolved-.  His essentially stable at this time for discharge but does need continued skilled care and help with mobilization Penile lesion-has an ulcer likely secondary to condom catheter-no work-up needed at this time not infected Hypertension-continue amlodipine 10 and clonidine 0.1  Bid-mod controlled-might need to increase medications will consider addition of beta-blocker in a.m. Diarrhea C. difficile negative Hypothyroid-cont synthroid 112 mcg-adjustined meds earlier this admit-needs TSH as an outpatient Hypotension now resolved Metastatic prostate cancer with malignant pleural effusion-stable at this time-no other signs or sym of sob Probable dementia-very mild-orients well-cont namenda 10 bid Seizure disorder-cont Keppra 500 qds Stage I pressure ulcer sacrum Anemia of chronic disease Dysuria Mild malnutrition-Body mass index is 22.9 kg/m.  Debility-patient unable to get up independently-patient pending decision for skilled nursing placement   DVT prophylaxis:  lovenox  Code Status: full   Family Communication: inpatient   Disposition Plan: unclear-Peer-toperr scheudled    Verlon Au, MD  [facility] Direct contact: [phone] --[place] [place]  --[email or web address]; password [email or web address]  7PM-7AM contact night coverage as above 05/09/2018, 4:08 PM  LOS: 7 days   Consultants:  n  Procedures:  n  Antimicrobials:  n  Interval history/Subjective:  Sitting up in bed no distress Objective:  Vitals:  Vitals:   05/09/18 0452 05/09/18 0935  BP: (!) 148/69 (!) 151/85  Pulse: (!) 53 83  Resp: 18 19  Temp: 98.4 F (36.9 C) (!) 97.4 F (36.3 C)  SpO2: 100% 95%    Exam:  Exam unchanged from prior  Awake alert oreineted eomi arcus sinilis cta b  abd soft nt nd no rebound no guard-tip of penis has denudation of the skin without any erythema or purulence Neuro intact-power 5/5, smile symm, reflexes 2/3   I have personally reviewed the following:   Labs:  BUN/creatinine down from 23/1.7 to 20/1.5-->25/1.7  Hemoglobin 8.4-->9.8   Scheduled Meds: . amLODipine  10 mg Oral Daily  . aspirin EC  81 mg Oral Daily  . cloNIDine  0.1 mg Oral BID  . clopidogrel  75 mg Oral Daily  . folic acid  1 mg Oral Daily  . heparin  5,000 Units Subcutaneous Q8H  . levETIRAcetam  500 mg Oral BID  . levothyroxine  112 mcg Oral Q24H  . memantine  10 mg Oral BID  . pantoprazole  40 mg Oral Daily   Continuous Infusions:  Principal Problem:   Acute kidney injury superimposed on chronic kidney disease ([place]) Active Problems:   Hypothyroidism   HTN (hypertension)   Seizure disorder (HCC)   Hypokalemia   Dementia (HCC)   Prostate cancer ([place])   Lung metastases ([place])   Acute-on-chronic kidney injury ([place])  Pressure injury of skin   Hypotension   LOS: 7 days

## 2018-05-09 NOTE — Progress Notes (Signed)
CSW spoke with patient's daughter, Peter Congo. She stated that [facility] is still waiting on the MD to call back and also needs additional clinicals. CSW explained that the MD has not heard from [place]. CSW spoke with [facility]-they will fax in the updates.   Percell Locus Dajah Fischman LCSW [phone]

## 2018-05-09 NOTE — Progress Notes (Addendum)
Occupational Therapy Treatment Patient Details Name: [name] MRN: [id number] DOB: [birth year] Today's Date: 05/09/2018    History of present illness Pt is a 84 y/o male admitted secondary to low BP. Also found to ave AKI superimposed on CKD. PMH includes HTN, prostate cancer, dementia, CVA, and CKD.    OT comments  Pt. Seen for skilled OT.  Pt. Able to complete bed mobility, LB dressing, and simulated toileting tasks.  Will benefit from continued therapy for increasing safety and independence with ADLs/mobility. Prior to home  Follow Up Recommendations  Supervision/Assistance - 24 hour;SNF    Equipment Recommendations       Recommendations for Other Services      Precautions / Restrictions Precautions Precautions: Fall       Mobility Bed Mobility Overal bed mobility: Needs Assistance Bed Mobility: Supine to Sit     Supine to sit: Supervision;HOB elevated     General bed mobility comments: increased time and effort with HOB elevated (pt. and family reports he has bed with hospital functions at home) exits on R side. able to bring b les off of bed and scoot hips to bring feet to the floor but took several min. And cues to complete the task  Transfers Overall transfer level: Needs assistance Equipment used: Rolling walker (2 wheeled) Transfers: Sit to/from [facility] Sit to Stand: Min guard/min a Stand pivot transfers: Min guard/min a       General transfer comment: noted improvement with sit/stand and in room mobility with use of RW. Pt. agreeable to this today but remains with postural deficits that have him amb. With head down. This increases fall risk. Encouraged and facilitated with postural cues.  Pt. With shuffled gait also.     Balance                                           ADL either performed or assessed with clinical judgement   ADL Overall ADL's : Needs assistance/impaired                     Lower  Body Dressing: Minimal assistance;Sit to/from stand Lower Body Dressing Details (indicate cue type and reason): sat eob and able to don socks.  suggested modifications but pt. insistent on leaning forward while seated. SOB noted after donning one sock, I assisted with the second sock. Toilet Transfer: Minimal assistance;RW;Ambulation Armed forces technical officer Details (indicate cue type and reason): min a sim. from eob, walked around the bed and sat in recliner. currently using urinal prn Toileting- Clothing Manipulation and Hygiene: Minimal assistance;Sit to/from stand       Functional mobility during ADLs: Rolling walker;Minimal assistance       Vision       Perception     Praxis      Cognition Arousal/Alertness: Awake/alert Behavior During Therapy: WFL for tasks assessed/performed Overall Cognitive Status: Within Functional Limits for tasks assessed                                          Exercises     Shoulder Instructions       General Comments      Pertinent Vitals/ Pain       Pain Assessment: No/denies pain  Home Living  Prior Functioning/Environment              Frequency  Min 2X/week        Progress Toward Goals  OT Goals(current goals can now be found in the care plan section)  Progress towards OT goals: Progressing toward goals     Plan      Co-evaluation                 AM-PAC OT "6 Clicks" Daily Activity     Outcome Measure   Help from another person eating meals?: None Help from another person taking care of personal grooming?: A Little Help from another person toileting, which includes using toliet, bedpan, or urinal?: A Little Help from another person bathing (including washing, rinsing, drying)?: A Little Help from another person to put on and taking off regular upper body clothing?: A Little Help from another person to put on and taking off regular lower  body clothing?: A Little 6 Click Score: 19    End of Session Equipment Utilized During Treatment: Gait belt;Rolling walker  OT Visit Diagnosis: Unsteadiness on feet (R26.81);Muscle weakness (generalized) (M62.81)   Activity Tolerance Patient tolerated treatment well   Patient Left in chair;with call bell/phone within reach;with nursing/sitter in room   Nurse Communication Other (comment)(reviewed with CNA pt. ready to freshen up)        Time: 0813-8871 OT Time Calculation (min): 22 min  Charges: OT General Charges $OT Visit: 1 Visit OT Treatments $Self Care/Home Management : 8-22 mins   Janice Coffin, COTA/L 05/09/2018, 10:03 AM

## 2018-05-10 ENCOUNTER — Encounter (HOSPITAL_COMMUNITY): Payer: Self-pay

## 2018-05-10 MED ORDER — ENSURE ENLIVE PO LIQD
237.0000 mL | ORAL | Status: DC
  Administered 2018-05-11 – 2018-05-12 (×2): 237 mL via ORAL

## 2018-05-10 MED ORDER — ENSURE ENLIVE PO LIQD: 237.0000 mL | ORAL | Status: DC

## 2018-05-10 MED ORDER — HYDROCORTISONE VALERATE 0.2 % EX CREA
TOPICAL_CREAM | Freq: Two times a day (BID) | CUTANEOUS | Status: DC
  Administered 2018-05-10 – 2018-05-13 (×7): via TOPICAL
  Filled 2018-05-10: qty 15

## 2018-05-10 NOTE — Progress Notes (Signed)
Received Documentation Request Form FAX from [facility] at 13:48  Case ID [id number] Faxed: Detailed Notice of DC IM dated 05/10/18 [id number] Facesheet Routed: All notes and orders  To [phone] as directed on Documentation Request form at 2:45

## 2018-05-10 NOTE — Progress Notes (Signed)
Nutrition Brief Note  Patient identified on the Malnutrition Screening Tool (MST) Report. This MST was completed at the 1 week of admission mark.  Wt Readings from Last 15 Encounters:  05/10/18 72.4 kg  04/24/18 72.6 kg  04/20/18 72.6 kg  05/18/14 83.9 kg  01/11/14 79.4 kg  12/29/12 84.8 kg  10/03/12 90.1 kg  09/29/12 86.8 kg  09/17/12 88.5 kg  03/05/12 89.4 kg  10/16/11 90.7 kg  09/25/11 90.7 kg   Body mass index is 22.9 kg/m. Patient meets criteria for healthy wt for ht based on current BMI.   Current diet order is Soft.   Per chart, pt goes through spells of eating 100% of meals and other spells where he eats 0% of meals. Suspect some of the 0% meals may be from when pt is in HD.  On RD arrival to room, several family members are present. They report pt has been eating "excellent" recently. Their only issue is that pt has not been able to receive pepper or cold ice tea due to diet restrictions. He is not interested in the salt. These restrictions arise from past practice of ordering soft diet for GI conditions where gut irritation was a concern. Will see if pt can be changed to Regular diet. Family will still be able to avoid tough to chew items by ordering meals.   RD also suggested ensure for increased kcals/protein. Pt agreed to 1 of these a day.   Pt eating well and appears to be maintaining weight. No further nutrition interventions warranted at this time. If nutrition issues arise, please consult RD.   Burtis Junes RD, LDN, CNSC Clinical Nutrition Available Tues-Sat via Pager: [phone] 05/10/2018 5:47 PM

## 2018-05-10 NOTE — Progress Notes (Signed)
[facility]ITALIST PROGRESS NOTE  [name] [id number]:[id number] DOB: 1920/11/18 DOA: 05/02/2018 PCP: [name], MD   Narrative: 84 year old male History of TURP 1992 and XRT to prostate with follow-up with Dr. Hart Rochester in the outpatient setting TIA 0/16/0109 Chronic diastolic heart failure EF 60-65% Hypothyroidism Mild dementia Seizure disorder HTN mild dementia Esophageal stricture GE junction with Candida esophagitis treated in the past Recent admission 11/20-04/2518 with lower extremity bilateral edema found to be slightly short of breath with a left pleural effusion-this turned out to be malignant left pleural effusion consistent with adenocarcinoma CKD 3 as well  Readmitted any pain 05/02/2018 with low blood pressures liquid stools clear watery diarrhea and bump in creatinine from 1.6-3.2 Diarrhea neg for C. difficile   A & Plan Acute Kidney injury superimposed on chronic kidney disease stage III-resolved-.  His essentially stable but does need continued skilled care and help with mobilization Penile lesion-has an ulcer likely secondary to condom catheter-no work-up needed at this time not infected--adding low dose Hydrocortisone cream 0.2 % ending on 12/14 Hypertension-continue amlodipine 10 and clonidine 0.1  Bid-mod controlled-might need to increase medications will consider addition of beta-blocker in a.m. Diarrhea C. difficile negative Hypothyroid-cont synthroid 112 mcg-adjustined meds earlier this admit-needs TSH as an outpatient Hypotension now resolved Metastatic prostate cancer with malignant pleural effusion-stable at this time-no other signs or sym of sob Probable dementia-very mild-orients well-cont namenda 10 bid Seizure disorder-cont Keppra 500 qds Stage I pressure ulcer sacrum Anemia of chronic disease Dysuria Mild malnutrition-Body mass index is 22.9 kg/m.  Debility-patient unable to get up independently-patient pending decision for skilled nursing  placement   DVT prophylaxis: lovenox  Code Status: full   Family Communication: inpatient   Disposition Plan: unclear-Peer-toperr scheudled    Verlon Au, MD  [facility] Direct contact: [phone] --Via [place]  --[email or web address]; password [email or web address]  7PM-7AM contact night coverage as above 05/10/2018, 10:52 AM  LOS: 8 days   Consultants:  n  Procedures:  n  Antimicrobials:  n  Interval history/Subjective:   Pain with urine passage No cp No fever no chills  Objective:  Vitals:  Vitals:   05/10/18 0526 05/10/18 0915  BP: 128/72 126/67  Pulse: (!) 55 71  Resp: 16 18  Temp: 98.5 F (36.9 C) 98.2 F (36.8 C)  SpO2: 99% 98%    Exam:  Exam unchanged from prior  Awake alert oreineted eomi arcus sinilis cta b  abd soft nt nd no rebound no guard-tip of penis has denudation of the skin without any erythema or purulence Neuro intact-power 5/5, smile symm, reflexes 2/3   I have personally reviewed the following:   Labs: No labs today  Scheduled Meds: . amLODipine  10 mg Oral Daily  . aspirin EC  81 mg Oral Daily  . cloNIDine  0.1 mg Oral BID  . clopidogrel  75 mg Oral Daily  . folic acid  1 mg Oral Daily  . heparin  5,000 Units Subcutaneous Q8H  . hydrocortisone valerate cream   Topical BID  . levETIRAcetam  500 mg Oral BID  . levothyroxine  112 mcg Oral Q24H  . memantine  10 mg Oral BID  . pantoprazole  40 mg Oral Daily   Continuous Infusions:  Principal Problem:   Acute kidney injury superimposed on chronic kidney disease ([place]) Active Problems:   Hypothyroidism   HTN (hypertension)   Seizure disorder (HCC)   Hypokalemia   Dementia ([place])   Prostate cancer ([place])   Lung metastases ([place])  Acute-on-chronic kidney injury ([place])   Pressure injury of skin   Hypotension   LOS: 8 days

## 2018-05-11 MED ORDER — LIDOCAINE 4 % EX CREA
TOPICAL_CREAM | Freq: Three times a day (TID) | CUTANEOUS | Status: DC | PRN
  Administered 2018-05-11 – 2018-05-13 (×3): 1 via TOPICAL
  Filled 2018-05-11: qty 5

## 2018-05-11 NOTE — Progress Notes (Signed)
Checked appeal status at [email or web address] it is still under review.

## 2018-05-11 NOTE — Progress Notes (Signed)
No changes, nearing discharge when bed is available if not will be going home Adding lidocaine gel to tip of penis given denudation-can continue hydrocortisone cream in between  No charge

## 2018-05-12 NOTE — Progress Notes (Signed)
CSW contacted [facility], [place]. She stated that their medical director attempted to contact hospital MD several times with no response, so they closed the case. She provided CSW with an expedited appeal number [id number] #[id number]). CSW completed appeal and faxed in updated clinicals for review.   Percell Locus Koral Thaden LCSW [phone]

## 2018-05-12 NOTE — Progress Notes (Signed)
[facility] PROGRESS NOTE  [name] GEZ:[id number] DOB: [birth year] DOA: 05/02/2018 PCP: [name], MD   Narrative: 84 year old male History of TURP 1992 and XRT to prostate with follow-up with Dr. Hart Rochester in the outpatient setting TIA 6/50/3546 Chronic diastolic heart failure EF 60-65% Hypothyroidism Mild dementia Seizure disorder HTN mild dementia Esophageal stricture GE junction with Candida esophagitis treated in the past Recent admission 11/20-04/2518 with lower extremity bilateral edema found to be slightly short of breath with a left pleural effusion-this turned out to be malignant left pleural effusion consistent with adenocarcinoma CKD 3 as well  Readmitted any pain 05/02/2018 with low blood pressures liquid stools clear watery diarrhea and bump in creatinine from 1.6-3.2 Diarrhea neg for C. difficile   A & Plan Acute Kidney injury superimposed on chronic kidney disease stage III-resolved- does need continued skilled care and help with mobilization  Penile lesion-has an ulcer likely secondary to condom catheter-no work-up needed at this time not infected--adding low dose Hydrocortisone cream 0.2 % ending on 12/14--adde dlidocaine on 12/9 with good effect  Hypertension-continue amlodipine 10 and clonidine 0.1  Bid-mod controlled-no changes today  Diarrhea C. difficile negative  Hypothyroid-cont synthroid 112 mcg-adjustined meds earlier this admit-needs TSH as an outpatient  Hypotension now resolved  Metastatic prostate cancer with malignant pleural effusion-stable at this time-no other signs or sym of sob  Probable dementia-very mild-orients well-cont namenda 10 bid  Seizure disorder-cont Keppra 500 qds  Stage I pressure ulcer sacrum-stable  Anemia of chronic disease  Dysuria Mild malnutrition-Body mass index is 25.97 kg/m.  Debility-patient unable to get up independently-patient pending decision for skilled nursing placement   DVT  prophylaxis: lovenox  Code Status: full   Family Communication: inpatient   Disposition Plan: unclear-Peer-toperr scheudled    Verlon Au, MD  [facility] Direct contact: [phone] --[employer]  --[email or web address]; password [email or web address]  7PM-7AM contact night coverage as above 05/12/2018, 10:19 AM  LOS: 10 days   Consultants:  n  Procedures:  n  Antimicrobials:  n  Interval history/Subjective:  Pain is less no other c/o  Objective:  Vitals:  Vitals:   05/11/18 2019 05/12/18 0900  BP: 122/66 133/73  Pulse: (!) 58 67  Resp: 18 18  Temp: 98.4 F (36.9 C) 98 F (36.7 C)  SpO2: 96% 100%    Exam:  Exam unchanged from prior  Awake alert oreineted eomi arcus sinilis cta b  abd soft nt nd no rebound no guard-tip of penis has denudation of the skin without any erythema or purulence Neuro intact-power 5/5, smile symm, reflexes 2/3   I have personally reviewed the following:   Labs: No labs today  Scheduled Meds: . amLODipine  10 mg Oral Daily  . aspirin EC  81 mg Oral Daily  . cloNIDine  0.1 mg Oral BID  . clopidogrel  75 mg Oral Daily  . feeding supplement (ENSURE ENLIVE)  237 mL Oral Q24H  . folic acid  1 mg Oral Daily  . heparin  5,000 Units Subcutaneous Q8H  . hydrocortisone valerate cream   Topical BID  . levETIRAcetam  500 mg Oral BID  . levothyroxine  112 mcg Oral Q24H  . memantine  10 mg Oral BID  . pantoprazole  40 mg Oral Daily   Continuous Infusions:  Principal Problem:   Acute kidney injury superimposed on chronic kidney disease ([place]) Active Problems:   Hypothyroidism   HTN (hypertension)   Seizure disorder ([facility])   Hypokalemia   Dementia ([place])  Prostate cancer ([place])   Lung metastases ([place])   Acute-on-chronic kidney injury ([place])   Pressure injury of skin   Hypotension   LOS: 10 days

## 2018-05-12 NOTE — Care Management (Addendum)
1559 05-12-18 CM did speak with patient/family-clinical sent via CSW to [facility]. Plan will either be SNF vs Home with [facility]. CMS Medicare list provided to patient/family if goes home, will go with daughter to [facility]. Will continue to follow. Bethena Roys, RN,BSN Case Manager [phone] MD has tried to call [facility] twice today and has not been successful. CM did ask CSW to speak with [facility]- to contact [facility]. Family is aware that Judithann Graves has completed determination. CM discussed home vs SNF. Patient has used [facility] in the past and wants to utilize again. CM did discuss the need for personal care services in the home as well. Family is aware that it is an out of pocket cost. CM will continue to follow. Bethena Roys, RN,BSN Case Manager [phone]    1352 05-12-18 [place] Physician agreed with the notice of discharge. Beneficiary is liable after noon on 05-13-18. CSW to assist with transition of care needs to SNF. CM will continue to monitor for transition of care needs. Bethena Roys, RN,BSN Case Manager [phone]

## 2018-05-13 LAB — CBC WITH DIFFERENTIAL/PLATELET
Abs Immature Granulocytes: 0.14 10*3/uL — ABNORMAL HIGH (ref 0.00–0.07)
Basophils Absolute: 0.1 10*3/uL (ref 0.0–0.1)
Basophils Relative: 1 %
Eosinophils Absolute: 0.4 10*3/uL (ref 0.0–0.5)
Eosinophils Relative: 5 %
HCT: 32.3 % — ABNORMAL LOW (ref 39.0–52.0)
Hemoglobin: 10.4 g/dL — ABNORMAL LOW (ref 13.0–17.0)
Immature Granulocytes: 2 %
Lymphocytes Relative: 24 %
Lymphs Abs: 2.1 10*3/uL (ref 0.7–4.0)
MCH: 27.7 pg (ref 26.0–34.0)
MCHC: 32.2 g/dL (ref 30.0–36.0)
MCV: 86.1 fL (ref 80.0–100.0)
Monocytes Absolute: 0.7 10*3/uL (ref 0.1–1.0)
Monocytes Relative: 8 %
NRBC: 0 % (ref 0.0–0.2)
Neutro Abs: 5.3 10*3/uL (ref 1.7–7.7)
Neutrophils Relative %: 60 %
Platelets: UNDETERMINED 10*3/uL (ref 150–400)
RBC: 3.75 MIL/uL — ABNORMAL LOW (ref 4.22–5.81)
RDW: 13.2 % (ref 11.5–15.5)
WBC: 8.7 10*3/uL (ref 4.0–10.5)

## 2018-05-13 LAB — RENAL FUNCTION PANEL
Albumin: 2.9 g/dL — ABNORMAL LOW (ref 3.5–5.0)
Anion gap: 12 (ref 5–15)
BUN: 34 mg/dL — AB (ref 8–23)
CO2: 24 mmol/L (ref 22–32)
Calcium: 9.3 mg/dL (ref 8.9–10.3)
Chloride: 102 mmol/L (ref 98–111)
Creatinine, Ser: 1.72 mg/dL — ABNORMAL HIGH (ref 0.61–1.24)
GFR calc Af Amer: 38 mL/min — ABNORMAL LOW (ref >60)
GFR calc non Af Amer: 33 mL/min — ABNORMAL LOW (ref >60)
Glucose, Bld: 104 mg/dL — ABNORMAL HIGH (ref 70–99)
Phosphorus: 2.7 mg/dL (ref 2.5–4.6)
Potassium: 5 mmol/L (ref 3.5–5.1)
SODIUM: 138 mmol/L (ref 135–145)

## 2018-05-13 MED ORDER — FOLIC ACID 1 MG PO TABS: 1.0000 mg | ORAL_TABLET | Freq: Every day | ORAL | 0 refills | Status: AC

## 2018-05-13 MED ORDER — HYDROCORTISONE VALERATE 0.2 % EX CREA: TOPICAL_CREAM | Freq: Two times a day (BID) | CUTANEOUS | 0 refills | Status: AC

## 2018-05-13 MED ORDER — LIDOCAINE 4 % EX CREA: TOPICAL_CREAM | Freq: Three times a day (TID) | CUTANEOUS | 0 refills | Status: AC | PRN

## 2018-05-13 MED ORDER — LEVETIRACETAM 500 MG PO TABS: 500.0000 mg | ORAL_TABLET | Freq: Two times a day (BID) | ORAL | 0 refills | Status: AC

## 2018-05-13 NOTE — Progress Notes (Signed)
Occupational Therapy Treatment Patient Details Name: [name] MRN: [id number] DOB: 26-Sep-1920 Today's Date: 05/13/2018    History of present illness Pt is a 84 y/o male admitted secondary to low BP.Metastatic prostate cancer with malignant pleural effusion-stable at this time. Also found to ave AKI superimposed on CKD. PMH includes HTN, prostate cancer, dementia, CVA, and CKD.    OT comments  I have discussed the patient's current level of function related to min (A) with RW  with the patient and daughter.  They acknowledge understanding of this and feel that SNF d/c is the best fit for the patient. Pt currently requires incr time and effort to complete all adls. Pt with DOE and coughing after completion of breakfast. Daughter reports no coughing during meal. Daughter with questions regarding home setup and OT answering concerns this session.    Follow Up Recommendations  Supervision/Assistance - 24 hour;SNF    Equipment Recommendations  Tub/shower bench    Recommendations for Other Services Other (comment)(palliative)    Precautions / Restrictions Precautions Precautions: Fall       Mobility Bed Mobility Overal bed mobility: Needs Assistance Bed Mobility: Supine to Sit Rolling: Supervision   Supine to sit: Supervision     General bed mobility comments: pt requires incr time to progress to eob and increase time. pt with heavy use of bed rail and mod cues to sequence  Transfers Overall transfer level: Needs assistance Equipment used: Rolling walker (2 wheeled) Transfers: Sit to/from Stand Sit to Stand: Min guard Stand pivot transfers: Min assist       General transfer comment: pt requires counting to sequence sit<>Stand and (A) to power up     Balance Overall balance assessment: Needs assistance         Standing balance support: Bilateral upper extremity supported;During functional activity Standing balance-Leahy Scale: Poor                             ADL either performed or assessed with clinical judgement   ADL Overall ADL's : Needs assistance/impaired Eating/Feeding: Independent Eating/Feeding Details (indicate cue type and reason): sitting in bed finishing breakfast Grooming: Wash/dry face;Minimal assistance;Standing Grooming Details (indicate cue type and reason): pt requires (A) for balance and steady at static standing                 Toilet Transfer: Minimal assistance;RW     Toileting - Clothing Manipulation Details (indicate cue type and reason): pt keeping urinal between legs due to incontinence. discussed with daughter the possible need for pull up at home if denied snf     Functional mobility during ADLs: Rolling walker;Minimal assistance General ADL Comments: pt requires incr time to progress out of bed with mod cues. pt requires (A) to power up from bed surface. pt needed cues for detail to hygiene.     Vision       Perception     Praxis      Cognition Arousal/Alertness: Awake/alert Behavior During Therapy: WFL for tasks assessed/performed Overall Cognitive Status: History of cognitive impairments - at baseline                                 General Comments: hx of dementia        Exercises     Shoulder Instructions       General Comments incr risk for skin break down  with prolonged sitting    Pertinent Vitals/ Pain       Pain Assessment: No/denies pain  Home Living                                          Prior Functioning/Environment              Frequency  Min 2X/week        Progress Toward Goals  OT Goals(current goals can now be found in the care plan section)  Progress towards OT goals: Progressing toward goals  Acute Rehab OT Goals Patient Stated Goal: family requesting SNF Time For Goal Achievement: 05/19/18 Potential to Achieve Goals: Good ADL Goals Pt Will Perform Grooming: with supervision;standing Pt Will  Perform Lower Body Bathing: with supervision;sit to/from stand Pt Will Perform Lower Body Dressing: with supervision;sit to/from stand Pt Will Transfer to Toilet: with supervision;bedside commode;ambulating Pt Will Perform Toileting - Clothing Manipulation and hygiene: with supervision;sit to/from stand Pt Will Perform Tub/Shower Transfer: tub bench;rolling walker  Plan Discharge plan remains appropriate    Co-evaluation                 AM-PAC OT "6 Clicks" Daily Activity     Outcome Measure   Help from another person eating meals?: None Help from another person taking care of personal grooming?: A Little Help from another person toileting, which includes using toliet, bedpan, or urinal?: A Little Help from another person bathing (including washing, rinsing, drying)?: A Little Help from another person to put on and taking off regular upper body clothing?: A Little Help from another person to put on and taking off regular lower body clothing?: A Little 6 Click Score: 19    End of Session Equipment Utilized During Treatment: Rolling walker;Gait belt  OT Visit Diagnosis: Unsteadiness on feet (R26.81);Muscle weakness (generalized) (M62.81)   Activity Tolerance Patient tolerated treatment well   Patient Left in chair;with call bell/phone within reach;with family/visitor present;with chair alarm set   Nurse Communication Mobility status;Precautions        Time: 4695-0722 OT Time Calculation (min): 24 min  Charges: OT General Charges $OT Visit: 1 Visit OT Treatments $Self Care/Home Management : 23-37 mins   Jeri Modena, OTR/L  Acute Rehabilitation Services Pager: [phone] Office: [phone] .    Jeri Modena 05/13/2018, 9:23 AM

## 2018-05-13 NOTE — Discharge Summary (Signed)
Physician Discharge Summary  [name] NLG:[id number] DOB: 1921-01-04 DOA: 05/02/2018  PCP: [name], MD  Admit date: 05/02/2018 Discharge date: 05/13/2018  Time spent: 26 minutes  Recommendations for Outpatient Follow-up:  1. multiple antihypertensive medications discontinued 2. Needs Chem-7 in about 1 week 3. Would recommend continuation lidocaine and hydrocortisone to the penis for 1 to 2 weeks and follow-up outpatient if no better 4. Recommend TSH on discharge home in 1 month  Discharge Diagnoses:  Principal Problem:   Acute kidney injury superimposed on chronic kidney disease ([place]) Active Problems:   Hypothyroidism   HTN (hypertension)   Seizure disorder ([place])   Hypokalemia   Dementia ([place])   Prostate cancer ([place])   Lung metastases ([place])   Acute-on-chronic kidney injury ([place])   Pressure injury of skin   Hypotension   Discharge Condition: Improved  Diet recommendation: Heart healthy  Filed Weights   05/11/18 0343 05/11/18 2208 05/12/18 0143  Weight: 72.4 kg 82.1 kg 82.1 kg   History of present illness:  84 year old male History of TURP 1992 and XRT to prostate with follow-up with Dr. Alinda Money in the outpatient setting TIA 0/81/4481 Chronic diastolic heart failure EF 60-65% Hypothyroidism Mild dementia Seizure disorder HTN mild dementia Esophageal stricture GE junction with Candida esophagitis treated in the past Recent admission 11/20-04/2518 with lower extremity bilateral edema found to be slightly short of breath with a left pleural effusion-this turned out to be malignant left pleural effusion consistent with adenocarcinoma CKD 3 as well  Readmitted any pain 05/02/2018 with low blood pressures liquid stools clear watery diarrhea and bump in creatinine from 1.6-3.2 Diarrhea neg for C. difficile  Hospital Course:  Acute Kidney injury superimposed on chronic kidney disease stage III-secondary likely to diarrheal illness superimposed on multi  antihypertensive agents resolved-requiring help and has been qualified by therapy for rehab facility on discharge  Penile lesion-has an ulcer likely secondary to condom catheter-no work-up needed at this time not infected--adding low dose Hydrocortisone cream 0.2 % ending on 12/14--adde dlidocaine on 12/9 with good effect he should continue this for 2 to 3 weeks post discharge  Hypertension-continue amlodipine 10 and clonidine 0.1  Bid-mod controlled-no changes today  Diarrhea C. difficile negative  Hypothyroid-cont synthroid 112 mcg-adjustined meds earlier this admit-needs TSH as an outpatient  Hypotension now resolved  Metastatic prostate cancer with malignant pleural effusion-stable at this time-no other signs or sym of sob  Probable dementia-very mild-orients well-cont namenda 10 bid  Seizure disorder-cont Keppra 500 qds  Stage I pressure ulcer sacrum-stable  Anemia of chronic disease  Dysuria Mild malnutrition-Body mass index is 25.97 kg/m.   Procedures:  n   Consultations:  n  Discharge Exam: Vitals:   05/13/18 0536 05/13/18 0935  BP: 130/68 110/65  Pulse: (!) 54 67  Resp: 17 18  Temp: (!) 97.3 F (36.3 C) 98.1 F (36.7 C)  SpO2: 100% 100%    General: Alert pleasant no distress some pain in the penis but overall is improved Cardiovascular: S1-S2 no murmur rub or gallop Respiratory: Clinically clear no added sound Abdomen soft nontender no rebound no guarding  Discharge Instructions   Discharge Instructions    Diet - low sodium heart healthy   Complete by:  As directed    Increase activity slowly   Complete by:  As directed      Allergies as of 05/13/2018      Reactions   Aspirin    REACTION: "elevated pulse". Cannot take any strength higher than 81mg   Medication List    STOP taking these medications   hydrochlorothiazide 25 MG tablet Commonly known as:  HYDRODIURIL   losartan 50 MG tablet Commonly known as:  COZAAR    potassium chloride 10 MEQ tablet Commonly known as:  K-DUR     TAKE these medications   amLODipine 10 MG tablet Commonly known as:  NORVASC Take 1 tablet (10 mg total) by mouth daily.   aspirin EC 81 MG tablet Take 81 mg by mouth daily.   cloNIDine 0.1 MG tablet Commonly known as:  CATAPRES Take 0.1 mg by mouth 2 (two) times daily.   clopidogrel 75 MG tablet Commonly known as:  PLAVIX Take 75 mg by mouth daily.   folic acid 1 MG tablet Commonly known as:  FOLVITE Take 1 tablet (1 mg total) by mouth daily.   furosemide 20 MG tablet Commonly known as:  LASIX Take 1 tablet (20 mg total) by mouth daily.   hydrocortisone valerate cream 0.2 % Commonly known as:  WESTCORT Apply topically 2 (two) times daily.   levETIRAcetam 500 MG tablet Commonly known as:  KEPPRA Take 1 tablet (500 mg total) by mouth 2 (two) times daily. What changed:    medication strength  how much to take   levothyroxine 112 MCG tablet Commonly known as:  SYNTHROID, LEVOTHROID Take 112 mcg by mouth daily before breakfast.   lidocaine 4 % cream Commonly known as:  LMX Apply topically 3 (three) times daily as needed (Pain at site).   LORazepam 0.5 MG tablet Commonly known as:  ATIVAN Take 1 tablet (0.5 mg total) by mouth 2 (two) times daily as needed for anxiety.   memantine 10 MG tablet Commonly known as:  NAMENDA Take 10 mg by mouth 2 (two) times daily.   omeprazole 20 MG capsule Commonly known as:  PRILOSEC Take 20 mg by mouth daily.   ondansetron 4 MG tablet Commonly known as:  ZOFRAN Take 1 tablet (4 mg total) by mouth every 8 (eight) hours as needed for nausea or vomiting.      Allergies  Allergen Reactions  . Aspirin     REACTION: "elevated pulse". Cannot take any strength higher than 81mg     Contact information for follow-up providers    [facility] Follow [facility].   Specialty:  Home Health Services Why:  Registered Nurse, Physical Therapy, Aide, Research officer, political party.  Contact information: [address] [place] [address] [phone]            Contact information for after-discharge care    Destination    [facility] Preferred SNF .   Service:  Skilled Nursing Contact information: [address] [place] [phone]                   The results of significant diagnostics from this hospitalization (including imaging, microbiology, ancillary and laboratory) are listed below for reference.    Significant Diagnostic Studies: Dg Chest 1 View  Result Date: 04/24/2018 CLINICAL DATA:  Post LEFT thoracentesis EXAM: CHEST  1 VIEW COMPARISON:  04/23/2018 FINDINGS: Mild decrease in LEFT pleural effusion and atelectasis post thoracentesis. Persistent moderate LEFT pleural effusion and atelectasis. No pneumothorax following thoracentesis. Stable heart size. RIGHT lung clear. Small nodular focus identified in the LEFT mid lung, at 7 mm diameter, question pulmonary nodule versus sclerotic focus within the anterior LEFT fourth rib. Bones demineralized with BILATERAL chronic rotator cuff tears. IMPRESSION: Decrease in LEFT pleural effusion and  basilar atelectasis post thoracentesis without pneumothorax. Question 7 mm LEFT mid lung nodule versus sclerotic focus in the anterior LEFT fourth rib; recommend follow-up CT chest assessment. Electronically Signed   By: Lavonia Dana M.D.   On: 04/24/2018 12:36   Dg Chest 2 View  Result Date: 05/02/2018 CLINICAL DATA:  Cough and hypotension today. History of prostate cancer. EXAM: CHEST - 2 VIEW COMPARISON:  CT chest 04/25/2018. Single-view of the chest 04/24/2018. FINDINGS: Small to moderate left pleural effusion has decreased in size since the prior examination. There is compressive atelectasis in the left lung base. The right lung is clear. No right effusion. No pneumothorax. Scattered sclerotic bony lesions consistent with metastatic prostate carcinoma  noted. IMPRESSION: Small to moderate left pleural effusion has decreased in size since the most recent examination. No new abnormality. Metastatic prostate carcinoma. Electronically Signed   By: Inge Rise M.D.   On: 05/02/2018 16:11   Dg Chest 2 View  Result Date: 04/20/2018 CLINICAL DATA:  Lower leg swelling x2 weeks EXAM: CHEST - 2 VIEW COMPARISON:  01/11/2014 FINDINGS: Moderate to large left pleural effusion. Associated lower lobe atelectasis. Right lung is clear. The heart is normal in size. Sclerotic foci involving the right acromion and at least two left ribs, possibly reflecting osseus metastases. IMPRESSION: Moderate to large left pleural effusion. Possible sclerotic osseous metastases, better evaluated on prior bone scan. Electronically Signed   By: Julian Hy M.D.   On: 04/20/2018 10:55   Dg Chest Port 1 View  Result Date: 04/23/2018 CLINICAL DATA:  Bilateral lower extremity swelling for 2 weeks EXAM: PORTABLE CHEST 1 VIEW COMPARISON:  04/20/2018 FINDINGS: Moderate left pleural effusion with atelectasis. No right pleural effusion. No pneumothorax. Stable cardiomediastinal silhouette. No acute osseous abnormality. IMPRESSION: Moderate left pleural effusion with atelectasis. No significant interval change compared with the prior exam. Electronically Signed   By: Kathreen Devoid   On: 04/23/2018 19:34   Vas [place] Lower Extremity Venous (dvt) (mc And Wl 7a-7p)  Result Date: 04/24/2018  Lower Venous Study Indications: Swelling.  Performing Technologist: Maudry Mayhew MHA, RDMS, RVT, RDCS  Examination Guidelines: A complete evaluation includes B-mode imaging, spectral Doppler, color Doppler, and power Doppler as needed of all accessible portions of each vessel. Bilateral testing is considered an integral part of a complete examination. Limited examinations for reoccurring indications may be performed as noted.  Right Venous Findings:  +---------+---------------+---------+-----------+----------+-------------------+          CompressibilityPhasicitySpontaneityPropertiesSummary             +---------+---------------+---------+-----------+----------+-------------------+ CFV      Full           Yes      Yes                                      +---------+---------------+---------+-----------+----------+-------------------+ SFJ      Full                                                             +---------+---------------+---------+-----------+----------+-------------------+ FV Prox  Full                                                             +---------+---------------+---------+-----------+----------+-------------------+  FV Mid   Full                                                             +---------+---------------+---------+-----------+----------+-------------------+ FV DistalFull                                                             +---------+---------------+---------+-----------+----------+-------------------+ PFV      Full                                                             +---------+---------------+---------+-----------+----------+-------------------+ POP      Full           Yes      Yes                                      +---------+---------------+---------+-----------+----------+-------------------+ PTV      Full                                                             +---------+---------------+---------+-----------+----------+-------------------+ PERO                                                  Unable to                                                                 adequately                                                                visualize           +---------+---------------+---------+-----------+----------+-------------------+  Left Venous Findings:  +---------+---------------+---------+-----------+----------+-------------------+          CompressibilityPhasicitySpontaneityPropertiesSummary             +---------+---------------+---------+-----------+----------+-------------------+ CFV      Full           Yes      Yes                                      +---------+---------------+---------+-----------+----------+-------------------+  SFJ      Full                                                             +---------+---------------+---------+-----------+----------+-------------------+ FV Prox  Full                                                             +---------+---------------+---------+-----------+----------+-------------------+ FV Mid   Full                                                             +---------+---------------+---------+-----------+----------+-------------------+ FV DistalFull                                                             +---------+---------------+---------+-----------+----------+-------------------+ PFV      Full                                                             +---------+---------------+---------+-----------+----------+-------------------+ POP      Full           Yes      Yes                                      +---------+---------------+---------+-----------+----------+-------------------+ PTV      Full                                                             +---------+---------------+---------+-----------+----------+-------------------+ PERO                                                  Unable to                                                                 adequately  visualize           +---------+---------------+---------+-----------+----------+-------------------+    Summary: Right: There is no evidence of deep vein thrombosis in the lower  extremity. However, portions of this examination were limited- see technologist comments above. No cystic structure found in the popliteal fossa. Left: There is no evidence of deep vein thrombosis in the lower extremity. However, portions of this examination were limited- see technologist comments above. No cystic structure found in the popliteal fossa.  *See table(s) above for measurements and observations. Electronically signed by Monica Martinez MD on 04/24/2018 at 3:35:32 PM.    Final    Ir Thoracentesis Asp Pleural Space W/img Guide  Result Date: 04/24/2018 INDICATION: History of prostate cancer. Lower extremity edema. Left pleural effusion. Request for diagnostic and therapeutic thoracentesis. EXAM: ULTRASOUND GUIDED LEFT THORACENTESIS MEDICATIONS: 1% lidocaine 10 mL COMPLICATIONS: None immediate. PROCEDURE: An ultrasound guided thoracentesis was thoroughly discussed with the patient and questions answered. The benefits, risks, alternatives and complications were also discussed. The patient understands and wishes to proceed with the procedure. Written consent was obtained. Ultrasound was performed to localize and mark an adequate pocket of fluid in the left chest. The area was then prepped and draped in the normal sterile fashion. 1% Lidocaine was used for local anesthesia. Under ultrasound guidance a 6 Fr Safe-T-Centesis catheter was introduced. Thoracentesis was performed. The catheter was removed and a dressing applied. FINDINGS: A total of approximately 1.2 L of clear yellow fluid was removed. Samples were sent to the laboratory as requested by the clinical team. IMPRESSION: Successful ultrasound guided left thoracentesis yielding 1.2 L of pleural fluid. Postprocedure chest x-ray shows a decrease in the left pleural effusion without pneumothorax and question 7 mm left mid lung nodule versus sclerotic focus in the anterior left fourth rib; recommend follow-up CT chest assessment. Read by: Gareth Eagle, PA-C Electronically Signed   By: Markus Daft M.D.   On: 04/24/2018 13:43   Ct Chest Nodule Follow Up Low Dose W/o  Result Date: 04/26/2018 CLINICAL DATA:  84 year old male with history of pulmonary nodule. Followup study. EXAM: CT CHEST WITHOUT CONTRAST TECHNIQUE: Multidetector CT imaging of the chest was performed following the standard protocol without IV contrast. COMPARISON:  No priors.  Chest x-ray 04/24/2018. FINDINGS: Cardiovascular: Heart size is normal. There is no significant pericardial fluid, thickening or pericardial calcification. There is aortic atherosclerosis, as well as atherosclerosis of the great vessels of the mediastinum and the coronary arteries, including calcified atherosclerotic plaque in the left anterior descending and left circumflex coronary arteries. Ectasia of ascending thoracic aorta (4.2 cm in diameter). Ectasia of proximal descending thoracic aorta measuring up to 4.2 cm in diameter also noted. Mediastinum/Nodes: No pathologically enlarged mediastinal or hilar lymph nodes. Please note that accurate exclusion of hilar adenopathy is limited on noncontrast CT scans. Esophagus is unremarkable in appearance. No axillary lymphadenopathy. Lungs/Pleura: The previously suspected pulmonary nodule appears to correspond to a sclerotic rib lesion. No definite suspicious appearing pulmonary nodules or masses are noted. The only pulmonary nodule is in the right middle lobe measuring 3 mm (axial image 182 of series 3). Moderate left pleural effusion. Patchy areas of ground-glass attenuation and septal thickening are noted in the left lung. Right lung is well aerated. Upper Abdomen: Aortic atherosclerosis. Musculoskeletal: Widespread sclerotic lesions throughout the visualized axial and appendicular skeleton, indicative of metastatic disease to the bones, largest of which is in the right scapula measuring up to 2.8 cm (axial image 10 of series 3). IMPRESSION:  1. No definite suspicious  pulmonary nodules or masses. 2. Sclerotic lesions throughout the visualized axial and appendicular skeleton, indicative of widespread metastatic disease to the bones. Correlation with PSA level is recommended. 3. Moderate left pleural effusion. Patchy multifocal ground-glass attenuation and interstitial prominence in the left lung may suggest multilobar bronchopneumonia, or may be chronic. 4. Aortic atherosclerosis, in addition to left anterior descending and left circumflex coronary artery disease. 5. There is also ectasia of the ascending and proximal descending thoracic aorta which measures up to 4.2 cm in diameter. Aortic Atherosclerosis (ICD10-I70.0). Electronically Signed   By: Vinnie Langton M.D.   On: 04/26/2018 16:20    Microbiology: Recent Results (from the past 240 hour(s))  Urine Culture     Status: None   Collection Time: 05/05/18  1:36 AM  Result Value Ref Range Status   Specimen Description URINE, RANDOM  Final   Special Requests   Final    NONE Performed at [facility], [address], [place], [place] [address]    Culture   Final    Multiple bacterial morphotypes present, none predominant. Suggest appropriate recollection if clinically indicated.   Report Status 05/07/2018 FINAL  Final     Labs: Basic Metabolic Panel: Recent Labs  Lab 05/07/18 0422 05/09/18 0525 05/13/18 0408  NA 139 139 138  K 4.1 4.3 5.0  CL 109 105 102  CO2 23 26 24   GLUCOSE 105* 111* 104*  BUN 20 25* 34*  CREATININE 1.56* 1.75* 1.72*  CALCIUM 9.0 9.3 9.3  PHOS  --   --  2.7   Liver Function Tests: Recent Labs  Lab 05/13/18 0408  ALBUMIN 2.9*   No results for input(s): LIPASE, AMYLASE in the last 168 hours. No results for input(s): AMMONIA in the last 168 hours. CBC: Recent Labs  Lab 05/07/18 0422 05/09/18 0525 05/13/18 0408  WBC  --  9.8 8.7  NEUTROABS  --  6.3 5.3  HGB 8.4* 9.8* 10.4*  HCT 26.8* 32.1* 32.3*  MCV  --  89.9 86.1  PLT  --  159 PLATELET CLUMPS NOTED ON  SMEAR, UNABLE TO ESTIMATE   Cardiac Enzymes: No results for input(s): CKTOTAL, CKMB, CKMBINDEX, TROPONINI in the last 168 hours. BNP: BNP (last 3 results) Recent Labs    04/20/18 1003 04/23/18 1950  BNP 46.0 18.3    ProBNP (last 3 results) No results for input(s): PROBNP in the last 8760 hours.  CBG: No results for input(s): GLUCAP in the last 168 hours.     Signed:  Nita Sells MD   [facility] 05/13/2018, 10:25 AM

## 2018-05-13 NOTE — Care Management Note (Addendum)
Case Management Note  Patient Details  Name: [name] MRN: [id number] Date of Birth: 1920-07-24  Subjective/Objective: Pt presented for AKI and Hypertension. PTA from home with the support of his niece. PT recommendation for SNF- family wanted [facility]- Insurance initially denied and asked for additional clinicals and [facility] to [facility]. CSW [place] sent additional clinicals and called [facility] on 05-12-18. CSW [employer] with Liaison for [employer] and [place] states it may take up to 72 hours for approval vs disapproval.                 Action/Plan: [employer] appeal completed on 05-12-18. Financial Liability Determined -The Physician agreed with the notice of discharge. Beneficiary is liable after noon on 05-13-18. CMS Medicare list provided to patient/family. Patient was previously active with [facility] before admission and can follow in [place]. No DME needed at this time. Patient has Hospital Bed, [place], [place], [facility], Shower chair. PT can reassess for any DME needs once patient transitions home. Butch Penny with [facility] aware that patient will transition home today. No further needs from CM at this time.    Expected Discharge Date:  05/09/18               Expected Discharge Plan:  [place]  In-House Referral:  Clinical Social Work  Discharge planning Services  CM Consult  Post Acute Care Choice:  Home Health, Resumption of Svcs/PTA Provider Choice offered to:  Patient  DME Arranged:  N/A DME Agency:  NA  HH Arranged:  RN, Disease Management, PT, Nurse's Aide, Social Work [employer] Agency:  [place]  Status of Service:  Completed, signed off  If discussed at [employer], dates discussed:    Additional Comments:  Bethena Roys, RN 05/13/2018, 10:04 AM

## 2019-02-03 DEATH — deceased

## 2020-02-19 IMAGING — CR DG CHEST 1V
1 series · 1 of 1 positions shown · non-contrast
Comparison: 04/23/2018

CLINICAL DATA: Post LEFT thoracentesis

EXAM:
CHEST  1 VIEW

[chest ap]
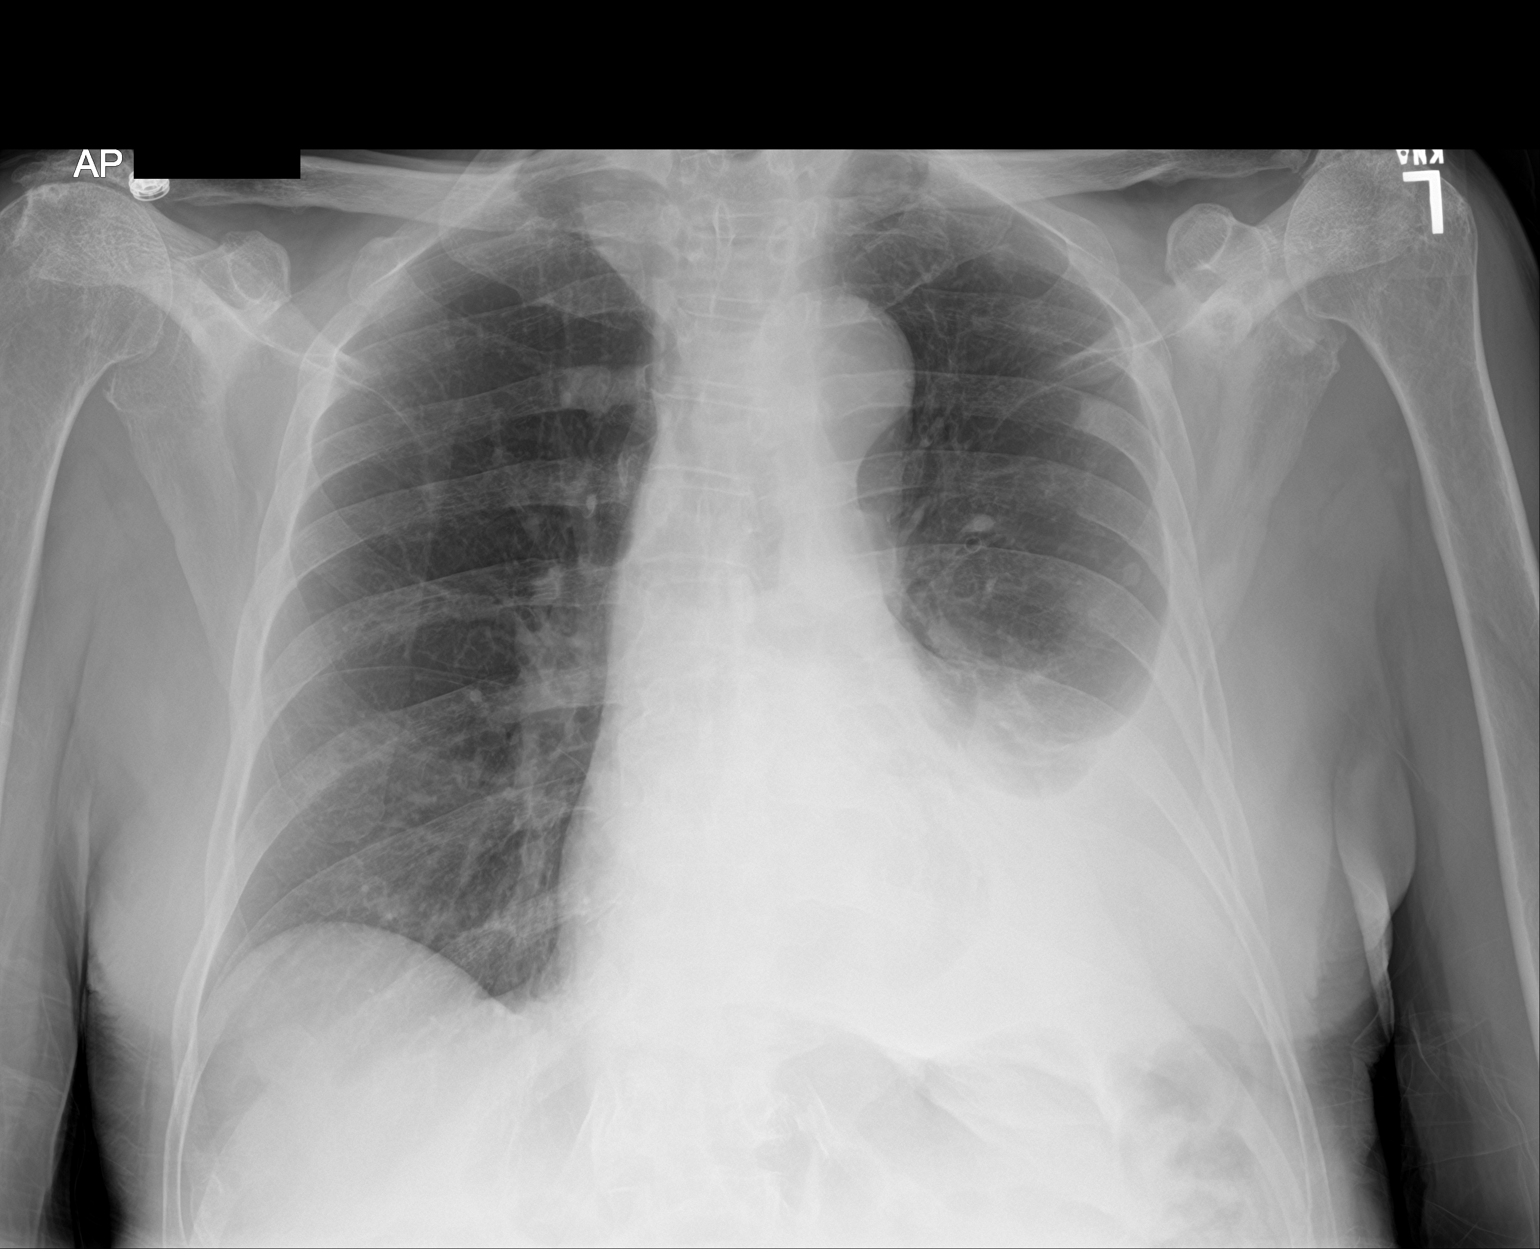

[1 of 1 positions shown; findings below may reference images not displayed]

FINDINGS: Mild decrease in LEFT pleural effusion and atelectasis post
thoracentesis.

Persistent moderate LEFT pleural effusion and atelectasis.

No pneumothorax following thoracentesis.

Stable heart size.

RIGHT lung clear.

Small nodular focus identified in the LEFT mid lung, at 7 mm
diameter, question pulmonary nodule versus sclerotic focus within
the anterior LEFT fourth rib.

Bones demineralized with BILATERAL chronic rotator cuff tears.
IMPRESSION: Decrease in LEFT pleural effusion and basilar atelectasis post
thoracentesis without pneumothorax.

Question 7 mm LEFT mid lung nodule versus sclerotic focus in the
anterior LEFT fourth rib; recommend follow-up CT chest assessment.
